# Patient Record
Sex: Female | Born: 1970 | Race: White | Hispanic: No
Health system: Western US, Academic
[De-identification: ages and names within clinical notes are randomized; demographics above are authoritative.]

## PROBLEM LIST (undated history)

## (undated) DIAGNOSIS — T8859XA Other complications of anesthesia, initial encounter: Secondary | ICD-10-CM

## (undated) DIAGNOSIS — Z5189 Encounter for other specified aftercare: Secondary | ICD-10-CM

## (undated) DIAGNOSIS — S88912A Complete traumatic amputation of left lower leg, level unspecified, initial encounter: Secondary | ICD-10-CM

## (undated) DIAGNOSIS — E079 Disorder of thyroid, unspecified: Secondary | ICD-10-CM

## (undated) DIAGNOSIS — G4733 Obstructive sleep apnea (adult) (pediatric): Secondary | ICD-10-CM

## (undated) DIAGNOSIS — I2699 Other pulmonary embolism without acute cor pulmonale: Secondary | ICD-10-CM

## (undated) DIAGNOSIS — F32A Depression, unspecified: Secondary | ICD-10-CM

## (undated) DIAGNOSIS — I10 Essential (primary) hypertension: Secondary | ICD-10-CM

## (undated) DIAGNOSIS — S88911A Complete traumatic amputation of right lower leg, level unspecified, initial encounter: Secondary | ICD-10-CM

## (undated) DIAGNOSIS — T4145XA Adverse effect of unspecified anesthetic, initial encounter: Secondary | ICD-10-CM

## (undated) DIAGNOSIS — C801 Malignant (primary) neoplasm, unspecified: Secondary | ICD-10-CM

## (undated) DIAGNOSIS — G709 Myoneural disorder, unspecified: Secondary | ICD-10-CM

## (undated) DIAGNOSIS — F329 Major depressive disorder, single episode, unspecified: Secondary | ICD-10-CM

## (undated) DIAGNOSIS — Z87898 Personal history of other specified conditions: Secondary | ICD-10-CM

## (undated) HISTORY — DX: Myoneural disorder, unspecified: G70.9

## (undated) HISTORY — DX: Complete traumatic amputation of left lower leg, level unspecified, initial encounter: S88.912A

## (undated) HISTORY — DX: Obstructive sleep apnea (adult) (pediatric): G47.33

## (undated) HISTORY — DX: Essential (primary) hypertension: I10

## (undated) HISTORY — DX: Other pulmonary embolism without acute cor pulmonale: I26.99

## (undated) HISTORY — PX: BRAIN SURGERY: SHX531

## (undated) HISTORY — DX: Encounter for other specified aftercare: Z51.89

## (undated) HISTORY — DX: Complete traumatic amputation of right lower leg, level unspecified, initial encounter: S88.911A

## (undated) HISTORY — DX: Disorder of thyroid, unspecified: E07.9

## (undated) HISTORY — DX: Personal history of other specified conditions: Z87.898

---

## 2000-07-18 ENCOUNTER — Other Ambulatory Visit: Admission: RE | Admit: 2000-07-18 | Discharge: 2000-07-18 | Payer: Self-pay | Admitting: Family Medicine

## 2001-07-22 ENCOUNTER — Other Ambulatory Visit: Admission: RE | Admit: 2001-07-22 | Discharge: 2001-07-22 | Payer: Self-pay | Admitting: Family Medicine

## 2002-08-11 ENCOUNTER — Other Ambulatory Visit: Admission: RE | Admit: 2002-08-11 | Discharge: 2002-08-11 | Payer: Self-pay | Admitting: Family Medicine

## 2004-03-22 ENCOUNTER — Ambulatory Visit: Payer: Self-pay | Admitting: Family Medicine

## 2004-10-24 ENCOUNTER — Ambulatory Visit: Payer: Self-pay | Admitting: Family Medicine

## 2006-04-08 DIAGNOSIS — Z5189 Encounter for other specified aftercare: Secondary | ICD-10-CM

## 2006-04-08 HISTORY — DX: Encounter for other specified aftercare: Z51.89

## 2006-04-08 HISTORY — PX: ABOVE KNEE LEG AMPUTATION: SUR20

## 2007-04-09 DIAGNOSIS — I2699 Other pulmonary embolism without acute cor pulmonale: Secondary | ICD-10-CM

## 2007-04-09 HISTORY — DX: Other pulmonary embolism without acute cor pulmonale: I26.99

## 2009-04-08 DIAGNOSIS — G4733 Obstructive sleep apnea (adult) (pediatric): Secondary | ICD-10-CM

## 2009-04-08 HISTORY — DX: Obstructive sleep apnea (adult) (pediatric): G47.33

## 2013-04-23 ENCOUNTER — Encounter (HOSPITAL_BASED_OUTPATIENT_CLINIC_OR_DEPARTMENT_OTHER): Payer: Self-pay | Admitting: Otolaryngology

## 2013-04-23 ENCOUNTER — Ambulatory Visit (HOSPITAL_BASED_OUTPATIENT_CLINIC_OR_DEPARTMENT_OTHER): Payer: No Typology Code available for payment source | Attending: Otolaryngology | Admitting: Otolaryngology

## 2013-04-23 VITALS — BP 131/90 | HR 91 | Temp 97.9°F | Resp 16 | Ht 63.0 in | Wt 145.0 lb

## 2013-04-23 DIAGNOSIS — J329 Chronic sinusitis, unspecified: Secondary | ICD-10-CM | POA: Insufficient documentation

## 2013-04-23 DIAGNOSIS — R5381 Other malaise: Secondary | ICD-10-CM

## 2013-04-23 DIAGNOSIS — R5383 Other fatigue: Secondary | ICD-10-CM | POA: Insufficient documentation

## 2013-04-23 NOTE — Progress Notes (Addendum)
Consultation requested by: Calvert Cantor, ARNP  Reason for consultation:  C/f thyroglossal duct cyst  Chief Complaint   Patient presents with   . Throat Problem     thryoglossal lesion     I appreciate the opportunity to be involved in this patient's care.    HISTORY OF PRESENT ILLNESS:  This is a 43 yo female with multiple ENT related complaints.  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  Patient endorses occasional sore throats with very mild dysphagia.   States she has sore throat once every 6-8 wks but they are general mild and non-disruptive in her life.  Further, they resolve spontaneously.      Patient also complains of 9 months of symptoms consistent with obstructive sleep apnea.  She states that she has poor sleep including difficulty falling and staying asleep.  Her husband states that she snores and often has witnessed apneic events.  She states that her sleep is non-restorative.      ROS: positive for right ear fullness with vertigo and occasional pain, posterior neck mass not seen on CT imaging, occasional nasal congestion,       Past Medical History:  Past Medical History   Diagnosis Date   . History of headache        Medications:  No outpatient prescriptions prior to visit.     No facility-administered medications prior to visit.       Social History:  History   Substance Use Topics   . Smoking status: Never Smoker    . Smokeless tobacco: Never Used   . Alcohol Use: Not on file       Family History:  No family history on file.   Otherwise negative in relation to the chief complaint.    I have reviewed the patient's recorded medical history including the ROS, past medical history, past surgical history, family history and social history and confirmed these with the patient.    REVIEW OF SYSTEMS:  A complete review of systems was performed.  Pertinent positives are noted in the HPI above and/or on today's patient questionnaire, with all other systems negative in  relation to the chief complaint.    PHYSICAL EXAMINATION:  Vital Signs:  Blood pressure 131/90, pulse 91, temperature 97.9 F (36.6 C), temperature source Temporal, resp. rate 16, height 5\' 3"  (1.6 m), weight 145 lb (65.772 kg), SpO2 100.00%.  General:  Well-looking individual in no apparent distress.    Eyes:  Pupils equal and reactive to light.  Normal extraocular muscle movements.    ENT:  Tympanic membranes and external auditory canals are normal bilaterally.  Oral cavity and oropharynx examination demonstrates no mucosal lesions or masses.  Anterior rhinoscopy demonstrates normal mucosa and no lesions.  Lymphatic:  No neck adenopathy noted.  Respiratory:  No respiratory distress.  No audible stridor nor wheeze.    Musculoskeletal:  Normocephalic.   Neurologic:  Screening cranial nerve examination for nerves II-XII within normal limits.   Psychiatric:  Patient alert and oriented in all 3 domains.  Appropriate mood and affect.    DIAGNOSTIC STUDIES:  PROCEDURE:  Flexible fiberoptic nasal laryngoscopy  Indication: OSA, TGC  Operator:  Dr. Francene Finders, MD, Dr. Donnamae Jude, MD  Findings:   Middle meati and sphenoethmoidal recesses clear of polyps or pus bilaterally.  No nasopharyngeal, oropharyngeal or hypopharyngeal lesions or masses seen.    Vocal folds are mobile with no vocal fold lesions.  Description of Procedure:  The procedure was explained to the patient who verbally agreed to proceed.  Afrin and lidocaine 4% were used topically for anaesthesia.  The endoscope was passed atraumatically through the nose with the findings as described.  The scope was gently withdrawn.  The patient tolerated the procedure well without complications.  The findings were reviewed with the patient and their questions were answered.    ASSESSMENT:  1. 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  Patient was told that lesion seen on CT may also be consistent with necrotic delphine node  2.  Disruptive Sleep - patient reports 9  months of snoring, non-restorative sleep, witnessed apneic events.  This is quite concerning for OSA.   3. Multiple other benign Heck & Neck complains - includes vertigo, otalgia, nasal congestion, posterior neck mass and dysphagia    Plan:   1. Radiology overread to confirm radiographic lesion is TGC and not necrotic node  2. Refer to sleep medicine for work up of OSA  3. RTC for evaluation of other H&N complaints.    Patient seen and discussed with Dr. Debbrah AlarWhipple    -------------------------------------------  Attending: Cornelia CopaMark Eliot Whipple, MD  I saw and evaluated the patient and agree with Dr. Lamonte RicherLaw's note.   Additional diagnoses: I was present for the entire procedure of flexible fiberoptic nasal endoscopy.  ----------------------------------------

## 2013-04-23 NOTE — Patient Instructions (Addendum)
It was a pleasure to see you today.    To recap, we discussed your thyroglossal duct cyst and previous radiology studies.  We planned to refer you to a sleep doctor and review your images with a radiologist.     Please remember to refer to the handouts you were given to clinic today as needed.    Please feel free to contact the office if you have any questions or concerns.    Also, please feel free to contact us and return sooner if any of your symptoms worsen.    I appreciate the opportunity to be involved in your care.     This note was generated with voice recognition software. While efforts were made correct typographical errors, some errors may have been missed.    ___

## 2013-04-27 ENCOUNTER — Telehealth (HOSPITAL_BASED_OUTPATIENT_CLINIC_OR_DEPARTMENT_OTHER): Payer: Self-pay

## 2013-04-27 NOTE — Telephone Encounter (Signed)
CONFIRMED PHONE NUMBER:   Telephone Information:   Home Phone [phone]   Work Phone [phone]   Mobile [phone]       CALLERS FIRST AND LAST NAME: Christin Fudgeina M Mamula    FACILITY NAME: N/A TITLE: N/a  CALLERS RELATIONSHIP:Self  RETURN CALL: Detailed message on voicemail only     SUBJECT: Appointment Request   REASON FOR REQUEST: per patient    REQUEST APPOINTMENT WITH: any  REFERRING PROVIDER: Dr. Conni ElliotLaw  REQUESTED DATE: 1/23 preferred since she doesn't like close or try and match up with other appointments she has  REQUESTED TIME: any  UNABLE TO APPOINT: Other: no scheduling notes in referral

## 2013-04-28 DIAGNOSIS — Q892 Congenital malformations of other endocrine glands: Secondary | ICD-10-CM

## 2013-04-30 ENCOUNTER — Ambulatory Visit (HOSPITAL_BASED_OUTPATIENT_CLINIC_OR_DEPARTMENT_OTHER): Payer: No Typology Code available for payment source | Attending: Audiologist | Admitting: Audiologist

## 2013-04-30 DIAGNOSIS — R42 Dizziness and giddiness: Secondary | ICD-10-CM

## 2013-04-30 DIAGNOSIS — H9319 Tinnitus, unspecified ear: Secondary | ICD-10-CM | POA: Insufficient documentation

## 2013-04-30 NOTE — Progress Notes (Signed)
REASON FOR VISIT   Hearing evaluation.    SUBJECTIVE  This is an abbreviated note. Jacqueline Terrell, 42 yrs, female, was referred by Dr. Francene FindersMark Whipple, MD. for an audiogram. Please see Dr. Erlene QuanWhipple's note for complete case history.     Hearing loss: No    Tinnitus:Yes, non ear specific, not bothersome but noticeable when quiet  Dizziness / Vertigo / Imbalance: Yes; episodic but increasing in frequency; first episode was severe upon waking in bed 3-4 years ago; also veers to the left at times  Aural Fullness: Yes, left more recently  Otogenic Pain: Sometimes, indicated behind left ear  Family History of Hearing Loss: No    OBJECTIVE/ASSESSMENT  Otoscopic Inspection:   Right Ear: Clear EAC, visualized TM  Left Ear: Clear EAC, visualized TM    Tympanometry:  Right Ear: Normal peak pressure and compliance  Left Ear: Normal peak pressure and compliance     Audiometry: See Epic under media tab for details  Right Ear: Hearing is within normal limits 250-8kHz  Left Ear: Hearing is within normal limits 250-8kHz    Speech Discrimination (Word Recognition):  Right Ear:  100% words correct at 75 dBHL, with contralateral masking  Left Ear:  96% words correct at 75 dBHL, with contralateral masking    PLAN  Patient to be seen by  Dr. Francene FindersMark Whipple, MD. May 07, 2013.  Discussed results with patient and spouse.

## 2013-05-03 ENCOUNTER — Ambulatory Visit: Payer: No Typology Code available for payment source

## 2013-05-07 ENCOUNTER — Ambulatory Visit (HOSPITAL_BASED_OUTPATIENT_CLINIC_OR_DEPARTMENT_OTHER): Payer: No Typology Code available for payment source | Attending: Physician Assistant | Admitting: Otolaryngology

## 2013-05-07 ENCOUNTER — Encounter (HOSPITAL_BASED_OUTPATIENT_CLINIC_OR_DEPARTMENT_OTHER): Payer: Self-pay | Admitting: Otolaryngology

## 2013-05-07 VITALS — BP 126/82 | HR 91 | Temp 97.3°F | Resp 20 | Ht 63.0 in | Wt 157.0 lb

## 2013-05-07 DIAGNOSIS — R42 Dizziness and giddiness: Secondary | ICD-10-CM | POA: Insufficient documentation

## 2013-05-07 DIAGNOSIS — R0989 Other specified symptoms and signs involving the circulatory and respiratory systems: Secondary | ICD-10-CM

## 2013-05-07 DIAGNOSIS — Q892 Congenital malformations of other endocrine glands: Secondary | ICD-10-CM | POA: Insufficient documentation

## 2013-05-07 DIAGNOSIS — R198 Other specified symptoms and signs involving the digestive system and abdomen: Secondary | ICD-10-CM

## 2013-05-07 DIAGNOSIS — F458 Other somatoform disorders: Secondary | ICD-10-CM
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
## 2013-05-13 ENCOUNTER — Encounter (HOSPITAL_BASED_OUTPATIENT_CLINIC_OR_DEPARTMENT_OTHER): Payer: Self-pay | Admitting: Pulmonary Disease

## 2013-05-13 ENCOUNTER — Ambulatory Visit (HOSPITAL_BASED_OUTPATIENT_CLINIC_OR_DEPARTMENT_OTHER): Payer: No Typology Code available for payment source | Attending: Pulmonary Disease | Admitting: Pulmonary Disease

## 2013-05-13 VITALS — BP 124/84 | HR 91 | Temp 96.8°F | Resp 16 | Ht 63.0 in | Wt 155.2 lb

## 2013-05-13 DIAGNOSIS — Q892 Congenital malformations of other endocrine glands: Secondary | ICD-10-CM | POA: Insufficient documentation

## 2013-05-13 DIAGNOSIS — J302 Other seasonal allergic rhinitis: Secondary | ICD-10-CM | POA: Insufficient documentation

## 2013-05-13 DIAGNOSIS — R0609 Other forms of dyspnea: Secondary | ICD-10-CM | POA: Insufficient documentation

## 2013-05-13 DIAGNOSIS — R5381 Other malaise: Secondary | ICD-10-CM | POA: Insufficient documentation

## 2013-05-13 DIAGNOSIS — R0989 Other specified symptoms and signs involving the circulatory and respiratory systems: Secondary | ICD-10-CM | POA: Insufficient documentation

## 2013-05-13 DIAGNOSIS — R5383 Other fatigue: Secondary | ICD-10-CM | POA: Insufficient documentation

## 2013-05-13 DIAGNOSIS — R0683 Snoring: Secondary | ICD-10-CM
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
## 2013-05-13 NOTE — Patient Instructions (Signed)
We will make arrangements for a home sleep study. If this is diagnostic for sleep apnea, we will contact you to make arrangements for a CPAP trial. If it does not show sleep apnea, we would repeat the home test and possibly follow-up with an sleep lab study if needed.

## 2013-05-14 ENCOUNTER — Telehealth (HOSPITAL_BASED_OUTPATIENT_CLINIC_OR_DEPARTMENT_OTHER): Payer: Self-pay | Admitting: Pulmonary Disease

## 2013-05-14 NOTE — Telephone Encounter (Signed)
Forwarded to Sleep Tech.

## 2013-05-14 NOTE — Telephone Encounter (Signed)
CONFIRMED PHONE NUMBER: [phone]  CALLERS FIRST AND LAST NAME: Christin Fudgeina M Alipio  FACILITY NAME: na TITLE: na  CALLERS RELATIONSHIP:Self  RETURN CALL: OK to leave detailed message with anyone that answers     SUBJECT: General Message   REASON FOR REQUEST: Insurance Verification and Pre-Authorizations      MESSAGE:  Patient called to try and verify information, CCR re-ran and it is still showing as inactive. Patient also states she spoke with insurance company and the effective date is 04/08/2013 and that her at home and in office sleep studies need pre-authorizations. Please call back, Thank You!

## 2013-05-14 NOTE — Telephone Encounter (Signed)
Patient returning call. Please follow up with patient. Thank you.

## 2013-05-14 NOTE — Addendum Note (Signed)
Addended by: Francene FindersWHIPPLE, MARK ELIOT on: 05/14/2013 08:52 AM     Modules accepted: Orders, Level of Service

## 2013-05-17 NOTE — Telephone Encounter (Signed)
Called Pt and told her the phone number to pre-reg to see if they could help her with her insurance issue, and told her I would follow up as well. I called Pre[employer] and explained the situation to Wynona CanesChristine who is forwarding Ms Goyne's info to EEV team.

## 2013-05-27 ENCOUNTER — Ambulatory Visit (HOSPITAL_BASED_OUTPATIENT_CLINIC_OR_DEPARTMENT_OTHER): Payer: No Typology Code available for payment source | Attending: Pulmonary Disease

## 2013-05-27 DIAGNOSIS — R0609 Other forms of dyspnea: Secondary | ICD-10-CM | POA: Insufficient documentation

## 2013-05-27 DIAGNOSIS — G4733 Obstructive sleep apnea (adult) (pediatric): Secondary | ICD-10-CM

## 2013-05-27 DIAGNOSIS — R0989 Other specified symptoms and signs involving the circulatory and respiratory systems: Secondary | ICD-10-CM | POA: Insufficient documentation

## 2013-06-03 NOTE — Addendum Note (Signed)
Addended by: Judeth CornfieldBILLINGS, MARTHA ELIZABETH on: 06/03/2013 04:53 PM     Modules accepted: Orders

## 2013-06-08 ENCOUNTER — Telehealth (HOSPITAL_BASED_OUTPATIENT_CLINIC_OR_DEPARTMENT_OTHER): Payer: Self-pay

## 2013-06-08 NOTE — Telephone Encounter (Signed)
CONFIRMED PHONE NUMBER: [phone]  CALLERS FIRST AND LAST NAME: Jacqueline Terrell  FACILITY NAME: / TITLE: /  CALLERS RELATIONSHIP:Self  RETURN CALL: Detailed message on voicemail only     SUBJECT: General Message   REASON FOR REQUEST: Patient called to inform your clinic that she is still waiting for a technician from your clinic to discuss the test results and to set up second Sleep Study test. Please contact patient as soon as possible. Thanks!    MESSAGE: Please see above.

## 2013-06-09 NOTE — Telephone Encounter (Signed)
Call to pt and 05/27/13 HSAT results relayed :  INTERPRETATION:    This unattended portable monitor is not diagnostic of obstructive sleep apnea.      RECOMMENDATIONS:    Given our suspicion for mild OSA/upper airway resistance syndrome, I will continue to evaluate her.  Her insurance mandates another HSAT prior to in-lab.  I suspect this will also be non-diagnostic but this is required.     Pt verbalizes understanding

## 2013-06-16 ENCOUNTER — Ambulatory Visit (HOSPITAL_BASED_OUTPATIENT_CLINIC_OR_DEPARTMENT_OTHER): Payer: No Typology Code available for payment source | Attending: Pulmonary Disease

## 2013-06-16 DIAGNOSIS — R0989 Other specified symptoms and signs involving the circulatory and respiratory systems: Secondary | ICD-10-CM | POA: Insufficient documentation

## 2013-06-16 DIAGNOSIS — R0609 Other forms of dyspnea: Secondary | ICD-10-CM | POA: Insufficient documentation

## 2013-06-16 DIAGNOSIS — G4733 Obstructive sleep apnea (adult) (pediatric): Secondary | ICD-10-CM

## 2013-06-17 ENCOUNTER — Other Ambulatory Visit (HOSPITAL_BASED_OUTPATIENT_CLINIC_OR_DEPARTMENT_OTHER): Payer: Self-pay | Admitting: Pulmonary Disease

## 2013-06-17 DIAGNOSIS — G4733 Obstructive sleep apnea (adult) (pediatric): Secondary | ICD-10-CM

## 2013-06-28 ENCOUNTER — Telehealth (HOSPITAL_BASED_OUTPATIENT_CLINIC_OR_DEPARTMENT_OTHER): Payer: Self-pay

## 2013-06-28 NOTE — Telephone Encounter (Signed)
CONFIRMED PHONE NUMBER:   Telephone Information:   Home Phone Not on file.   Work Phone [phone]   Mobile [phone]       CALLERS FIRST AND LAST NAME: Jacqueline Terrell    FACILITY NAME:   TITLE:    CALLERS RELATIONSHIP:Self  RETURN CALL: OK to leave detailed message with anyone that answers     SUBJECT: General Message   REASON FOR REQUEST: Patient had surgery for ablation at [facility] last week and had concerns about low oxygen level in the 50 when a sleep     MESSAGE: Refer to above

## 2013-06-30 NOTE — Telephone Encounter (Signed)
Call from pt stating she had a tubal ligation at [facility] last week and was kept inpt for 2 additional days due to sleep apnea ." They had me wear a CPAP and said I would want to tell my Dr at the Sleep Clinic ."  Pt states her SPO2 was in the 50s while she slept . " I was on narcotics ,so the last night I was there I didn't take any . They said the last night my sleep alarm went off only 1 time and my husband said I snored like crazy on Sunday night."    Pt is scheduled for a sleep study on 07/17/13. [facility] if [facility] is any sooner availability . Pt rescheduled for tomorrow night. Pt verbalizes understanding and appreciation.   Call placed to [facility] record dept ,records requested,received and forwarded to Dr Troy SineBillings for review.

## 2013-07-01 ENCOUNTER — Ambulatory Visit (HOSPITAL_BASED_OUTPATIENT_CLINIC_OR_DEPARTMENT_OTHER): Payer: No Typology Code available for payment source | Attending: Pulmonary Disease

## 2013-07-01 DIAGNOSIS — G4733 Obstructive sleep apnea (adult) (pediatric): Secondary | ICD-10-CM | POA: Insufficient documentation

## 2013-07-08 ENCOUNTER — Encounter (INDEPENDENT_AMBULATORY_CARE_PROVIDER_SITE_OTHER): Payer: Self-pay | Admitting: General Surgery

## 2013-07-08 ENCOUNTER — Other Ambulatory Visit (INDEPENDENT_AMBULATORY_CARE_PROVIDER_SITE_OTHER): Payer: Self-pay | Admitting: General Surgery

## 2013-07-08 ENCOUNTER — Encounter (INDEPENDENT_AMBULATORY_CARE_PROVIDER_SITE_OTHER): Payer: Self-pay

## 2013-07-08 ENCOUNTER — Ambulatory Visit (INDEPENDENT_AMBULATORY_CARE_PROVIDER_SITE_OTHER): Payer: Medicare Other | Admitting: General Surgery
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
## 2013-07-08 NOTE — Patient Instructions (Signed)
Congratulations on starting your journey to a healthier life! Over the next few weeks you will be undergoing tests (x-rays and labs) and seeing specialists to help evaluate you for weight loss surgery.  These tests and consultations with a psychologist and nutritionist are needed to prepare you for the lifestyle changes that lie ahead and are often required by insurance companies to approve you for surgery.   Pathway to Surgery:  Over the next few weeks -->Lab work -->Radiology tests   - Chest x-ray - make sure your lungs are normal before surgery  - Upper GI - you drink barium and pictures are taken as it travels down your  esophagus and into your stomach - looks for reflux and a hiatal hernia which may  need to repaired at the same time as your weight loss surgery  - Abdominal Ultrasound - looks at your gallbladder and liver  - Mammogram - up to date mammogram if you are a female -->EKG  -->Sleep study - if you are felt to be at high risk for obstructive sleep apnea - talk with your PCP about your sleep apnea. CPAP will decrease your risks and make surgery safter -->H. Pylori breath test (BreathTek) - you surgeon may order this test to see if you have  a bacteria (H pylori) in your stomach which makes you at higher risk to develop a  ulcer or inflammation of your stomach -->Nutrition consultation -->Psychologist consultation -->Other specialist consults - your surgeon may determine that you need to see a  specialist like a cardiologist or pulmnologist depending on your health history -->Watch EMMI video about your planned weight loss surgery -->you can look at www.[email or web address] to learn more about weight loss surgery and  compare surgery outcomes -->you can look at our new website - [email or web address] - available mid-April 2015  Two weeks prior to surgery  Go on the extremely low carb liquid diet - this will decrease the size of your liver  which will make surgery safer - the nutritionist  will go over this at a later date  Attend preoperative appointment with your surgeon  Attend preoperative surgery class  One week prior to surgery  No aspirin products.  Tylenol is acceptable   24 hours prior to surgery  No alcoholic beverages  Report fever greater than 100.5 or excessive nasal drainage suggesting infection  Continue bariatric preop diet  Perform bowel prep if ordered  Do not eat or drink anything after midnight the night before surgery  Do not take any medications except those instructed by the anesthesiologist  Morning of surgery  Please arrive at the hospital at least 2 hours before your scheduled surgery time.  No makeup, fingernail polish or jewelry  Bring insurance cards with you  Bring your CPAP mask if you use this

## 2013-07-08 NOTE — Addendum Note (Signed)
Addended by: Redmond Pulling Deyna Carbon M on: 07/08/2013 06:14 PM   Modules accepted: Orders

## 2013-07-12 ENCOUNTER — Telehealth (HOSPITAL_BASED_OUTPATIENT_CLINIC_OR_DEPARTMENT_OTHER): Payer: Self-pay | Admitting: Pulmonary Disease

## 2013-07-12 NOTE — Telephone Encounter (Signed)
CALLED PATIENT BACK AND LETTING HER KNOW THAT IT WOULD BE THE FIFE/[facility] WILL BE CONTACTING HER FOR THE SETUP AND SHE UNDERSTAND    SHE ALSO WISHES TO SPEAK TO OUR NURSE TO DISCUSS SLEEP STUDY RESULTS

## 2013-07-12 NOTE — Telephone Encounter (Signed)
Call to pt who is advised of 07/01/13 PSG results:    INTERPRETATION:    Obstructive sleep apnea which is mild in severity with an apnea hypopnea index of 7 and total RDI of 18.  She had no significant desaturation but mild to moderate sleep disruption.    RECOMMENDATIONS:    I will have the patient start CPAP therapy for her OSA, given her symptoms of nonrestorative sleep and daytime sleepiness.    Pt advised she will be contacted by a DME and an appt will be scheduled with them to get CPAP and instructions.  She will f/u in clinic 6 weeks after she starts using PAP to see provider and bring machine with her . Pt verbalizes understanding

## 2013-07-12 NOTE — Telephone Encounter (Signed)
CONFIRMED PHONE NUMBER: [phone]  CALLERS FIRST AND LAST NAME: Jacqueline Terrell    FACILITY NAME: na TITLE: na  CALLERS RELATIONSHIP:Self  RETURN CALL: Detailed message on voicemail only     SUBJECT: General Message   REASON FOR REQUEST: MEdical Supplies    MESSAGE: Pt got a letter for her CPAP machine for a company in [place], she wants to know if there is a closer company to her.  She Prefers [place].  Pt also has a couple of questions about the CPAP machine

## 2013-07-17 ENCOUNTER — Other Ambulatory Visit (HOSPITAL_BASED_OUTPATIENT_CLINIC_OR_DEPARTMENT_OTHER): Payer: No Typology Code available for payment source

## 2013-07-19 ENCOUNTER — Other Ambulatory Visit (INDEPENDENT_AMBULATORY_CARE_PROVIDER_SITE_OTHER): Payer: Self-pay | Admitting: General Surgery

## 2013-07-22 ENCOUNTER — Telehealth (HOSPITAL_BASED_OUTPATIENT_CLINIC_OR_DEPARTMENT_OTHER): Payer: Self-pay | Admitting: Pulmonary Disease

## 2013-07-22 NOTE — Telephone Encounter (Signed)
Sent Rx to [facility]. LVM to Ms. Wolfley.

## 2013-07-22 NOTE — Telephone Encounter (Signed)
CONFIRMED PHONE NUMBER: [phone]  CALLERS FIRST AND LAST NAME: Louanna Rawina Marie Worster  FACILITY NAME: n/a TITLE: n/a  CALLERS RELATIONSHIP:Self  RETURN CALL: General message OK     SUBJECT: General Message   REASON FOR REQUEST: medical supply follow up- patient was advised that an order for a CPAP machine would be called into [facility] but she had advised she'd rather have a medical supply company close to her home- she hasn't been contacted since, by the clinic or a medical supply company    MESSAGE: please call the patient to discuss as soon as possible. Thank you

## 2013-07-27 ENCOUNTER — Ambulatory Visit (HOSPITAL_COMMUNITY)
Admission: RE | Admit: 2013-07-27 | Discharge: 2013-07-27 | Disposition: A | Discharge status: Home / Self Care | Payer: Medicare Other | Source: Ambulatory Visit | Attending: General Surgery | Admitting: General Surgery

## 2013-07-27 ENCOUNTER — Other Ambulatory Visit: Payer: Self-pay

## 2013-07-27 DIAGNOSIS — F329 Major depressive disorder, single episode, unspecified: Secondary | ICD-10-CM | POA: Insufficient documentation

## 2013-07-27 DIAGNOSIS — Z86711 Personal history of pulmonary embolism: Secondary | ICD-10-CM | POA: Insufficient documentation

## 2013-07-27 DIAGNOSIS — I10 Essential (primary) hypertension: Secondary | ICD-10-CM | POA: Insufficient documentation

## 2013-07-27 DIAGNOSIS — Z6841 Body Mass Index (BMI) 40.0 and over, adult: Secondary | ICD-10-CM | POA: Insufficient documentation

## 2013-07-27 DIAGNOSIS — F3289 Other specified depressive episodes: Secondary | ICD-10-CM | POA: Insufficient documentation

## 2013-07-27 DIAGNOSIS — E039 Hypothyroidism, unspecified: Secondary | ICD-10-CM | POA: Insufficient documentation

## 2013-07-27 DIAGNOSIS — K449 Diaphragmatic hernia without obstruction or gangrene: Secondary | ICD-10-CM | POA: Insufficient documentation

## 2013-07-27 DIAGNOSIS — K7689 Other specified diseases of liver: Secondary | ICD-10-CM | POA: Insufficient documentation

## 2013-07-27 DIAGNOSIS — G4730 Sleep apnea, unspecified: Secondary | ICD-10-CM | POA: Insufficient documentation

## 2013-08-17 ENCOUNTER — Encounter: Payer: Medicare Other | Attending: General Surgery | Admitting: Dietician

## 2013-08-17 ENCOUNTER — Encounter: Payer: Self-pay | Admitting: Dietician

## 2013-08-17 DIAGNOSIS — Z01812 Encounter for preprocedural laboratory examination: Secondary | ICD-10-CM | POA: Insufficient documentation

## 2013-08-17 DIAGNOSIS — Z713 Dietary counseling and surveillance: Secondary | ICD-10-CM | POA: Insufficient documentation

## 2013-08-17 NOTE — Progress Notes (Signed)
  Pre-Op Assessment Visit:  Pre-Operative Gastric sleeve Surgery  Medical Nutrition Therapy:  Appt start time: 3009   End time:  1200.  Patient was seen on 08/17/2013 for Pre-Operative Gastric sleeve Nutrition Assessment. Assessment and letter of approval faxed to [facility] Bariatric Surgery Program coordinator on 08/17/2013.   Preferred Learning Style:   No preference indicated   Learning Readiness:  Ready  Handouts given during visit include:  Pre-Op Goals Bariatric Surgery Protein Shakes  Teaching Method Utilized:  Visual Auditory Hands on  Barriers to learning/adherence to lifestyle change: physical limitations  Demonstrated degree of understanding via:  Teach Back   Patient to call the [facility] to enroll in Pre-Op and Post-Op Nutrition Education when surgery date is scheduled.

## 2013-09-02 ENCOUNTER — Ambulatory Visit: Payer: Medicare Other | Admitting: Dietician

## 2013-09-10 ENCOUNTER — Telehealth (HOSPITAL_BASED_OUTPATIENT_CLINIC_OR_DEPARTMENT_OTHER): Payer: Self-pay

## 2013-09-10 NOTE — Telephone Encounter (Signed)
Call from pt to advise " I am having a total hysterectomy on June 18th and the [facility] person said I should ask if she wants to change my settings. I had a tubal ligation on 07/26/13 and my oxygen was going below 60 so I dont know if she wants to change it. I am also supposed to have my 6 week f/u now but forgot to schedule it a while ago so I need to do that."    Pt advised I will forward her question to Dr Troy Sine and she is forwarded to [facility] for scheduling .

## 2013-09-14 NOTE — Telephone Encounter (Signed)
Her PSGs (home and in-lab studies) have all shown very minimal desaturation (93% nadir, AHI 7), so I am surprised by the report of 60% desaturation.  Perhaps she is very sensitive to narcotics.  I do not think she needs her APAP changed empirically, but should use her CPAP post-operatively and alert the nursing staff and anesthesia that she seems to be sensitive to narcotics/anesthesia medications with her breathing.  She may benefit from CPAP and oxygen.

## 2013-09-15 NOTE — Telephone Encounter (Signed)
Call to pt and vm left advising her of providers statements below . Pt invited to call clinic with questions or concerns. Pts answering machine identifies this as her phone by name .

## 2013-10-14 ENCOUNTER — Encounter (HOSPITAL_BASED_OUTPATIENT_CLINIC_OR_DEPARTMENT_OTHER): Payer: Self-pay | Admitting: Internal Medicine

## 2013-10-14 ENCOUNTER — Ambulatory Visit (HOSPITAL_BASED_OUTPATIENT_CLINIC_OR_DEPARTMENT_OTHER): Payer: No Typology Code available for payment source | Attending: Pulmonary Disease | Admitting: Internal Medicine

## 2013-10-14 VITALS — BP 128/86 | HR 81 | Wt 162.0 lb

## 2013-10-14 DIAGNOSIS — J302 Other seasonal allergic rhinitis: Secondary | ICD-10-CM

## 2013-10-14 DIAGNOSIS — J342 Deviated nasal septum: Secondary | ICD-10-CM | POA: Insufficient documentation

## 2013-10-14 DIAGNOSIS — J309 Allergic rhinitis, unspecified: Secondary | ICD-10-CM | POA: Insufficient documentation

## 2013-10-14 DIAGNOSIS — Z9989 Dependence on other enabling machines and devices: Secondary | ICD-10-CM

## 2013-10-14 DIAGNOSIS — G4733 Obstructive sleep apnea (adult) (pediatric): Secondary | ICD-10-CM | POA: Insufficient documentation

## 2013-10-14 NOTE — Patient Instructions (Signed)
You have sleep apnea and CPAP is the most effective therapy.  We will refer you to see ENT for possible therapy for nasal congestion.  Today we made changes to your CPAP: none    Until your next visit:  Please continue to use your CPAP as directed, wearing it every time you sleep for 4 hours per night at the minimum, ideally 6-8 hours per night.    Follow-up:  With Dr. Brayton CavesBillings/He in 6 months, sooner if needed.    Consider signing up for [email or web address] ([email or web address]://[email or web address]).   It is an Arts development officeron-line tool and community for patients, researchers and sleep providers to help advance research on sleep apnea.    Please bring your CPAP machine with you to your next appointment. If you are unable to do this, please bring in the smart card.    If you have any questions or concerns, please contact the Call Center at [phone].    Thank you for coming in today. During today's visit, we reviewed only your sleep related medications. Please follow up with your Primary Care Provider for any questions related to your other medications.

## 2013-10-14 NOTE — Progress Notes (Signed)
SLEEP CENTER FOLLOW-UP VISIT    REFERRING PROVIDER: Dr. Loraine Leriche Terrell  PCP: Dr. Ellis Terrell ([facility], [facility])    CC  Jacqueline Terrell is a 43 year old female is here for follow-up for obstructive sleep apnea and PAP adherence.    INTERVAL HISTORY  The patient's last clinic visit was 05/13/13 with Dr. Quintella Terrell [facility] Fellow) and Dr. Troy Terrell.  She had HSAT that was nondiagnostic with AHI of 2.1.  Due to insurance requirement, another HSAT was repeated which was also nondiagnostic with AHI of 1.6.  In lab study shows 90 percent sleep efficiency, latency 12 minutes, REM latency 129 minutes, relatively normal sleep architecture, arousal index 18 due to respiratory events.  AHI of 7 with RERA index of 11.  Fully supine sleep.  More events during REM.  Mean sat 96% with nadir of 93%.  PLMI of 4.  She was prescribed CPAP due to nonrestorative sleep and daytime sleepiness.     She had a hysterectomy due to chronic pelvic pain.  Ovaries were left intact.  This was done on 09/23/13.  She used her CPAP while in the hospital.  This time the oxygen levels did not drop.  She is very sensitive with hypoxemia from narcotics and anesthesia.      - Overall health without change, otherwise doing well.  - Since the last visit patient, has been having issues with PAP therapy.  She tries her best to keep it on.  Issues with sinus headache and stuffiness with pressure awareness going into the nose (perhaps due to history of deviated septum to the left).  Feels her sleep quality isn't any better.  She frequent awakenings.  She tries to ignore the mask on her face.  Thinks she wakes up more with the mask.  Not bothered by machine noise.  She recalls waking up with gasping where the mask did not record it as an event and wonders if this is normal.  - Patient denies snoring, gasping or choking with use. No noted air leak. Pressure feels OK. Mask is uncomfortable.  - Sleepiness is unchanged.  - Epworth Sleepiness Scale:6 out of  24 [Baseline: 7]  - Patient denies drowsy driving  - Sleep habits have not changed since initial visit.      - Estimated average nightly sleep duration: 7 hours  - Week/Work day bedtime: 12 - 1 am  - Week/Work day sleep onset latency: 30 - 60 min  - Nighttime awakenings: frequently per night, due to snoring, choking / gasping, restlessness  - Week/Work day wake time: 7:30 - 8 am, with no alarm clock, and is not refreshed  - Differences in sleep schedule on weekends/vacation: Bed time 1 - 2 am, sleep onset latency 30 - 60 min, wake time 8-9 am with no alarm clock, is not refreshed.  - Naps: no    PAP Statistics:  Company: [employer]gallery cur[employer]:  5 - 15 cmH2O with NASAL mask  Usage:  Use on 24 out of last 30 days, mean use of 4 hr 49 min of days used. Utilized the device for >=4hr on  63% of nights.    Pressures/Leak:  Median pressure is 5.9 cmH2O, pressure is <= 7.3 cmH2O 95% of the time.  There is no excessive mask leak.  Residual AHI: 0.1    REVIEW OF SYSTEMS  Sleep:  Unchanged daytime fatigue / sleepiness  Upper airway:  nasal congestion and sinus headache w/o post-nasal drip  Lower airway:  no dyspnea or wheezing  Constitutional:  no pain, fevers, or sweats  Health Maintenance: no weight change  Neurologic: no weakness    PROBLEM LIST  Patient Active Problem List   Diagnosis   . Thyroglossal duct cyst   . Seasonal allergies   . Snoring     ALLERGIES    Sulfa antibiotics and Codeine    MEDICATIONS    Cetirizine 10 mg daily in the morning.  Neomed nasal wash prn  No medicated nasal sprays, doesn't tolerate well    PHYSICAL EXAM  Filed Vitals:    10/14/13 1028   BP: 128/86   Pulse: 81   Weight: 162 lb (73.483 kg)   SpO2: 98%       BP Readings from Last 5 Encounters:   05/13/13 124/84   05/07/13 126/82   04/23/13 131/90      Wt Readings from Last 5 Encounters:   05/13/13 155 lb 3.2 oz (70.398 kg)   05/07/13 157 lb (71.215 kg)   04/23/13 145 lb (65.772 kg)     GEN: Pleasant, appropriate affect, NAD.  HEENT:  Nasal turbinates 3+ bilateral with some secretions and hypervascularity, leftward deviation of nasal septum, modified Mallampati score of 3, tonsils 2+.  CV: RRR, no m/r/g.  No peripheral edema.  RESP: Normal WOB, lungs CTAB  ABD: SNTND  EXT: No c/c  NEURO: Alert, appropriate, speech fluent. MAEW, normal rapid alternating movements, normal gait.    LABS/IMAGING:  None reviewed    ASSESSMENT  [name] is a 43 year old woman with seasonal allergies, chronic snoring, and mild sleep apnea (AHI 7) with primarily upper airway resistance syndrome who presents after trial of CPAP and feels no different in terms of sleepiness symptoms despite normalization of AHI.  It is entirely possible that she is having frequent awakenings due to the mask / sinus congestion / headaches leading to non-restorative sleep.  She reports oral therapies can worsen TMJ symptoms and lead to headaches.  Additionally, her sleep arousal threshold is low, and can benefit from a sedative hypnotic i.e. Zolpidem, which she can use with or without CPAP.  However, she is strongly opposed to use any medications that could lead to dependence.  Lastly, given nasal turbinate hypertrophy and deviated nasal septum, she may benefit from evaluation by otolaryngology which hopefully with suitable intervention can lead to decrease in symptoms and better CPAP tolerance.  She declines medicated nasal sprays due to prior intolerance.     PLAN  1. [facility]  2. Continue PAP at 5-15 cm H2O, with a nasal mask as tolerated.  We reviewed insurance coverage criteria.   2. Return to clinic in 6 months, or sooner if needed.    The patient was seen, examined and the assessment and plan was formulated with Dr. Olene FlossMolly Terrell.    Thank you for the opportunity to participate in this patient's care.

## 2013-10-14 NOTE — Progress Notes (Signed)
------------------------------------------    Attending: Judeth CornfieldMartha Elizabeth Terrell  I saw and evaluated the patient with Dr. He.  I repeated the essential components of the history and exam.  I discussed the case with the fellow, and have reviewed the fellow's note.  I agree with the findings and plan as documented in the Dr. Allen KellHe's note.  Key findings based upon my evaluation and discussion:  Mild OSA/UARS without subjective benefit to a trial of CPAP (outside of post-op recovery).  She has a low arousal threshold OSA phenotype and we discussed use of sedating medications to reduce her arousals during sleep due to mild RERAs.  She declined.  She is not a candidate for MAD.  We then discussed her nasal congestion/obstruction and mouth breathing.  She may benefit from nasal therapy to reduce her oral breathing and perhaps resolve her mild UARS/OSA.  We will refer to ENT sleep surgery.  ----------------------------------------

## 2013-10-18 ENCOUNTER — Telehealth (HOSPITAL_BASED_OUTPATIENT_CLINIC_OR_DEPARTMENT_OTHER): Payer: Self-pay | Admitting: Otolaryngology

## 2013-10-18 NOTE — Telephone Encounter (Signed)
CONFIRMED PHONE NUMBER: [phone]   CALLERS FIRST AND LAST NAME:Malli  Colette RibasByrd     FACILITY NAME: n/a TITLE: n/a  CALLERS RELATIONSHIP:Self  RETURN CALL: Detailed message on voicemail only     SUBJECT: Appointment Request   REASON FOR REQUEST: sooner appointment    REQUEST APPOINTMENT WITH: Blenda NicelyEd Weaver or Denyse DagoMaya Sardesai  REFERRING PROVIDER: Judeth CornfieldMARTHA ELIZABETH BILLINGS    REQUESTED DATE: as soon as possible  REQUESTED TIME: morning appointment time. Patient has a 1-2 hours drive depending on traffic. [facility] offered [place] and [facility] unable to schedule.  UNABLE TO APPOINT: Schedule appears full

## 2013-10-25 ENCOUNTER — Encounter (HOSPITAL_BASED_OUTPATIENT_CLINIC_OR_DEPARTMENT_OTHER): Payer: No Typology Code available for payment source | Admitting: Pulmonary Disease

## 2014-01-25 ENCOUNTER — Ambulatory Visit (HOSPITAL_BASED_OUTPATIENT_CLINIC_OR_DEPARTMENT_OTHER): Payer: No Typology Code available for payment source | Attending: Otolaryngology | Admitting: Otolaryngology

## 2014-01-25 ENCOUNTER — Encounter (HOSPITAL_BASED_OUTPATIENT_CLINIC_OR_DEPARTMENT_OTHER): Payer: No Typology Code available for payment source | Admitting: Otolaryngology

## 2014-01-25 ENCOUNTER — Encounter (HOSPITAL_BASED_OUTPATIENT_CLINIC_OR_DEPARTMENT_OTHER): Payer: Self-pay | Admitting: Otolaryngology

## 2014-01-25 VITALS — BP 122/99 | HR 79 | Temp 98.1°F | Resp 16 | Ht 63.0 in | Wt 166.6 lb

## 2014-01-25 DIAGNOSIS — J342 Deviated nasal septum: Secondary | ICD-10-CM | POA: Insufficient documentation

## 2014-01-25 DIAGNOSIS — J392 Other diseases of pharynx: Secondary | ICD-10-CM

## 2014-01-25 DIAGNOSIS — J343 Hypertrophy of nasal turbinates: Secondary | ICD-10-CM

## 2014-01-25 DIAGNOSIS — J351 Hypertrophy of tonsils: Secondary | ICD-10-CM

## 2014-01-25 DIAGNOSIS — M950 Acquired deformity of nose: Secondary | ICD-10-CM | POA: Insufficient documentation

## 2014-01-25 DIAGNOSIS — M2660 Temporomandibular joint disorder, unspecified: Secondary | ICD-10-CM

## 2014-01-25 DIAGNOSIS — G4733 Obstructive sleep apnea (adult) (pediatric): Secondary | ICD-10-CM | POA: Insufficient documentation

## 2014-01-25 DIAGNOSIS — M26609 Unspecified temporomandibular joint disorder, unspecified side: Secondary | ICD-10-CM | POA: Insufficient documentation

## 2014-01-25 NOTE — Patient Instructions (Addendum)
1.  I recommend nose surgery:  Sleep endoscopy, septoplasty, inferior turbinate submucous reduction, possible adenoidectomy.  2.  Further surgery may be necessary to treat sleep apnea further (eg, uvulopalatopharyngoplasty, tonsillectomy, possibly others).  3.  If you decide to have surgery or have questions about scheduling, please call Carlos AmericanForrest Allen (Sleep Surgery Patient Care Coordinator) at [phone].   4.  For more information on sleep apnea, see the article "Obstructive Sleep Apnea Syndrome:  When to Suspect, How to Help".  5.  For more information on surgery for sleep apnea, see the article "Upper airway surgery for obstructive sleep apnea."  6.  Do not drive if sleepy.

## 2014-01-25 NOTE — Progress Notes (Addendum)
SLEEP SURGERY CONSULTATION NOTE    Visit Date:  01/25/2014    ID:  Dr. Lenice Pressman (Sleep Medicine) consulted me for surgical evaluation and possible treatment of obstructive sleep apnea (OSA) syndrome for Jacqueline Terrell.    Documentation Statement:  Highlights of my consultation are provided here.  I documented and reviewed the comprehensive history, including OSA and related history, past medical history, review of all systems, social history, and family history on the Sleep Surgery Questionnaire today.  I reviewed multiple records in the electronic record (Dr. Berneda Rose, Dr. Carlena Sax).  I obtained additional history from the patient's spouse Amarya Kuehl), who accompanied the patient to the consultation visit.  I documented my comprehensive multi-system examination and complementary diagnostic procedures on the Sleep Surgery Physical Examination form today.  Details of the nasal function studies are documented on the Acoustic Rhinometry Report form.  Details of the procedure techniques are documented in the EPIC procedure order comments.  I reviewed the sleep test data and hypnograms and summarized on the Sleep Test Summary form today.  All forms are to be scanned into the electronic medical record.    History:  Jacqueline Terrell is a 43yo WF with the chief concern of frequent awakenings.    Symptoms started 1.5 years ago, after losing 12 lbs during a stressful time when husband was preparing for spine surgery and lost job.  During evaluation of a posterior neck mass with CT scan (negative) and incidental thyroglossal duct cyst was identified, and she was referred to Dr. Carlena Sax [facility]) but no treatment needed for cyst.  Dr. Carlena Sax noted OSA symptoms and consulted Dr. Berneda Rose, who diagnosed mild OSA (see SLEEP TESTS below) and prescribed APAP.  Elani did not tolerate APAP, so Dr. Berneda Rose consults me for surgical treatment evaluation.    CURRENT OSA SYMPTOMS - Unrefreshing sleep, bothersome snoring (wakes herself and husband),  gasping/snorting in sleep w/ recurrent awakenings, observed apneas, insomnia, morning dry throat, and morning headache, all possibly exacerbated by OSA.    NASAL SYMPTOMS - L>R nasal obstruction, day and night, worse posteriorly.  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  Denies allergy symptoms (sneezing, itching, rhinorrhea).  CPAP worsens nasal breathing.  Nasal treatments used:  nasal saline and nasal steroid do not help, and antihistamine help partly.    SLEEP TESTS -   05/27/2013  OCST Dx Berneda Rose):  Normal:  AI 1, AHI 2, ODI4 1, LSAT 90%.  06/16/2013  OCST Dx Berneda Rose):  Normal:  AI 1, AHI 2, ODI4 1, LSAT 90%.  07/01/2013 PSG Dx Berneda Rose):  Mild OSA:  AI 0, AHI 7, ODI3 3, LSAT 93%.    OSA TREATMENT -   . CPAP:  2015, APAP 5 - 10cm H2O pressure trialed for 6 months, worsened sleep quality, claustrophobic, worsened nasal breathing, and skin rash from the mask.  Still trying to use it.  . Mandibular advancement device (MAD) considered, but declined because of h/o TMJ disorder and did not tolerate night guard for sleep-related grinding in the past.    . No prior airway surgery.    OSA COMORBIDITIES -   . Sleep-related grinding  . 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    Exam:  GENERAL - Healthy appearance, alert, cooperative, friendly.  VITAL SIGNS - Ht 5'3", Wt 167 lbs, BMI 29.6 kg/m2, BP 122/99 mmHg, SpO2 94% on RA.  PSYCH - Calm.  FACE -  No dysmorphic features.  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  Mild inf turbinate hypertrophy.  B nasal valve collapse on deep nasal inhalation, +Cottle test B.  Alar base unven, L higher than R.  Mole at L alar crease.  Nares open.  Dorsum straight.  No polyps.  ORAL CAVITY - MM3, webbed palate, normal uvula, 2+ tonsils.  Tongue large, rests above the occlusal plane.  Class I occlusion, interdental opening >51m.  TMJ clicking but not tender.  Health  dentition.  NECK - Hyoid rests 172mposterior and 3m51muperior to thyroid eminence while sitting upright.  Posterior neck mass corresponds to occipital prominence or muscular attachment.  No palpable anterior neck mass.    Procedures:  NASAL FUNCTION STUDIES - Results under OSA & Nasal Measures below.    OSA & Nasal Measures:  Visit Consult  Date 01/25/2014  Snoring Loudness (0-10) 10  Snoring Bother (0-10) 10  Daytime Fatigue (0-10) 6  Daytime Fatigue Impact (0-10) 5  Epworth Sleepiness Scale (0-24) 6  Symptoms of Nocturnal Events & Related Events (0-5)   Total Scale 2.5   Importance Subscale (items: 1-4,9,14,16,19) 3.8  Nasal Obstruction Symptom Evaluation Scale (0-100) 60  Nasal Obstruction Visual Analog Scale (0-100) 50  Nasal Peak Inspiratory Flow (L/min) 200  Nasal Mean Minimum Cross Sectional Area (cm2) R=0.55   L=0.62    ASSESSMENT  1. Mild OSA on PSG but more severe symptoms, incompletely treated with difficulty using APAP.  Incompletely treated OSA elevates risk of death, cardiovascular disease, motor vehicle crashes, and perioperative complications, and it creates deficits in daytime function and quality of life.  Surgical treatment can improve these clinically important outcomes; therefore surgical treatment is medically necessary.    2. Anatomic abnormalities related to OSA:  a. Nasoseptal deformity, severe  b. Inferior turbinate hypertrophy, bilateral  c. External nasal deformity: uneven ala  d. Nasal valve collapse  e. Webbed soft palate  f. Palatine tonsillar hypertrophy  g. Macroglossia of the oral tongue     3. Comorbid conditions related to or complicating OSA and its management:  a. 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 S/p hysterectomy  c. Hypertension, Stage 1  (140-159/90-99) on exam today, but unusual per her report    PLAN  1. We discussed OSA, including pathophysiology and consequences.  I provided the patient with written information about OSA and its  management.    2. Staged surgical treatment.  We discussed the goal of improving OSA while recognizing the limitation of likely not eliminating OSA.  We discussed the goals of improving the nasal airway, possibly alleviating OSA symptoms, and facilitating CPAP.  The patient met with [facility] Patient Care Coordinator.  She will consider these treatment options and contact us [place] she wishes to proceed.  We plan 6-week or longer intervals between stages.     a. Stage 1:  Sleep endoscopy, septoplasty, bilateral inferior turbinate partial submucous resection, and possible adenoidectomy (if obstructing).  Plan post-operative overnight hospital stay on the ward for careful airway and pain management.  We discussed the expected post-operative course including overnight stay in the hospital, bleeding for 2 days, pain for 4 days, and intranasal splints for 1 week.  We discussed some of the risks including, but not limited to, persistent nasal obstruction, worse snoring, worse OSA, septal perforation, need for further surgery, cosmetic deformity, and others.    b. Stage 2:  Sleep endoscopy, UPPP (ModESP, LatPP, LatPhP), PT, and  possible other procedures depending on findings on Stage 1 sleep endoscopy.    3. I plan to request a follow up diagnostic polysomnography from Dr. Berneda Rose 4 months following Stage 1 or 2 surgery to assess the impact of surgery on polysomnography outcomes.  If there is significant residual OSA then I may recommend possible future procedures, CPAP retrial, or a mandibular advancement device.

## 2014-02-04 NOTE — Progress Notes (Signed)
I, Mark Whipple, examined Jacqueline Terrell, elicited the history and reviewed the diagnostic studies with the patient and Jacqueline Terrell. I personally spent 20 minutes of a 25 minute visit in counseling with [name] and agree with the edited note above.

## 2014-02-18 ENCOUNTER — Telehealth: Payer: Self-pay | Admitting: Dietician

## 2014-02-18 NOTE — Telephone Encounter (Signed)
Patient was emailed a copy of the pre op diet with instructions to begin diet 2 weeks prior to surgery date.

## 2014-02-21 ENCOUNTER — Ambulatory Visit: Payer: Medicare Other

## 2014-02-25 NOTE — Progress Notes (Signed)
Please put orders in Epic surgery 03-15-14 pre op 03-08-14 Thanks

## 2014-03-07 ENCOUNTER — Ambulatory Visit: Payer: Medicare Other

## 2014-03-08 ENCOUNTER — Other Ambulatory Visit (HOSPITAL_COMMUNITY): Payer: Medicare Other

## 2014-03-10 ENCOUNTER — Ambulatory Visit (INDEPENDENT_AMBULATORY_CARE_PROVIDER_SITE_OTHER): Payer: Self-pay | Admitting: General Surgery

## 2014-03-10 ENCOUNTER — Ambulatory Visit (HOSPITAL_BASED_OUTPATIENT_CLINIC_OR_DEPARTMENT_OTHER): Payer: No Typology Code available for payment source | Admitting: Family

## 2014-03-10 ENCOUNTER — Encounter (HOSPITAL_BASED_OUTPATIENT_CLINIC_OR_DEPARTMENT_OTHER): Payer: Self-pay | Admitting: Internal Medicine

## 2014-03-10 ENCOUNTER — Encounter (HOSPITAL_BASED_OUTPATIENT_CLINIC_OR_DEPARTMENT_OTHER): Payer: Self-pay | Admitting: Family

## 2014-03-10 ENCOUNTER — Ambulatory Visit (HOSPITAL_BASED_OUTPATIENT_CLINIC_OR_DEPARTMENT_OTHER): Payer: No Typology Code available for payment source | Attending: Family | Admitting: Internal Medicine

## 2014-03-10 ENCOUNTER — Ambulatory Visit (HOSPITAL_BASED_OUTPATIENT_CLINIC_OR_DEPARTMENT_OTHER): Payer: No Typology Code available for payment source

## 2014-03-10 VITALS — BP 132/73 | HR 74 | Temp 98.6°F | Ht 63.0 in | Wt 158.0 lb

## 2014-03-10 VITALS — BP 132/73 | HR 74 | Wt 158.0 lb

## 2014-03-10 DIAGNOSIS — J343 Hypertrophy of nasal turbinates: Secondary | ICD-10-CM

## 2014-03-10 DIAGNOSIS — M950 Acquired deformity of nose: Secondary | ICD-10-CM

## 2014-03-10 DIAGNOSIS — Z01818 Encounter for other preprocedural examination: Secondary | ICD-10-CM

## 2014-03-10 DIAGNOSIS — R0981 Nasal congestion: Secondary | ICD-10-CM | POA: Insufficient documentation

## 2014-03-10 DIAGNOSIS — J342 Deviated nasal septum: Secondary | ICD-10-CM | POA: Insufficient documentation

## 2014-03-10 DIAGNOSIS — G4733 Obstructive sleep apnea (adult) (pediatric): Secondary | ICD-10-CM | POA: Insufficient documentation

## 2014-03-10 MED ORDER — OXYCODONE HCL 5 MG OR TABS
ORAL_TABLET | ORAL | Status: DC
Start: 2014-03-10 — End: 2014-05-24

## 2014-03-10 MED ORDER — CEPHALEXIN 500 MG OR CAPS
500.0000 mg | ORAL_CAPSULE | Freq: Two times a day (BID) | ORAL | Status: DC
Start: 2014-03-10 — End: 2014-05-24

## 2014-03-10 MED ORDER — SALINE NASAL SPRAY 0.65 % NA SOLN
NASAL | Status: DC
Start: 2014-03-10 — End: 2014-05-24

## 2014-03-10 NOTE — Patient Instructions (Signed)
We look forward to seeing you for your upcoming surgery.    Please call [phone] with any questions.    If you have urgent questions after hours, on weekends, or on holidays, please call [phone] ([place] operator) and ask for the Otolaryngology (ENT) resident on call.

## 2014-03-10 NOTE — Progress Notes (Signed)
PRE OPERATIVE CLINIC NOTE    VISIT DATE  03/10/2014    IDENTIFICATION  Jacqueline Terrell is here for a pre-operative evaluation.  She is scheduled for the following surgical procedures in the near future:    . Possible adenoidectomy  . Septoplasty  . Sleep endoscopy  . Turbinate SMR    INTERVAL HISTORY  No significant change to medical history.    She had a cold about a month ago that has resolved.  She notes that she will bring her CPAP with different nasal masks to use if needed while she is inpatient.  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  She is here with her husband, Kathlene NovemberMike.      ALLERGIES  Review of patient's allergies indicates:  Allergies   Allergen Reactions   . Sulfa Antibiotics    . Codeine        EXAMINATION  VITAL SIGNS:  BP 132/73 mmHg  Pulse 74  Temp(Src) 98.6 F (37 C)  Ht 5\' 3"  (1.6 m)  Wt 158 lb (71.668 kg)  BMI 28.00 kg/m2  SpO2 98%  GENERAL:  Well-developed, well-nourished female, sitting comfortably in exam chair, in no acute distress.  EYES:  PERRL, EOMI.  NEUROLOGIC:  Cranial nerves II through XII are grossly intact.  Sensation and coordination within normal limits.  PSYCHIATRIC:  A and O x3, appropriate mood and affect.  RESPIRATORY: CTAB. Normal respiratory effort.  No increased work of breathing, stridor or wheeze.  CARDIOVASCULAR: RRR. Carotid pulses intact.  SKIN:  No rashes, bruising, or ulceration of the head or neck.  LYMPHATIC:  No lymphadenopathy of the neck.  MSK:  Normocephalic, atraumatic.  Left TMJ clicking.    EARS: Bilateral EACs and TMs within normal limits.  NOSE: Severe left NSD with buckled right septum. Mild inferior turbinate hypertrophy. Alar base unven, L higher than R. Mole at L alar crease. No lesions, masses, polyps, or mucopurulent discharge.   ORAL CAVITY: Moist mucosa.  Modified Mallampati III, webbed palate, normal uvula. Tonsils 2+. Tongue large, mobile, midline, rests above the occlusal plane. Class I occlusion.  No lesions, masses, or  posterior pharyngeal discharge.  NECK:  Laryngotracheal structures are midline.    ASSESSMENT  Mild OSA but severe symptoms    PLAN    1. Surgical consent went over in detail. This was signed by the patient.  All questions were answered.  The details of the procedures listed above were explained to the patient using anatomical drawings.    2. We went over the plan, both pre-operatively and postoperatively.      3. Prescriptions for cephalexin,nasal saline spray, and oxycodone were provided to the patient to fill ahead of time.  As she is allergic to sulfa antibiotics (itching) that can have a cross sensitivity with Celebrex, Celebrex was not provided today.  She notes that she has taken Keflex in the past but this caused some GI upset; she understands to eat yogurt with live cultures or take probiotics and watch for signs of C. dif diarrhea.     4. Pre-operative electronic orders (acetaminophen, afrin, peridex, antibiotics, PPI, dexamethasone, SCD's) in [facility] and written H+P completed.     5. The patient also has an appointment with Anesthesia.

## 2014-03-10 NOTE — Patient Instructions (Addendum)
1.  Bring CPAP with you for use after the surgery.    2.  Follow-up with us after the surgery for repeat sleep study per Dr. Alben SpittleWeaver.    3.  Return to sleep clinic in 6 months.

## 2014-03-10 NOTE — Progress Notes (Signed)
SLEEP CENTER FOLLOW-UP VISIT    REFERRING PROVIDER: No ref. provider found    ID/CC  Jacqueline Terrell is a 43 year old female is here for follow-up for sleep apnea.    Information is obtained from the patient and husband Jacqueline Needle(Michael), who is an excellent historian.      INTERVAL HISTORY  Last seen by myself and Dr. Troy SineBillings on 10/14/13.  She was referred to sleep surgery due to CPAP intolerance from frequent awakenings and increased nasal congestion in setting of deviated septum and turbinate enlargement.  She did not respond in the past from nasal pharmacotherapy.  She declined sedative to use to help adjust to CPAP.  Her TMJ precludes MAD use.      She continued to try and use CPAP for weeks after the last appointment to no success.  She has tried nasal mask and pillows, and they both increase congestion symptoms.  Her husband reports she continued to snore even while using PAP.      Sleep History and Workup:   05/27/13 - [facility] -  AHI 2.1, mean 93%, ODI 1, nadir 90%, supine sleep recorded.    06/16/13 - [facility] - AHI 1.6, mean 93%, ODI 1.4, nadir 90%, supine sleep recorded.  07/01/13 - [facility] - REM noted, supine noted.  AHI 7, RDI 18, REM AHI 12, ODI 2.5, mean 96%, nadir 93%,     Typical Sleep Habits:    Bedtime 1 am, sleep latency 15-20 min, wake time 7 am, no alarm clock, not refreshed.   Weekends same schedule.    Overall unchanged compared to prior visit.    Frequent awakenings for snoring / choking / gasping sometimes with difficulty getting back to sleep.    No naps.    PAP Statistics:  N/a  She still has her autoset device, set to 5-15 cm.  PHM has allowed her to keep it due to upcoming surgery.      REVIEW OF SYSTEMS  No weight change, dyspnea, chest pain, edema  All other systems reviewed and are negative unless noted above.    PROBLEM LIST  Patient Active Problem List   Diagnosis   . Thyroglossal duct cyst   . Seasonal allergies   . Snoring   . Nasal septal deviation   . OSA on CPAP   . Obstructive  sleep apnea, adult   . Nasal turbinate hypertrophy   . Deviated nasal septum   . Disorder of pharynx   . Tonsillar hypertrophy   . Nose deformity   . TMJ dysfunction     ALLERGIES    Sulfa antibiotics and Codeine  MEDICATIONS    Outpatient Prescriptions Prior to Visit   Medication Sig Dispense Refill   . Ascorbic Acid (VITAMIN C OR)      . Cephalexin 500 MG Oral Cap Take 1 capsule (500 mg) by mouth every 12 hours. For 7 days. Take entire course as prescribed. 14 capsule 0   . Cetirizine HCl (ZYRTEC ALLERGY OR)      . Ibuprofen 800 MG Oral Tab      . Multiple Vitamins-Minerals (MULTIVITAMIN OR)      . OxyCODONE HCl 5 MG Oral Tab 1-2 tabs po Q4-6 hours PRN pain. Use minimum necessary, hold for sedation. 50 tablet 0   . Saline Nasal Spray 0.65 % Nasal Solution 3 sprays in each nostril 5 times a day PRN dry nasal passages 2 Inhaler 12     No facility-administered medications prior to visit.  PHYSICAL EXAM  Filed Vitals:    03/10/14 1200   BP: 132/73   Pulse: 74   Weight: 158 lb (71.668 kg)   SpO2: 98%     GEN: Well developed well nourished, pleasant, NAD  HEENT:  Sclerae anicteric.  No ptosis.     Nasal turbinate hypertrophy: 2+.    Nasal septal deviation: Significantly to the left.   Nasal airflow patency: bilateral.   Modified Mallampati: 3.   Tonsils: 2.     Tongue scalloping: yes.   Retrognathia / micrognathia / prognathia: no.   Palate: not high arched.     Dentition: good, class 1 bite.     TMJ subluxation bilateral with discomfort.  LYMPH: No cervical or clavicular adenopathy.   EXT: No clubbing/cyanosis.   NEURO: alert, appropriately conversant, speech fluent, PERRL, EOMI, face symmetric, uvula/tongue midline, MAEW, normal gait.  PSYCH: Appropriate affect.    LABS/IMAGING:  None reviewed.    ASSESSMENT  43 year old woman with seasonal allergies, mild OSA, chronic snoring, nasal septum deviation and turbinate hypertrophy presents for routine follow-up.     She remains intolerant to CPAP due to increase in  sinus congestion.  Oddly, she reports snoring with PAP.  She had trial of antihistamines and nasal steroids / saline without significant response in the past.  She has not had allergy testing, but this probably would not drastically change treatment.  She continues to have non-restorative sleep in setting of mild OSA.  One could probably get around nasal congestion issue with oronasal interface, but she declines upgrading mask size.  Again, MAD not indicated due to TMJ symptoms.  She already limits supine sleep.      She saw Dr. Alben SpittleWeaver on 01/25/14 and plans for sleep endoscopy, septoplasty, bilateral inferior turbinate SMR, and possible adenoidectomy for stage 1 surgery.  Hopefully this will improve nasal congestion symptoms and allow better PAP tolerance, or potentially improve mild OSA altogether.  She understands a repeat sleep study will be needed before proceeding to stage 2 surgery.    She is exquisitely sensitive to effects of anesthesia and opioids from prior experience after hysterectomy.      PLAN  1. Reminded her to bring CPAP for use after surgery.  2. Follow-up when repeat sleep study is due post-op.  3. Return to clinic in 6 months, or sooner if needed.    The patient was seen, examined and the assessment and plan was formulated with Dr. Olene FlossMolly Billings.    Thank you for the opportunity to participate in this patient's care.    CC:  Outside, Pcp (General)  No ref. provider found

## 2014-03-11 LAB — R/O MRSA

## 2014-03-13 NOTE — Progress Notes (Signed)
------------------------------------------    Attending: Judeth CornfieldMartha Elizabeth Terrell  I saw and evaluated the patient with Jacqueline Terrell.  I repeated the essential components of the history and exam.  I discussed the case with the fellow, and have reviewed the fellow's Terrell.  I agree with the findings and plan as documented in the Jacqueline Terrell.  Key findings based upon my evaluation and discussion:  43 yo woman with mild OSA, nasal obstruction who has been intolerant of CPAP but symptomatic with severe sleep disruption. She is scheduled for septoplasty and inf turb reduction Jacqueline Terrell.  We will do a repeat PSG post-op in hopes that much of her OSA resolves.  ----------------------------------------

## 2014-03-14 ENCOUNTER — Encounter: Payer: Medicare Other | Attending: General Surgery

## 2014-03-14 DIAGNOSIS — Z713 Dietary counseling and surveillance: Secondary | ICD-10-CM | POA: Diagnosis not present

## 2014-03-14 DIAGNOSIS — Z6841 Body Mass Index (BMI) 40.0 and over, adult: Secondary | ICD-10-CM | POA: Insufficient documentation

## 2014-03-14 NOTE — Progress Notes (Signed)
  Pre-Operative Nutrition Class:  Appt start time: 4166   End time:  1830.  Patient was seen on 03/14/14 for Pre-Operative Bariatric Surgery Education at [facility].   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 Patient educated on appropriate usage: Chocolate PB2 Lot #none Exp:02/18/2015  Celebrate Vitamins Multivitamin Grape Lot #[id number] Exp: 04/2014  Bariactiv Calcium Citrate Lot #[id number] Exp:6/16  Rebecka Apley Protein Powder Lot #[id number] Exp: 01/2015  Strawberry Premier Protein Lot #[id number] Exp: 11/27/2014  The following the learning objectives were met by the patient during this course:  Identify Pre-Op Dietary Goals and will begin 2 weeks pre-operatively  Identify appropriate sources of fluids and proteins   State protein recommendations and appropriate sources pre and post-operatively  Identify Post-Operative Dietary Goals and will follow for 2 weeks post-operatively  Identify appropriate multivitamin and calcium sources  Describe the need for physical activity post-operatively and will follow MD recommendations  State when to call healthcare provider regarding medication questions or post-operative complications  Handouts given during class include:  Pre-Op Bariatric Surgery Diet Handout  Protein Shake Handout  Post-Op Bariatric Surgery Nutrition Handout  BELT Program Information Flyer  Support Group Information Flyer  [facility] Bariatric Supplements Price List  Follow-Up Plan: Patient will follow-up at [facility] 2 weeks post operatively for diet advancement per MD.

## 2014-03-14 NOTE — Patient Instructions (Signed)
Follow:   Pre-Op Diet per MD 2 weeks prior to surgery  Phase 2- Liquids (clear/full) 2 weeks after surgery  Vitamin/Mineral/Calcium guidelines for purchasing bariatric supplements  Exercise guidelines pre and post-op per MD  Follow-up at [facility] in 2 weeks post-op for diet advancement. Contact Roxan Hockey or Lajean Saver as needed with questions/concerns.

## 2014-03-15 ENCOUNTER — Observation Stay (HOSPITAL_BASED_OUTPATIENT_CLINIC_OR_DEPARTMENT_OTHER)
Admission: RE | Admit: 2014-03-15 | Discharge: 2014-03-16 | Disposition: A | Discharge status: Home / Self Care | Payer: No Typology Code available for payment source | Attending: Otolaryngology | Admitting: Otolaryngology

## 2014-03-15 ENCOUNTER — Observation Stay (HOSPITAL_COMMUNITY): Payer: No Typology Code available for payment source | Admitting: Otolaryngology

## 2014-03-15 DIAGNOSIS — J343 Hypertrophy of nasal turbinates: Secondary | ICD-10-CM

## 2014-03-15 DIAGNOSIS — M950 Acquired deformity of nose: Secondary | ICD-10-CM

## 2014-03-15 DIAGNOSIS — J387 Other diseases of larynx: Secondary | ICD-10-CM

## 2014-03-15 DIAGNOSIS — M2660 Temporomandibular joint disorder, unspecified: Secondary | ICD-10-CM | POA: Insufficient documentation

## 2014-03-15 DIAGNOSIS — G4733 Obstructive sleep apnea (adult) (pediatric): Secondary | ICD-10-CM | POA: Insufficient documentation

## 2014-03-15 DIAGNOSIS — I10 Essential (primary) hypertension: Secondary | ICD-10-CM | POA: Insufficient documentation

## 2014-03-15 DIAGNOSIS — J342 Deviated nasal septum: Principal | ICD-10-CM | POA: Insufficient documentation

## 2014-03-16 NOTE — Patient Instructions (Addendum)
Mallory Ayers  03/16/2014   Your procedure is scheduled on: 121/21/15  Report to [facility] and follow signs to               [place] at 9:30 AM.  Call this number if you have problems the morning of surgery [phone]   Remember:  Do not eat food or drink liquids :After Midnight.              STOP ASPIRIN / HERBAL MEDS / NSAIDS 7 DAYS PREOP     Take these medicines the morning of surgery with A SIP OF WATER: LEVOTHYROXINE / LORATADINE / LYRICA                               You may not have any metal on your body including hair pins and              piercings  Do not wear jewelry, make-up, lotions, powders or perfumes.             Do not wear nail polish.  Do not shave  48 hours prior to surgery.              Men may shave face and neck.   Do not bring valuables to the hospital. [place].  Contacts, dentures or bridgework may not be worn into surgery.  Leave suitcase in the car. After surgery it may be brought to your room.     Patients discharged the day of surgery will not be allowed to drive home.  Name and phone number of your driver:  Special Instructions: N/A              Please read over the following fact sheets you were given: _____________________________________________________________________                                         [place]  Before surgery, you can play an important role.  Because skin is not sterile, your skin needs to be as free of germs as possible.  You can reduce the number of germs on your skin by washing with CHG (chlorahexidine gluconate) soap before surgery.  CHG is an antiseptic cleaner which kills germs and bonds with the skin to continue killing germs even after washing. Please DO NOT use if you have an allergy to CHG or antibacterial soaps.  If your skin becomes reddened/irritated stop using the CHG and  inform your nurse when you arrive at Short Stay. Do not shave (including legs and underarms) for at least 48 hours prior to the first CHG shower.  You may shave your face. Please follow these instructions carefully:   1.  Shower with CHG Soap the night before surgery and the  morning of Surgery.   2.  If you choose to wash your hair, wash your hair first as usual with your  normal  Shampoo.   3.  After you shampoo, rinse your hair and body thoroughly to remove the  shampoo.  4.  Use CHG as you would any other liquid soap.  You can apply chg directly  to the skin and wash . Gently wash with scrungie or clean wascloth    5.  Apply the CHG Soap to your body ONLY FROM THE NECK DOWN.   Do not use on open                           Wound or open sores. Avoid contact with eyes, ears mouth and genitals (private parts).                        Genitals (private parts) with your normal soap.              6.  Wash thoroughly, paying special attention to the area where your surgery  will be performed.   7.  Thoroughly rinse your body with warm water from the neck down.   8.  DO NOT shower/wash with your normal soap after using and rinsing off  the CHG Soap .                9.  Pat yourself dry with a clean towel.             10.  Wear clean pajamas.             11.  Place clean sheets on your bed the night of your first shower and do not  sleep with pets.  Day of Surgery : Do not apply any lotions/deodorants the morning of surgery.  Please wear clean clothes to the hospital/surgery center.  FAILURE TO FOLLOW THESE INSTRUCTIONS MAY RESULT IN THE CANCELLATION OF YOUR SURGERY    PATIENT SIGNATURE_________________________________  ______________________________________________________________________

## 2014-03-17 ENCOUNTER — Encounter (HOSPITAL_COMMUNITY)
Admission: RE | Admit: 2014-03-17 | Discharge: 2014-03-17 | Disposition: A | Discharge status: Home / Self Care | Payer: Medicare Other | Source: Ambulatory Visit | Attending: General Surgery | Admitting: General Surgery

## 2014-03-17 ENCOUNTER — Inpatient Hospital Stay (HOSPITAL_COMMUNITY): Admission: RE | Admit: 2014-03-17 | Payer: Medicare Other | Source: Ambulatory Visit

## 2014-03-17 ENCOUNTER — Encounter (HOSPITAL_COMMUNITY): Payer: Self-pay

## 2014-03-17 ENCOUNTER — Ambulatory Visit (INDEPENDENT_AMBULATORY_CARE_PROVIDER_SITE_OTHER): Payer: Self-pay | Admitting: General Surgery

## 2014-03-17 DIAGNOSIS — Z01812 Encounter for preprocedural laboratory examination: Secondary | ICD-10-CM | POA: Insufficient documentation

## 2014-03-17 DIAGNOSIS — Z793 Long term (current) use of hormonal contraceptives: Secondary | ICD-10-CM | POA: Insufficient documentation

## 2014-03-17 HISTORY — DX: Other complications of anesthesia, initial encounter: T88.59XA

## 2014-03-17 HISTORY — DX: Major depressive disorder, single episode, unspecified: F32.9

## 2014-03-17 HISTORY — DX: Depression, unspecified: F32.A

## 2014-03-17 HISTORY — DX: Adverse effect of unspecified anesthetic, initial encounter: T41.45XA

## 2014-03-17 LAB — CBC WITH DIFFERENTIAL/PLATELET
BASOS ABS: 0 10*3/uL (ref 0.0–0.1)
Basophils Relative: 0 % (ref 0–1)
Eosinophils Absolute: 0.2 10*3/uL (ref 0.0–0.7)
Eosinophils Relative: 2 % (ref 0–5)
HEMATOCRIT: 40.3 % (ref 36.0–46.0)
HEMOGLOBIN: 14.2 g/dL (ref 12.0–15.0)
Lymphocytes Relative: 28 % (ref 12–46)
Lymphs Abs: 2.5 10*3/uL (ref 0.7–4.0)
MCH: 30.1 pg (ref 26.0–34.0)
MCHC: 35.2 g/dL (ref 30.0–36.0)
MCV: 85.6 fL (ref 78.0–100.0)
MONO ABS: 0.9 10*3/uL (ref 0.1–1.0)
Monocytes Relative: 10 % (ref 3–12)
NEUTROS ABS: 5.2 10*3/uL (ref 1.7–7.7)
Neutrophils Relative %: 60 % (ref 43–77)
Platelets: 292 10*3/uL (ref 150–400)
RBC: 4.71 MIL/uL (ref 3.87–5.11)
RDW: 13.6 % (ref 11.5–15.5)
WBC: 8.8 10*3/uL (ref 4.0–10.5)

## 2014-03-17 LAB — COMPREHENSIVE METABOLIC PANEL
ALK PHOS: 78 U/L (ref 39–117)
ALT: 76 U/L — AB (ref 0–35)
AST: 72 U/L — AB (ref 0–37)
Albumin: 3.5 g/dL (ref 3.5–5.2)
Anion gap: 12 (ref 5–15)
BUN: 10 mg/dL (ref 6–23)
CHLORIDE: 104 meq/L (ref 96–112)
CO2: 25 meq/L (ref 19–32)
CREATININE: 0.69 mg/dL (ref 0.50–1.10)
Calcium: 9.6 mg/dL (ref 8.4–10.5)
GFR calc Af Amer: >90 mL/min (ref >90)
GFR calc non Af Amer: >90 mL/min (ref >90)
Glucose, Bld: 88 mg/dL (ref 70–99)
POTASSIUM: 4.3 meq/L (ref 3.7–5.3)
Sodium: 141 mEq/L (ref 137–147)
Total Protein: 7.1 g/dL (ref 6.0–8.3)

## 2014-03-17 NOTE — H&P (Signed)
Josalyn D. Garay 03/17/2014 11:01 AM Location: [facility] Patient #: [id number] DOB: 07/20/1970 Divorced / Language: English / Race: White Female History of Present Illness (Celisse Ciulla M. Paytience Bures MD; 03/17/2014 1:52 PM) Patient words: Pre Op Gastric Sleeve 03/28/2014.  The patient is a 43 year old female who presents for a pre-op visit. She is currently scheduled for laparoscopic sleeve gastrectomy with possible hiatal hernia repair later on in the month. Her surgery is to sever 21st. She comes in today for preoperative visit. She denies any changes to her medical history since I last saw her. She states she has been doing well. She denies any fever, chills, nausea, vomiting, diarrhea or constipation. She denies any chest pain, chest pressure, shortness of breath, orthopnea. She reports a good appetite. Her preoperative upper GI showed evidence of a small hiatal hernia. Her preoperative chest x-ray was within normal limits. Her abdominal ultrasound showed no evidence of gallstones but findings consistent with hepatic steatosis. This may explain her mild chronically elevated transaminases. Her TSH level was very low when it was initially checked 0.009. As a result her primary care physician has lowered her Synthroid dosage. Her triglyceride level was slightly elevated at 156. And her antibody titer for H. pylori was positive. She did not get an H. pylori breath test. 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) FATTY LIVER (571.8  K76.0) HIATAL HERNIA (553.3  K44.9) ELEVATED TRANSAMINASE LEVEL (790.4  R74.0) HIGH TRIGLYCERIDES (272.1   E78.1) HISTORY OF PULMONARY EMBOLISM (V12.55  Z86.711)  Past Surgical History (Jason McDowell, LPN; 03/17/2014 11:13 AM) Cesarean Section - 1  Diagnostic Studies History (Jason McDowell, LPN; 03/17/2014 11:13 AM) Colonoscopy never Mammogram within last year Pap Smear 1-5 years ago  Allergies (Jason McDowell, LPN; 03/17/2014 11:14 AM) No Known Drug Allergies 03/17/2014  Medication History (Frady Taddeo M Tomas Schamp, MD; 03/17/2014 1:56 PM) Xanax (1MG Tablet, Oral prn) Active. Ziac (5-6.25MG Tablet, Oral) Active. PROzac (40MG Capsule, Oral) Active. Synthroid (150MCG Tablet, Oral) Active. Claritin Reditabs (10MG Tablet Disperse, Oral) Active. Lyrica (225MG Capsule, Oral bid) Active. Multi Vitamin Daily (Oral) Active. OxyCODONE HCl ER (10MG Tablet ER 12HR, Oral prn) Active. OxyCODONE HCl (20MG Tablet, Oral prn) Active. OxyCODONE HCl (5MG/5ML Solution, 5-10 Milliliter Oral every four hours, as needed, Taken starting 03/17/2014) Active.  Social History (Jason McDowell, LPN; 03/17/2014 11:13 AM) Alcohol use Occasional alcohol use. Caffeine use Tea. No drug use Tobacco use Former smoker.  Family History (Jason McDowell, LPN; 03/17/2014 11:13 AM) Depression Daughter, Mother. Heart Disease Father, Mother. Heart disease in female family member before age 55  Pregnancy / Birth History (Jason McDowell, LPN; 03/17/2014 11:13 AM) Age at menarche 13 years. Contraceptive History Oral contraceptives. Gravida 3 Maternal age 15-20 Para 2 Regular periods     Review of Systems (Jason McDowell LPN; 03/17/2014 11:13 AM) General Not Present- Appetite Loss, Chills, Fatigue, Fever, Night Sweats, Weight Gain and Weight Loss. Skin Not Present- Change in Wart/Mole, Dryness, Hives, Jaundice, New Lesions, Non-Healing Wounds, Rash and Ulcer. HEENT Present- Seasonal Allergies. Not Present- Earache, Hearing Loss, Hoarseness, Nose Bleed, Oral Ulcers, Ringing in the Ears, Sinus Pain,  Sore Throat, Visual Disturbances, Wears glasses/contact lenses and Yellow Eyes. Respiratory Present- Snoring. Not Present- Bloody sputum, Chronic Cough, Difficulty   Breathing and Wheezing. Breast Not Present- Breast Mass, Breast Pain, Nipple Discharge and Skin Changes. Cardiovascular Not Present- Chest Pain, Difficulty Breathing Lying Down, Leg Cramps, Palpitations, Rapid Heart Rate, Shortness of Breath and Swelling of Extremities. Gastrointestinal Not Present- Abdominal Pain, Bloating, Bloody Stool, Change in Bowel Habits, Chronic diarrhea, Constipation, Difficulty Swallowing, Excessive gas, Gets full quickly at meals, Hemorrhoids, Indigestion, Nausea, Rectal Pain and Vomiting. Female Genitourinary Not Present- Frequency, Nocturia, Painful Urination, Pelvic Pain and Urgency. Musculoskeletal Not Present- Back Pain, Joint Pain, Joint Stiffness, Muscle Pain, Muscle Weakness and Swelling of Extremities. Neurological Not Present- Decreased Memory, Fainting, Headaches, Numbness, Seizures, Tingling, Tremor, Trouble walking and Weakness. Psychiatric Not Present- Anxiety, Bipolar, Change in Sleep Pattern, Depression, Fearful and Frequent crying. Endocrine Not Present- Cold Intolerance, Excessive Hunger, Hair Changes, Heat Intolerance, Hot flashes and New Diabetes. Hematology Not Present- Easy Bruising, Excessive bleeding, Gland problems, HIV and Persistent Infections.  Vitals (Jason McDowell LPN; 03/17/2014 11:19 AM) 03/17/2014 11:18 AM Weight: 241 lb Height: 46in Body Surface Area: 1.88 m Body Mass Index: 80.08 kg/m Temp.: 98.3F(Temporal)  Pulse: 68 (Regular)  Resp.: 16 (Unlabored)  BP: 122/80 (Sitting, Left Arm, Standard)     Physical Exam (Tami Blass M. Maritta Kief MD; 03/17/2014 11:54 AM)  General Mental Status-Alert. General Appearance-Consistent with stated age. Hydration-Well hydrated. Voice-Normal. Note: MORBIDLY OBESE   Head and Neck Head-normocephalic, atraumatic  with no lesions or palpable masses. Trachea-midline. Thyroid Gland Characteristics - normal size and consistency.  Eye Eyeball - Bilateral-Extraocular movements intact. Sclera/Conjunctiva - Bilateral-No scleral icterus.  Chest and Lung Exam Chest and lung exam reveals -quiet, even and easy respiratory effort with no use of accessory muscles and on auscultation, normal breath sounds, no adventitious sounds and normal vocal resonance. Inspection Chest Wall - Normal. Back - normal.  Breast - Did not examine.  Cardiovascular Cardiovascular examination reveals -normal heart sounds, regular rate and rhythm with no murmurs and normal pedal pulses bilaterally.  Abdomen Inspection Inspection of the abdomen reveals - No Hernias. Skin - Scar - Note: OLD C/S SCAR. Palpation/Percussion Palpation and Percussion of the abdomen reveal - Soft, Non Tender, No Rebound tenderness, No Rigidity (guarding) and No hepatosplenomegaly. Auscultation Auscultation of the abdomen reveals - Bowel sounds normal.  Peripheral Vascular Upper Extremity Palpation - Pulses bilaterally normal.  Neurologic Neurologic evaluation reveals -alert and oriented x 3 with no impairment of recent or remote memory. Mental Status-Normal.  Neuropsychiatric The patient's mood and affect are described as -normal. Judgment and Insight-insight is appropriate concerning matters relevant to self.  Musculoskeletal Normal Exam - Left-Upper Extremity Strength Normal. Normal Exam - Right-Upper Extremity Strength Normal. Note: BILATERAL ABOVE KNEE AMPUTATION - NO OPEN WOUNDS   Lymphatic Head & Neck  General Head & Neck Lymphatics: Bilateral - Description - Normal. Axillary - Did not examine. Femoral & Inguinal - Did not examine.    Assessment & Plan (Taralynn Quiett M. Yonael Tulloch MD; 03/17/2014 1:55 PM)  OBESITY, MORBID, BMI 40.0-49.9 (278.01  E66.01) Impression: Her weight appears stable. We discussed her  preoperative workup. I believe her mild transaminase elevation is due to her fatty liver. Hopefully 3 weight loss or weight loss surgery distal normal last time. We discussed the typical hospitalization and postoperative course. All of her questions were asked and answered. This point we will not perform a breath test. We will have the surgical stomach resection specimen tested for H. pylori. If it is positive we will treat her postoperatively. She was given a prescription for liquid oxycodone for her postoperative pain medicine   prescription today. We discussed the importance of the preoperative diet.  Current Plans Instructions: Congratulations on starting your journey to a healthier life! Over the next few weeks you will be undergoing tests (x-rays and labs) and seeing specialists to help evaluate you for weight loss surgery. These tests and consultations with a psychologist and nutritionist are needed to prepare you for the lifestyle changes that lie ahead and are often required by insurance companies to approve you for surgery. Please call me if you have any questions during the evaluation.  Pathway to Surgery:  Two weeks prior to surgery Go on the extremely low carb liquid diet - this will decrease the size of your liver which will make surgery safer - the nutritionist will go over this at a later date Attend preoperative appointment with your surgeon Attend preoperative surgery class  One week prior to surgery No aspirin products. Tylenol is acceptable  24 hours prior to surgery No alcoholic beverages Report fever greater than 100.5 or excessive nasal drainage suggesting infection Continue bariatric preop diet Perform bowel prep if ordered Do not eat or drink anything after midnight the night before surgery Do not take any medications except those instructed by the anesthesiologist  Morning of surgery Please arrive at the hospital at least 2 hours before your scheduled surgery  time. No makeup, fingernail polish or jewelry Bring insurance cards with you Bring your CPAP mask if you use this Started OxyCODONE HCl 5MG/5ML, 5-10 Milliliter every four hours, as needed, 100 Milliliter, 03/17/2014, No Refill.  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 Kelise Kuch, MD, FACS General, Bariatric, & Minimally Invasive Surgery [facility]  

## 2014-03-17 NOTE — Progress Notes (Signed)
Message left with Sharion at Dr. Dois Davenport office concerning pt not stopping hormones until 2 weeks before surgery instead of 4 wks as ordered . She said she was not aware she was suppose to stop her BCP until this week

## 2014-03-17 NOTE — Progress Notes (Signed)
Portable equip notified of order for Beri Bed with trapeze.

## 2014-03-22 ENCOUNTER — Ambulatory Visit (HOSPITAL_BASED_OUTPATIENT_CLINIC_OR_DEPARTMENT_OTHER): Payer: No Typology Code available for payment source | Attending: Otolaryngology | Admitting: Otolaryngology

## 2014-03-22 ENCOUNTER — Encounter (HOSPITAL_BASED_OUTPATIENT_CLINIC_OR_DEPARTMENT_OTHER): Payer: Self-pay | Admitting: Otolaryngology

## 2014-03-22 VITALS — BP 130/80 | HR 85 | Temp 98.1°F | Ht 63.0 in | Wt 158.0 lb

## 2014-03-22 DIAGNOSIS — J342 Deviated nasal septum: Secondary | ICD-10-CM | POA: Insufficient documentation

## 2014-03-22 DIAGNOSIS — G4733 Obstructive sleep apnea (adult) (pediatric): Secondary | ICD-10-CM | POA: Insufficient documentation

## 2014-03-22 DIAGNOSIS — J343 Hypertrophy of nasal turbinates: Secondary | ICD-10-CM | POA: Insufficient documentation

## 2014-03-22 NOTE — Patient Instructions (Signed)
1.  Apply Vaseline ointment to the nostrils twice per day for dryness or crusting, for at least 1 week.  2.  Nasal saline as needed for irritation.  3.  Only gentle nose-blowing for 1 week, best after nasal saline spray.  4.  No nasal irrigation (NeilMed) for 1 week.  5.  No vigorous activity where blood pressure elevates for 1 week, but walking encouraged.

## 2014-03-22 NOTE — Progress Notes (Signed)
SLEEP SURGERY CLINIC NOTE    Visit Date:  03/22/2014    ID:  Jacqueline Terrell is a 44yo WF who follows up for obstructive sleep apnea (OSA), 1 wk s/p stage 1 surgery including Septoplasty, B inf turb SMR/fx, and SE on 03/15/2014.    Prior OSA Surgery:  12/14/2013 Septoplasty, B inf turb SMR/fx, SE.  SE showed VP lat collapse (75%), PT lat collapse (75%), mild LT hypertrophy in vallecula, and epiglottis AP collapse (100%).    Interval History:  Uneventful recovery other than tender nose.  After splint removal, improved nasal breathing and tenderness.    Exam:  GENERAL - Healthy appearance, alert, cooperative, friendly.  VITAL SIGNS - Ht 5'3", Wt 158 lbs, BMI 28 kg/m2, BP 130/80 mmHg, SpO2 95% on RA.  NOSE - Splints removed, nose suctioned.  Septum straight, midline, and intact.  Inf turbs reduced.    ASSESSMENT  1. Nasal airway improved.  2. Mild OSA due to VP, PT, and epiglottis collapse.    PLAN  1. Nasal Vaseline BID x1 wk then PRN.  2. Nasal saline PRN.  3. F/u 2 mos for nasal check and consider stage 2 surgery:  SE, UPPP (LatPP, LatPhP), PT, and epiglottoplasty (-ectomy or -pexy).

## 2014-03-24 ENCOUNTER — Telehealth (HOSPITAL_BASED_OUTPATIENT_CLINIC_OR_DEPARTMENT_OTHER): Payer: Self-pay | Admitting: Otolaryngology

## 2014-03-24 NOTE — Telephone Encounter (Signed)
CONFIRMED PHONE NUMBER:[phone]   CALLERS FIRST AND LAST NAME: Louanna Rawina Marie Dettmann   FACILITY NAME: n/a TITLE: n/a   CALLERS RELATIONSHIP:Self   RETURN CALL: Detailed message on voicemail only   SUBJECT: General Message   REASON FOR REQUEST: Patient requesting a call back from Dr. Alben SpittleWeaver and/or her care team has some post op questions.   MESSAGE: Per patient incision site has some redness, puffiness, and discharge. Patient seeking medical advise. Please call back to advise. Thanks.          CC:  Patient report nasal splints removed two days ago.  Today, she reports there's a little pus vs snot and a pus bubble inside the right bottom nostril the size of a pencil lead about 1/4 inch long.  It feels firm, red and irritated.  She states the inside of her nostril looks inflamed.  She has a slight sore throat and more congestion.  Patient denies fevers, chills, bleeding, breathing difficulty or swallowing difficulty.  She is taking tylenol 2 tablets twice daily with adequate pain relief.  She continues nasal Vaseline and nasal saline as instructed by Dr. Alben SpittleWeaver.    PLAN:  Called to Dr. Wende MottWeaver's office.  No answer.  Dr. Alben SpittleWeaver notified via email.  Will call back with Dr. Wende MottWeaver's response.    Patient states understanding and agrees to the above instructions.

## 2014-03-24 NOTE — Telephone Encounter (Signed)
CONFIRMED PHONE [phone]  CALLERS FIRST AND LAST NAME: Jacqueline Terrell  FACILITY NAME: n/a TITLE: n/a  CALLERS RELATIONSHIP:Self  RETURN CALL: Detailed message on voicemail only     SUBJECT: General Message   REASON FOR REQUEST: Patient requesting a call back from Dr. Alben SpittleWeaver and/or her care team has some post op questions.    MESSAGE: Per patient incision site has some redness, puffiness, and discharge.  Patient seeking medical advise.  Please call back to advise.  Thanks.

## 2014-03-25 NOTE — Telephone Encounter (Signed)
Call to Dr. Wende MottWeaver's office.  No answer.  Phone keeps ringing.  Resend email.

## 2014-03-25 NOTE — Telephone Encounter (Addendum)
CONFIRMED PHONE NUMBER:[phone]  CALLERS FIRST AND LAST NAME: Jacqueline Terrell  FACILITY NAME: n/a TITLE: n/a  CALLERS RELATIONSHIP:Self  RETURN CALL: Detailed message on voicemail only  SUBJECT: General Message  REASON FOR REQUEST: Patient requesting a call back from Dr. Alben SpittleWeaver and/or her care team has some post op questions.  MESSAGE: Per patient incision site has some redness, puffiness, and discharge. Patient seeking medical advise. Please call back to advise. Thanks.    CC:  Patient report nasal splints removed two days ago.  Today, she reports there's a little pus vs snot and a pus bubble inside the right bottom nostril the size of a pencil lead about 1/4 inch long.  It feels firm, red and irritated.  She states the inside of her nostril looks inflamed.  She has a slight sore throat and more congestion.  Patient denies fevers, chills, bleeding, breathing difficulty or swallowing difficulty.  She is taking tylenol 2 tablets twice daily with adequate pain relief.  She continues nasal Vaseline and nasal saline as instructed by Dr. Alben SpittleWeaver.    PLAN:  Per Dr. Wende MottWeaver's email on 03/25/14 at 1615:  "It is probably a stitch abscess.  She should clean it with alcohol on a cotton ball twice per day.  If it drains, that is fine, still keep it clean.  If it is not improved by Monday, please have her come in and see Shanda BumpsJessica or a nurse to check it.  If necessary, it can be lanced with a #11 scalpel."    Call to patient.  No answer.  Left message regarding the above instructions by Dr. Alben SpittleWeaver.  Please call the clinic [phone] if questions or concerns.

## 2014-03-28 ENCOUNTER — Inpatient Hospital Stay (HOSPITAL_COMMUNITY): Payer: Medicare Other | Admitting: Certified Registered"

## 2014-03-28 ENCOUNTER — Encounter (HOSPITAL_COMMUNITY): Payer: Self-pay | Admitting: *Deleted

## 2014-03-28 ENCOUNTER — Inpatient Hospital Stay (HOSPITAL_COMMUNITY)
Admission: RE | Admit: 2014-03-28 | Discharge: 2014-03-30 | DRG: 621 | Disposition: A | Discharge status: Home / Self Care | Payer: Medicare Other | Source: Ambulatory Visit | Attending: General Surgery | Admitting: General Surgery

## 2014-03-28 ENCOUNTER — Encounter (HOSPITAL_COMMUNITY)
Admission: RE | Disposition: A | Discharge status: Home / Self Care | Payer: Self-pay | Source: Ambulatory Visit | Attending: General Surgery

## 2014-03-28 ENCOUNTER — Telehealth (HOSPITAL_BASED_OUTPATIENT_CLINIC_OR_DEPARTMENT_OTHER): Payer: Self-pay | Admitting: Otolaryngology

## 2014-03-28 DIAGNOSIS — K760 Fatty (change of) liver, not elsewhere classified: Secondary | ICD-10-CM | POA: Diagnosis present

## 2014-03-28 DIAGNOSIS — R740 Nonspecific elevation of levels of transaminase and lactic acid dehydrogenase [LDH]: Secondary | ICD-10-CM | POA: Diagnosis present

## 2014-03-28 DIAGNOSIS — E781 Pure hyperglyceridemia: Secondary | ICD-10-CM | POA: Diagnosis present

## 2014-03-28 DIAGNOSIS — R7309 Other abnormal glucose: Secondary | ICD-10-CM | POA: Diagnosis present

## 2014-03-28 DIAGNOSIS — Z89619 Acquired absence of unspecified leg above knee: Secondary | ICD-10-CM

## 2014-03-28 DIAGNOSIS — G4733 Obstructive sleep apnea (adult) (pediatric): Secondary | ICD-10-CM | POA: Diagnosis present

## 2014-03-28 DIAGNOSIS — Z79899 Other long term (current) drug therapy: Secondary | ICD-10-CM | POA: Diagnosis not present

## 2014-03-28 DIAGNOSIS — G8929 Other chronic pain: Secondary | ICD-10-CM | POA: Diagnosis present

## 2014-03-28 DIAGNOSIS — Z89611 Acquired absence of right leg above knee: Secondary | ICD-10-CM | POA: Diagnosis not present

## 2014-03-28 DIAGNOSIS — F32A Depression, unspecified: Secondary | ICD-10-CM | POA: Diagnosis present

## 2014-03-28 DIAGNOSIS — I10 Essential (primary) hypertension: Secondary | ICD-10-CM | POA: Diagnosis present

## 2014-03-28 DIAGNOSIS — K449 Diaphragmatic hernia without obstruction or gangrene: Secondary | ICD-10-CM

## 2014-03-28 DIAGNOSIS — R7401 Elevation of levels of liver transaminase levels: Secondary | ICD-10-CM | POA: Diagnosis present

## 2014-03-28 DIAGNOSIS — Z86711 Personal history of pulmonary embolism: Secondary | ICD-10-CM | POA: Diagnosis not present

## 2014-03-28 DIAGNOSIS — F329 Major depressive disorder, single episode, unspecified: Secondary | ICD-10-CM | POA: Diagnosis present

## 2014-03-28 DIAGNOSIS — E039 Hypothyroidism, unspecified: Secondary | ICD-10-CM | POA: Diagnosis present

## 2014-03-28 DIAGNOSIS — E782 Mixed hyperlipidemia: Secondary | ICD-10-CM | POA: Diagnosis present

## 2014-03-28 DIAGNOSIS — Z6841 Body Mass Index (BMI) 40.0 and over, adult: Secondary | ICD-10-CM | POA: Diagnosis not present

## 2014-03-28 DIAGNOSIS — Z89612 Acquired absence of left leg above knee: Secondary | ICD-10-CM | POA: Diagnosis not present

## 2014-03-28 DIAGNOSIS — G894 Chronic pain syndrome: Secondary | ICD-10-CM | POA: Diagnosis present

## 2014-03-28 DIAGNOSIS — Z9884 Bariatric surgery status: Secondary | ICD-10-CM

## 2014-03-28 DIAGNOSIS — F419 Anxiety disorder, unspecified: Secondary | ICD-10-CM | POA: Diagnosis present

## 2014-03-28 HISTORY — PX: LAPAROSCOPIC GASTRIC SLEEVE RESECTION: SHX5895

## 2014-03-28 LAB — PREGNANCY, URINE: PREG TEST UR: NEGATIVE

## 2014-03-28 LAB — HEMOGLOBIN AND HEMATOCRIT, BLOOD
HCT: 40.1 % (ref 36.0–46.0)
HEMOGLOBIN: 14 g/dL (ref 12.0–15.0)

## 2014-03-28 SURGERY — GASTRECTOMY, SLEEVE, LAPAROSCOPIC
Anesthesia: General

## 2014-03-28 MED ORDER — PROPOFOL 10 MG/ML IV BOLUS
INTRAVENOUS | Status: AC
  Filled 2014-03-28: qty 20

## 2014-03-28 MED ORDER — UNJURY CHICKEN SOUP POWDER: 2.0000 [oz_av] | Freq: Four times a day (QID) | ORAL | Status: DC

## 2014-03-28 MED ORDER — ONDANSETRON HCL 4 MG/2ML IJ SOLN: 4.0000 mg | INTRAMUSCULAR | Status: DC | PRN

## 2014-03-28 MED ORDER — PROPOFOL 10 MG/ML IV BOLUS
INTRAVENOUS | Status: DC | PRN
  Administered 2014-03-28: 150 mg via INTRAVENOUS

## 2014-03-28 MED ORDER — ONDANSETRON HCL 4 MG/2ML IJ SOLN
INTRAMUSCULAR | Status: AC
  Filled 2014-03-28: qty 2

## 2014-03-28 MED ORDER — CHLORHEXIDINE GLUCONATE 4 % EX LIQD: 60.0000 mL | Freq: Once | CUTANEOUS | Status: DC

## 2014-03-28 MED ORDER — ACETAMINOPHEN 10 MG/ML IV SOLN
1000.0000 mg | Freq: Four times a day (QID) | INTRAVENOUS | Status: AC
  Administered 2014-03-28 – 2014-03-29 (×4): 1000 mg via INTRAVENOUS
  Filled 2014-03-28 (×7): qty 100

## 2014-03-28 MED ORDER — PROMETHAZINE HCL 25 MG/ML IJ SOLN
INTRAMUSCULAR | Status: AC
  Filled 2014-03-28: qty 1

## 2014-03-28 MED ORDER — EPHEDRINE SULFATE 50 MG/ML IJ SOLN
INTRAMUSCULAR | Status: AC
Start: 2014-03-28 — End: 2014-03-28
  Filled 2014-03-28: qty 1

## 2014-03-28 MED ORDER — ACETAMINOPHEN 160 MG/5ML PO SOLN: 650.0000 mg | ORAL | Status: DC | PRN

## 2014-03-28 MED ORDER — METOPROLOL TARTRATE 1 MG/ML IV SOLN
5.0000 mg | Freq: Four times a day (QID) | INTRAVENOUS | Status: DC
  Administered 2014-03-29 – 2014-03-30 (×2): 5 mg via INTRAVENOUS
  Filled 2014-03-28 (×11): qty 5

## 2014-03-28 MED ORDER — LACTATED RINGERS IR SOLN
Status: DC | PRN
  Administered 2014-03-28: 1

## 2014-03-28 MED ORDER — UNJURY VANILLA POWDER
2.0000 [oz_av] | Freq: Four times a day (QID) | ORAL | Status: DC
  Administered 2014-03-30: 2 [oz_av] via ORAL

## 2014-03-28 MED ORDER — ROCURONIUM BROMIDE 100 MG/10ML IV SOLN
INTRAVENOUS | Status: DC | PRN
  Administered 2014-03-28 (×2): 10 mg via INTRAVENOUS
  Administered 2014-03-28: 30 mg via INTRAVENOUS

## 2014-03-28 MED ORDER — GLYCOPYRROLATE 0.2 MG/ML IJ SOLN
INTRAMUSCULAR | Status: AC
  Filled 2014-03-28: qty 1

## 2014-03-28 MED ORDER — DEXTROSE 5 % IV SOLN
2.0000 g | INTRAVENOUS | Status: AC
  Administered 2014-03-28: 2 g via INTRAVENOUS

## 2014-03-28 MED ORDER — ONDANSETRON HCL 4 MG/2ML IJ SOLN
INTRAMUSCULAR | Status: DC | PRN
  Administered 2014-03-28: 4 mg via INTRAVENOUS

## 2014-03-28 MED ORDER — MIDAZOLAM HCL 2 MG/2ML IJ SOLN
INTRAMUSCULAR | Status: AC
  Filled 2014-03-28: qty 2

## 2014-03-28 MED ORDER — HEPARIN SODIUM (PORCINE) 5000 UNIT/ML IJ SOLN
5000.0000 [IU] | INTRAMUSCULAR | Status: AC
Start: 2014-03-28 — End: 2014-03-28
  Administered 2014-03-28: 5000 [IU] via SUBCUTANEOUS
  Filled 2014-03-28: qty 1

## 2014-03-28 MED ORDER — SUCCINYLCHOLINE CHLORIDE 20 MG/ML IJ SOLN
INTRAMUSCULAR | Status: DC | PRN
  Administered 2014-03-28: 100 mg via INTRAVENOUS

## 2014-03-28 MED ORDER — KCL IN DEXTROSE-NACL 20-5-0.45 MEQ/L-%-% IV SOLN
INTRAVENOUS | Status: DC
  Administered 2014-03-28 – 2014-03-29 (×2): via INTRAVENOUS
  Administered 2014-03-29: 125 mL via INTRAVENOUS
  Filled 2014-03-28 (×7): qty 1000

## 2014-03-28 MED ORDER — SODIUM CHLORIDE 0.9 % IJ SOLN
INTRAMUSCULAR | Status: AC
  Filled 2014-03-28: qty 50

## 2014-03-28 MED ORDER — SODIUM CHLORIDE 0.9 % IJ SOLN
INTRAMUSCULAR | Status: AC
  Filled 2014-03-28: qty 10

## 2014-03-28 MED ORDER — LACTATED RINGERS IV SOLN
INTRAVENOUS | Status: DC
  Administered 2014-03-28: 1000 mL via INTRAVENOUS
  Administered 2014-03-28: 13:00:00 via INTRAVENOUS

## 2014-03-28 MED ORDER — GLYCOPYRROLATE 0.2 MG/ML IJ SOLN
INTRAMUSCULAR | Status: AC
  Filled 2014-03-28: qty 3

## 2014-03-28 MED ORDER — EPHEDRINE SULFATE 50 MG/ML IJ SOLN
INTRAMUSCULAR | Status: DC | PRN
  Administered 2014-03-28: 10 mg via INTRAVENOUS

## 2014-03-28 MED ORDER — UNJURY CHOCOLATE CLASSIC POWDER: 2.0000 [oz_av] | Freq: Four times a day (QID) | ORAL | Status: DC

## 2014-03-28 MED ORDER — ACETAMINOPHEN 160 MG/5ML PO SOLN: 325.0000 mg | ORAL | Status: DC | PRN

## 2014-03-28 MED ORDER — FENTANYL CITRATE 0.05 MG/ML IJ SOLN
INTRAMUSCULAR | Status: DC | PRN
  Administered 2014-03-28: 50 ug via INTRAVENOUS
  Administered 2014-03-28: 100 ug via INTRAVENOUS
  Administered 2014-03-28 (×2): 50 ug via INTRAVENOUS

## 2014-03-28 MED ORDER — HYDROMORPHONE HCL 1 MG/ML IJ SOLN: 0.2500 mg | INTRAMUSCULAR | Status: DC | PRN

## 2014-03-28 MED ORDER — DEXAMETHASONE SODIUM PHOSPHATE 10 MG/ML IJ SOLN
INTRAMUSCULAR | Status: DC | PRN
  Administered 2014-03-28: 10 mg via INTRAVENOUS

## 2014-03-28 MED ORDER — DEXAMETHASONE SODIUM PHOSPHATE 10 MG/ML IJ SOLN
INTRAMUSCULAR | Status: AC
  Filled 2014-03-28: qty 1

## 2014-03-28 MED ORDER — FENTANYL CITRATE 0.05 MG/ML IJ SOLN
INTRAMUSCULAR | Status: AC
Start: 2014-03-28 — End: 2014-03-28
  Filled 2014-03-28: qty 5

## 2014-03-28 MED ORDER — MORPHINE SULFATE 2 MG/ML IJ SOLN
2.0000 mg | INTRAMUSCULAR | Status: DC | PRN
  Administered 2014-03-28 – 2014-03-29 (×5): 6 mg via INTRAVENOUS
  Filled 2014-03-28 (×5): qty 3

## 2014-03-28 MED ORDER — ENOXAPARIN SODIUM 30 MG/0.3ML ~~LOC~~ SOLN
30.0000 mg | Freq: Two times a day (BID) | SUBCUTANEOUS | Status: DC
  Administered 2014-03-29 – 2014-03-30 (×3): 30 mg via SUBCUTANEOUS
  Filled 2014-03-28 (×5): qty 0.3

## 2014-03-28 MED ORDER — OXYCODONE HCL 5 MG/5ML PO SOLN
5.0000 mg | ORAL | Status: DC | PRN
  Administered 2014-03-29 (×2): 10 mg via ORAL
  Filled 2014-03-28: qty 10
  Filled 2014-03-28 (×2): qty 5

## 2014-03-28 MED ORDER — ROCURONIUM BROMIDE 100 MG/10ML IV SOLN
INTRAVENOUS | Status: AC
  Filled 2014-03-28: qty 1

## 2014-03-28 MED ORDER — PANTOPRAZOLE SODIUM 40 MG IV SOLR
40.0000 mg | Freq: Every day | INTRAVENOUS | Status: DC
  Administered 2014-03-28 – 2014-03-29 (×2): 40 mg via INTRAVENOUS
  Filled 2014-03-28 (×3): qty 40

## 2014-03-28 MED ORDER — SODIUM CHLORIDE 0.9 % IJ SOLN
INTRAMUSCULAR | Status: DC | PRN
  Administered 2014-03-28: 50 mL via INTRAVENOUS

## 2014-03-28 MED ORDER — STERILE WATER FOR IRRIGATION IR SOLN
Status: DC | PRN
  Administered 2014-03-28: 1500 mL

## 2014-03-28 MED ORDER — DEXTROSE 5 % IV SOLN
INTRAVENOUS | Status: AC
  Filled 2014-03-28: qty 2

## 2014-03-28 MED ORDER — 0.9 % SODIUM CHLORIDE (POUR BTL) OPTIME
TOPICAL | Status: DC | PRN
  Administered 2014-03-28: 1000 mL

## 2014-03-28 MED ORDER — BUPIVACAINE LIPOSOME 1.3 % IJ SUSP
20.0000 mL | Freq: Once | INTRAMUSCULAR | Status: AC
Start: 2014-03-28 — End: 2014-03-28
  Administered 2014-03-28: 20 mL
  Filled 2014-03-28: qty 20

## 2014-03-28 MED ORDER — NEOSTIGMINE METHYLSULFATE 10 MG/10ML IV SOLN
INTRAVENOUS | Status: DC | PRN
  Administered 2014-03-28: 4 mg via INTRAVENOUS

## 2014-03-28 MED ORDER — PROMETHAZINE HCL 25 MG/ML IJ SOLN
6.2500 mg | INTRAMUSCULAR | Status: DC | PRN
  Administered 2014-03-28: 6.25 mg via INTRAVENOUS

## 2014-03-28 MED ORDER — GLYCOPYRROLATE 0.2 MG/ML IJ SOLN
INTRAMUSCULAR | Status: DC | PRN
  Administered 2014-03-28: 0.2 mg via INTRAVENOUS
  Administered 2014-03-28: 0.6 mg via INTRAVENOUS

## 2014-03-28 MED ORDER — MIDAZOLAM HCL 5 MG/5ML IJ SOLN
INTRAMUSCULAR | Status: DC | PRN
  Administered 2014-03-28: 2 mg via INTRAVENOUS

## 2014-03-28 SURGICAL SUPPLY — 65 items
APL SRG 32X5 SNPLK LF DISP (MISCELLANEOUS)
APPLICATOR COTTON TIP 6IN STRL (MISCELLANEOUS) IMPLANT
APPLIER CLIP ROT 10 11.4 M/L (STAPLE)
APR CLP MED LRG 11.4X10 (STAPLE)
BLADE SURG SZ11 CARB STEEL (BLADE) ×3 IMPLANT
CABLE HIGH FREQUENCY MONO STRZ (ELECTRODE) IMPLANT
CHLORAPREP W/TINT 26ML (MISCELLANEOUS) ×4 IMPLANT
CLIP APPLIE ROT 10 11.4 M/L (STAPLE) IMPLANT
DEVICE SUT QUICK LOAD TK 5 (STAPLE) ×1 IMPLANT
DEVICE SUT TI-KNOT TK 5X26 (MISCELLANEOUS) ×1 IMPLANT
DEVICE SUTURE ENDOST 10MM (ENDOMECHANICALS) ×2 IMPLANT
DEVICE TI KNOT TK5 (MISCELLANEOUS) ×1
DEVICE TROCAR PUNCTURE CLOSURE (ENDOMECHANICALS) ×3 IMPLANT
DRAPE CAMERA CLOSED 9X96 (DRAPES) ×3 IMPLANT
DRAPE UTILITY XL STRL (DRAPES) ×6 IMPLANT
ELECT REM PT RETURN 9FT ADLT (ELECTROSURGICAL) ×3
ELECTRODE REM PT RTRN 9FT ADLT (ELECTROSURGICAL) ×1 IMPLANT
GAUZE SPONGE 4X4 12PLY STRL (GAUZE/BANDAGES/DRESSINGS) IMPLANT
GLOVE BIOGEL M STRL SZ7.5 (GLOVE) ×3 IMPLANT
GOWN STRL REUS W/TWL XL LVL3 (GOWN DISPOSABLE) ×11 IMPLANT
HOVERMATT SINGLE USE (MISCELLANEOUS) ×3 IMPLANT
KIT BASIN OR (CUSTOM PROCEDURE TRAY) ×3 IMPLANT
LIQUID BAND (GAUZE/BANDAGES/DRESSINGS) ×3 IMPLANT
NDL SPNL 22GX3.5 QUINCKE BK (NEEDLE) ×1 IMPLANT
NEEDLE SPNL 22GX3.5 QUINCKE BK (NEEDLE) ×3 IMPLANT
PACK UNIVERSAL I (CUSTOM PROCEDURE TRAY) ×3 IMPLANT
PEN SKIN MARKING BROAD (MISCELLANEOUS) ×3 IMPLANT
QUICK LOAD TK 5 (STAPLE) ×1
RELOAD STAPLE 60 3.6 BLU REG (STAPLE) IMPLANT
RELOAD STAPLE 60 3.8 GOLD REG (STAPLE) IMPLANT
RELOAD STAPLE 60 4.1 GRN THCK (STAPLE) IMPLANT
RELOAD STAPLER BLUE 60MM (STAPLE) IMPLANT
RELOAD STAPLER GOLD 60MM (STAPLE) ×1 IMPLANT
RELOAD STAPLER GREEN 60MM (STAPLE) ×2 IMPLANT
SCISSORS LAP 5X35 DISP (ENDOMECHANICALS) ×2 IMPLANT
SCISSORS LAP 5X45 EPIX DISP (ENDOMECHANICALS) ×3 IMPLANT
SEALANT SURGICAL APPL DUAL CAN (MISCELLANEOUS) ×1 IMPLANT
SET IRRIG TUBING LAPAROSCOPIC (IRRIGATION / IRRIGATOR) ×3 IMPLANT
SHEARS CURVED HARMONIC AC 45CM (MISCELLANEOUS) ×3 IMPLANT
SLEEVE ADV FIXATION 5X100MM (TROCAR) ×6 IMPLANT
SLEEVE GASTRECTOMY 36FR VISIGI (MISCELLANEOUS) ×3 IMPLANT
SLEEVE XCEL OPT CAN 5 100 (ENDOMECHANICALS) ×2 IMPLANT
SOLUTION ANTI FOG 6CC (MISCELLANEOUS) ×3 IMPLANT
SPONGE LAP 18X18 X RAY DECT (DISPOSABLE) ×3 IMPLANT
STAPLE ECHEON FLEX 60 POW ENDO (STAPLE) ×2 IMPLANT
STAPLER ECHELON BIOABSB 60 FLE (MISCELLANEOUS) ×12 IMPLANT
STAPLER ECHELON LONG 60 440 (INSTRUMENTS) IMPLANT
STAPLER RELOAD BLUE 60MM (STAPLE)
STAPLER RELOAD GOLD 60MM (STAPLE) ×3
STAPLER RELOAD GREEN 60MM (STAPLE) ×6
SUT MNCRL AB 4-0 PS2 18 (SUTURE) ×3 IMPLANT
SUT SURGIDAC NAB ES-9 0 48 120 (SUTURE) IMPLANT
SUT VICRYL 0 TIES 12 18 (SUTURE) ×3 IMPLANT
SYR 20CC LL (SYRINGE) ×3 IMPLANT
SYR 50ML LL SCALE MARK (SYRINGE) ×3 IMPLANT
TOWEL OR 17X26 10 PK STRL BLUE (TOWEL DISPOSABLE) ×3 IMPLANT
TOWEL OR NON WOVEN STRL DISP B (DISPOSABLE) ×3 IMPLANT
TRAY FOLEY CATH 14FRSI W/METER (CATHETERS) ×2 IMPLANT
TROCAR ADV FIXATION 5X100MM (TROCAR) ×3 IMPLANT
TROCAR BLADELESS 15MM (ENDOMECHANICALS) ×2 IMPLANT
TROCAR BLADELESS OPT 5 100 (ENDOMECHANICALS) ×3 IMPLANT
TUBING CONNECTING 10 (TUBING) ×2 IMPLANT
TUBING CONNECTING 10' (TUBING) ×1
TUBING ENDO SMARTCAP (MISCELLANEOUS) ×3 IMPLANT
TUBING FILTER THERMOFLATOR (ELECTROSURGICAL) ×3 IMPLANT

## 2014-03-28 NOTE — Telephone Encounter (Signed)
CONFIRMED PHONE NUMBER: [phone]   CALLERS FIRST AND LAST NAME: Jacqueline Terrell   FACILITY NAME: n/a TITLE: n/a   CALLERS RELATIONSHIP:Self   RETURN CALL: Detailed message on voicemail only   SUBJECT: General Message   REASON FOR REQUEST: Rash, 2 weeks post-op   MESSAGE: Patient states that she had surgery about 2 weeks ago and is now experiencing a light rash, pink to red, across her cheeks which is slightly blotchy on one side. CCR attempted to transfer to clinic triage line; no answer. Please call patient to discuss.      CC:  Patient reports red rash to both sides of her nose.  Feels warmer where the rash is.  Cheeks and eyes are still puffy.  Patient complains of sore throat, neck slightly swollen, and generalized malaise.  Patient states, "I don't feel very good today."  Patient denies fevers, chills, trouble breathing, trouble swallowing or chest pain.  [place] in [place] and does not want to drive to [place].    PLAN:  Instructed to follow up with her PCP.  Please call the clinic for condition updates, questions or concerns.  Patient states understanding and agrees to follow the above instructions.

## 2014-03-28 NOTE — Telephone Encounter (Signed)
CONFIRMED PHONE NUMBER: [phone]  CALLERS FIRST AND LAST NAME: Jacqueline Terrell  FACILITY NAME: n/a TITLE: n/a  CALLERS RELATIONSHIP:Self  RETURN CALL: Detailed message on voicemail only     SUBJECT: General Message   REASON FOR REQUEST: Rash, 2 weeks post-op    MESSAGE: Patient states that she had surgery about 2 weeks ago and is now experiencing a light rash, pink to red, across her cheeks which is slightly blotchy on one side. CCR attempted to transfer to clinic triage line; no answer. Please call patient to discuss.

## 2014-03-28 NOTE — Telephone Encounter (Addendum)
CONFIRMED PHONE NUMBER: [phone]   CALLERS FIRST AND LAST NAME: Jacqueline Terrell   FACILITY NAME: n/a TITLE: n/a   CALLERS RELATIONSHIP:Self   RETURN CALL: Detailed message on voicemail only   SUBJECT: General Message   REASON FOR REQUEST: Rash, 2 weeks post-op   MESSAGE: Patient states that she had surgery about 2 weeks ago and is now experiencing a light rash, pink to red, across her cheeks which is slightly blotchy on one side. CCR attempted to transfer to clinic triage line; no answer. Please call patient to discuss.      CC: Patient reports red rash to both sides of her nose. Feels warmer where the rash is. Cheeks and eyes are still puffy. Patient complains of sore throat, neck slightly swollen, and generalized malaise. Patient states, "I don't feel very good today." Patient denies fevers, chills, trouble breathing, trouble swallowing or chest pain. She lives in [place] and does not want to drive to [place].    PLAN: Instructed to follow up with her PCP. Please call the clinic [phone] for condition updates, questions or concerns. Patient states understanding and agrees to follow the above instructions.

## 2014-03-28 NOTE — Anesthesia Postprocedure Evaluation (Signed)
  Anesthesia Post-op Note  Patient: Mallory Ayers  Procedure(s) Performed: Procedure(s) (LRB): LAPAROSCOPIC GASTRIC SLEEVE RESECTION,hiatal hernia repair, upper endoscopy (N/A)  Patient Location: PACU  Anesthesia Type: General  Level of Consciousness: awake and alert   Airway and Oxygen Therapy: Patient Spontanous Breathing  Post-op Pain: mild  Post-op Assessment: Post-op Vital signs reviewed, Patient's Cardiovascular Status Stable, Respiratory Function Stable, Patent Airway and No signs of Nausea or vomiting  Last Vitals:  Filed Vitals:   03/28/14 1445  BP: 119/63  Pulse:   Temp:   Resp:     Post-op Vital Signs: stable   Complications: No apparent anesthesia complications

## 2014-03-28 NOTE — Anesthesia Preprocedure Evaluation (Addendum)
Anesthesia Evaluation  Patient identified by MRN, date of birth, ID band Patient awake    Reviewed: Allergy & Precautions, H&P , NPO status , Patient's Chart, lab work & pertinent test results  History of Anesthesia Complications (+) history of anesthetic complications  Airway Mallampati: II  TM Distance: >3 FB Neck ROM: Full    Dental no notable dental hx.    Pulmonary sleep apnea , former smoker,  breath sounds clear to auscultation  Pulmonary exam normal       Cardiovascular hypertension, Pt. on medications and Pt. on home beta blockers Rhythm:Regular Rate:Normal     Neuro/Psych PSYCHIATRIC DISORDERS Depression  Neuromuscular disease    GI/Hepatic negative GI ROS, Neg liver ROS,   Endo/Other  Morbid obesity  Renal/GU negative Renal ROS  negative genitourinary   Musculoskeletal negative musculoskeletal ROS (+)   Abdominal (+) + obese,   Peds negative pediatric ROS (+)  Hematology negative hematology ROS (+)   Anesthesia Other Findings   Reproductive/Obstetrics negative OB ROS                            Anesthesia Physical Anesthesia Plan  ASA: III  Anesthesia Plan: General   Post-op Pain Management:    Induction: Intravenous  Airway Management Planned: Oral ETT  Additional Equipment:   Intra-op Plan:   Post-operative Plan: Extubation in OR  Informed Consent: I have reviewed the patients History and Physical, chart, labs and discussed the procedure including the risks, benefits and alternatives for the proposed anesthesia with the patient or authorized representative who has indicated his/her understanding and acceptance.   Dental advisory given  Plan Discussed with: CRNA  Anesthesia Plan Comments:        Anesthesia Quick Evaluation

## 2014-03-28 NOTE — H&P (View-Only) (Signed)
Nellie Pester 03/17/2014 11:01 AM Location: [facility] Patient #: [id number] DOB: 1970-05-16 Divorced / Language: Cleophus Molt / Race: White Female History of Present Illness Randall Hiss M. Hadden Steig MD; 03/17/2014 1:52 PM) Patient words: Pre Op Gastric Sleeve 03/28/2014.  The patient is a 43 year old female who presents for a pre-op visit. She is currently scheduled for laparoscopic sleeve gastrectomy with possible hiatal hernia repair later on in the month. Her surgery is to sever 21st. She comes in today for preoperative visit. She denies any changes to her medical history since I last saw her. She states she has been doing well. She denies any fever, chills, nausea, vomiting, diarrhea or constipation. She denies any chest pain, chest pressure, shortness of breath, orthopnea. She reports a good appetite. Her preoperative upper GI showed evidence of a small hiatal hernia. Her preoperative chest x-ray was within normal limits. Her abdominal ultrasound showed no evidence of gallstones but findings consistent with hepatic steatosis. This may explain her mild chronically elevated transaminases. Her TSH level was very low when it was initially checked 0.009. As a result her primary care physician has lowered her Synthroid dosage. Her triglyceride level was slightly elevated at 156. And her antibody titer for H. pylori was positive. She did not get an H. pylori breath test. 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 Ziac (5-6.25MG  Tablet, Oral) Active. PROzac (40MG  Capsule, Oral) Active. Synthroid (150MCG Tablet, Oral) Active. Claritin Reditabs (10MG  Tablet Disperse, Oral) Active. Lyrica (225MG  Capsule, Oral bid) Active. Multi Vitamin Daily (Oral) Active. OxyCODONE HCl ER (10MG  Tablet ER 12HR, Oral prn) Active. OxyCODONE HCl (20MG  Tablet, Oral prn) Active. OxyCODONE HCl (5MG /5ML Solution, 5-10 Milliliter Oral every four hours, as needed, Taken starting 03/17/2014) Active.  Social History Festus Holts, LPN; 44/96/7591 63:84 AM) Alcohol use Occasional alcohol use. Caffeine use Tea. No drug use Tobacco use Former smoker.  Family History Festus Holts, LPN; 66/59/9357 01:77 AM) Depression Daughter, Mother. Heart Disease Father, Mother. Heart disease in female family member before age 61  Pregnancy / Birth History Festus Holts, LPN; 93/90/3009 23:30 AM) Age at menarche 30 years. Contraceptive History Oral contraceptives. Gravida 3 Maternal age 40-20 Para 2 Regular periods     Review of Systems Festus Holts LPN; 07/62/2633 35:45 AM) General Not Present- Appetite Loss, Chills, Fatigue, Fever, Night Sweats, Weight Gain and Weight Loss. Skin Not Present- Change in Wart/Mole, Dryness, Hives, Jaundice, New Lesions, Non-Healing Wounds, Rash and Ulcer. HEENT Present- Seasonal Allergies. Not Present- Earache, Hearing Loss, Hoarseness, Nose Bleed, Oral Ulcers, Ringing in the Ears, Sinus Pain,  Sore Throat, Visual Disturbances, Wears glasses/contact lenses and Yellow Eyes. Respiratory Present- Snoring. Not Present- Bloody sputum, Chronic Cough, Difficulty  Breathing and Wheezing. Breast Not Present- Breast Mass, Breast Pain, Nipple Discharge and Skin Changes. Cardiovascular Not Present- Chest Pain, Difficulty Breathing Lying Down, Leg Cramps, Palpitations, Rapid Heart Rate, Shortness of Breath and Swelling of Extremities. Gastrointestinal Not Present- Abdominal Pain, Bloating, Bloody Stool, Change in Bowel Habits, Chronic diarrhea, Constipation, Difficulty Swallowing, Excessive gas, Gets full quickly at meals, Hemorrhoids, Indigestion, Nausea, Rectal Pain and Vomiting. Female Genitourinary Not Present- Frequency, Nocturia, Painful Urination, Pelvic Pain and Urgency. Musculoskeletal Not Present- Back Pain, Joint Pain, Joint Stiffness, Muscle Pain, Muscle Weakness and Swelling of Extremities. Neurological Not Present- Decreased Memory, Fainting, Headaches, Numbness, Seizures, Tingling, Tremor, Trouble walking and Weakness. Psychiatric Not Present- Anxiety, Bipolar, Change in Sleep Pattern, Depression, Fearful and Frequent crying. Endocrine Not Present- Cold Intolerance, Excessive Hunger, Hair Changes, Heat Intolerance, Hot flashes and New Diabetes. Hematology Not Present- Easy Bruising, Excessive bleeding, Gland problems, HIV and Persistent Infections.  Vitals Festus Holts LPN; 40/98/1191 47:82 AM) 03/17/2014 11:18 AM Weight: 241 lb Height: 46in Body Surface Area: 1.88 m Body Mass Index: 80.08 kg/m Temp.: 98.33F(Temporal)  Pulse: 68 (Regular)  Resp.: 16 (Unlabored)  BP: 122/80 (Sitting, Left Arm, Standard)     Physical Exam Randall Hiss M. Aletha Allebach MD; 03/17/2014 11:54 AM)  General Mental Status-Alert. General Appearance-Consistent with stated age. Hydration-Well hydrated. Voice-Normal. Note: MORBIDLY OBESE   Head and Neck Head-normocephalic, atraumatic  with no lesions or palpable masses. Trachea-midline. Thyroid Gland Characteristics - normal size and consistency.  Eye Eyeball - Bilateral-Extraocular movements intact. Sclera/Conjunctiva - Bilateral-No scleral icterus.  Chest and Lung Exam Chest and lung exam reveals -quiet, even and easy respiratory effort with no use of accessory muscles and on auscultation, normal breath sounds, no adventitious sounds and normal vocal resonance. Inspection Chest Wall - Normal. Back - normal.  Breast - Did not examine.  Cardiovascular Cardiovascular examination reveals -normal heart sounds, regular rate and rhythm with no murmurs and normal pedal pulses bilaterally.  Abdomen Inspection Inspection of the abdomen reveals - No Hernias. Skin - Scar - Note: OLD C/S SCAR. Palpation/Percussion Palpation and Percussion of the abdomen reveal - Soft, Non Tender, No Rebound tenderness, No Rigidity (guarding) and No hepatosplenomegaly. Auscultation Auscultation of the abdomen reveals - Bowel sounds normal.  Peripheral Vascular Upper Extremity Palpation - Pulses bilaterally normal.  Neurologic Neurologic evaluation reveals -alert and oriented x 3 with no impairment of recent or remote memory. Mental Status-Normal.  Neuropsychiatric The patient's mood and affect are described as -normal. Judgment and Insight-insight is appropriate concerning matters relevant to self.  Musculoskeletal Normal Exam - Left-Upper Extremity Strength Normal. Normal Exam - Right-Upper Extremity Strength Normal. Note: BILATERAL ABOVE KNEE AMPUTATION - NO OPEN WOUNDS   Lymphatic Head & Neck  General Head & Neck Lymphatics: Bilateral - Description - Normal. Axillary - Did not examine. Femoral & Inguinal - Did not examine.    Assessment & Plan Randall Hiss M. Meridian Scherger MD; 03/17/2014 1:55 PM)  OBESITY, MORBID, BMI 40.0-49.9 (278.01  E66.01) Impression: Her weight appears stable. We discussed her  preoperative workup. I believe her mild transaminase elevation is due to her fatty liver. Hopefully 3 weight loss or weight loss surgery distal normal last time. We discussed the typical hospitalization and postoperative course. All of her questions were asked and answered. This point we will not perform a breath test. We will have the surgical stomach resection specimen tested for H. pylori. If it is positive we will treat her postoperatively. She was given a prescription for liquid oxycodone for her postoperative pain medicine  prescription today. We discussed the importance of the preoperative diet.  Current Plans Instructions: Congratulations on starting your journey to a healthier life! Over the next few weeks you will be undergoing tests (x-rays and labs) and seeing specialists to help evaluate you for weight loss surgery. These tests and consultations with a psychologist and nutritionist are needed to prepare you for the lifestyle changes that lie ahead and are often required by insurance companies to approve you for surgery. Please call me if you have any questions during the evaluation.  Pathway to Surgery:  Two weeks prior to surgery Go on the extremely low carb liquid diet - this will decrease the size of your liver which will make surgery safer - the nutritionist will go over this at a later date Attend preoperative appointment with your surgeon Attend preoperative surgery class  One week prior to surgery No aspirin products. Tylenol is acceptable  24 hours prior to surgery No alcoholic beverages Report fever greater than 100.5 or excessive nasal drainage suggesting infection Continue bariatric preop diet Perform bowel prep if ordered Do not eat or drink anything after midnight the night before surgery Do not take any medications except those instructed by the anesthesiologist  Morning of surgery Please arrive at the hospital at least 2 hours before your scheduled surgery  time. No makeup, fingernail polish or jewelry Bring insurance cards with you Bring your CPAP mask if you use this Started OxyCODONE HCl 5MG /5ML, 5-10 Milliliter every four hours, as needed, 100 Milliliter, 03/17/2014, No Refill.  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 Redmond Pulling, MD, FACS General, Bariatric, & Minimally Invasive Surgery [facility], [place]

## 2014-03-28 NOTE — Op Note (Signed)
Mallory Ayers [id number] 1970-04-16 03/28/2014  Preoperative diagnosis: sleeve gastrectomy  Postoperative diagnosis: Same   Procedure: Upper endoscopy   Surgeon: Catalina Antigua B. Hassell Done  M.D., FACS   Anesthesia: Gen.   Indications for procedure: This patient was undergoing a sleeve gastrectomy.    Description of procedure: The endoscopy was placed in the mouth and into the oropharynx and under endoscopic vision it was advanced to the esophagogastric junction.  The pouch was insufflated and the sleeve was examined from the EG junction to the pylorous.   No bleeding or leaks were detected.  The scope was withdrawn without difficulty.     Matt B. Hassell Done, MD, FACS General, Bariatric, & Minimally Invasive Surgery [facility], [place]

## 2014-03-28 NOTE — Transfer of Care (Signed)
Immediate Anesthesia Transfer of Care Note  Patient: Mallory Ayers  Procedure(s) Performed: Procedure(s) (LRB): LAPAROSCOPIC GASTRIC SLEEVE RESECTION,hiatal hernia repair, upper endoscopy (N/A)  Patient Location: PACU  Anesthesia Type: General  Level of Consciousness: sedated, patient cooperative and responds to stimulation  Airway & Oxygen Therapy: Patient Spontanous Breathing and Patient connected to face mask oxgen  Post-op Assessment: Report given to PACU RN and Post -op Vital signs reviewed and stable  Post vital signs: Reviewed and stable  Complications: No apparent anesthesia complications

## 2014-03-28 NOTE — Op Note (Signed)
03/28/2014 Mallory Ayers 1970-07-17 [id number]   PRE-OPERATIVE DIAGNOSIS:   Morbid obesity Hiatal hernia Hypertension OSA Depression Elevated transaminases H/o bilateral above knee amputations.  Elevated triglycerides   POST-OPERATIVE DIAGNOSIS:  same  PROCEDURE:  Procedure(s): LAPAROSCOPIC SLEEVE GASTRECTOMY WITH HIATAL HERNIA REPAIR UPPER GI ENDOSCOPY  SURGEON:  Surgeon(s): Gayland Curry, MD FACS FASMBS  ASSISTANTS: Johnathan Hausen, MD FACS  ANESTHESIA:   general  DRAINS: none   BOUGIE: 62 fr ViSiGi  LOCAL MEDICATIONS USED:  MARCAINE + Exparel  SPECIMEN:  Source of Specimen:  Greater curvature of stomach  DISPOSITION OF SPECIMEN:  PATHOLOGY  COUNTS:  YES  INDICATION FOR PROCEDURE: This is a very pleasant 43 year old morbidly obese WF who has had unsuccessful attempts for sustained weight loss. She presents today for a planned laparoscopic sleeve gastrectomy with upper endoscopy. We have discussed the risk and benefits of the procedure extensively preoperatively. Please see my separate notes.  PROCEDURE: After obtaining informed consent and receiving 5000 units of subcutaneous heparin, the patient was brought to the operating room at [facility] and placed supine on the operating room table. General endotracheal anesthesia was established. Sequential compression devices were placed. A Foley catheter was placed. A orogastric tube was placed. The patient's abdomen was prepped and draped in the usual standard surgical fashion. She received preoperative IV antibiotics. A surgical timeout was performed.  Access to the abdomen was achieved using a 5 mm 0 laparoscope thru a 5 mm trocar In the left upper Quadrant 2 fingerbreadths below the left subcostal margin using the Optiview technique. Pneumoperitoneum was smoothly established up to 15 mm of mercury. The laparoscope was advanced and the abdominal cavity was surveilled. There was evidence of a hiatal hernia on  laparoscopy - gap in the left and right crus anteriorly.  A 5 mm trocar was placed slightly above and to the left of the umbilicus under direct visualization. The patient was then placed in reverse Trendelenburg. The [facility] liver retractor was placed under the left lobe of the liver through a 5 mm trocar incision site in the subxiphoid position. A 5 mm trocar was placed in the lateral right upper quadrant along with a 15 mm trocar in the mid right abdomen  All under direct visualization after local had been infiltrated.  The stomach was inspected. It was completely decompressed and the orogastric tube was removed.  There is a small anterior dimple that was obviously visible. The calibration tube was placed in the oropharynx and guided down into the stomach by the CRNA. 10 mL of air was insufflated into the calibration balloon. The calibration tubing was then gently pulled back by the CRNA and it slid past the GE junction. At this point the calibration tubing was desufflated and pulled back into the esophagus. This confirmed my suspicion of a clinically significant hiatal hernia. The gastrohepatic ligament was incised with harmonic scalpel. The right crus was identified. We identified the crossing fat along the right crus. The adipose tissue just above this area was incised with harmonic scalpel. I then bluntly dissected out this area and identified the left crus. There was evidence of a hiatal hernia. I then mobilized the esophagus. The left and right crus were further mobilized with blunt dissection. I was then able to reapproximate the left and right crus with 0 Ethibond using an Endostitch suture device and securing it with a titanium tyknot. We then had the CRNA readvanced the calibration tubing back into the stomach. 10 mL of air was  insufflated into the calibration tube balloon. The calibration tube was then gently pulled back and there was resistance at the GE junction. The tube did not slide back up  into the esophagus. At this point the calibration tubing was deflated and removed from the patient's body.   We identified the pylorus and measured 5- 6 cm proximal to the pylorus and identified an area of where we would start taking down the short gastric vessels. Harmonic scalpel was used to take down the short gastric vessels along the greater curvature of the stomach. We were able to enter the lesser sac. We continued to march along the greater curvature of the stomach taking down the short gastrics. As we approached the gastrosplenic ligament we took care in this area not to injure the spleen. We were able to take down the entire gastrosplenic ligament. We then mobilized the fundus away from the left crus of diaphragm. There were some posterior gastric avascular attachments which were taken down. This left the stomach completely mobilized. No vessels had been taken down along the lesser curvature of the stomach.  We then reidentified the pylorus. A 36Fr ViSiGi was then placed in the oropharynx and advanced down into the stomach and placed in the distal antrum and positioned along the lesser curvature. It was placed under suction which secured the 36Fr ViSiGi in place along the lesser curve. Then using the Ethicon echelon 60 mm stapler with a green load with Seamguard, I placed a stapler along the antrum approximately 5 cm from the pylorus. The stapler was angled so that there is ample room at the angularis incisura. I then fired the first staple load after inspecting it posteriorly to ensure adequate space both anteriorly and posteriorly. At this point I still was not completely past the angularis so with another green load with Seamguard, I placed the stapler in position just inside the prior stapleline. We then rotated the stomach to insure that there was adequate anteriorly as well as posteriorly. The stapler was then fired. I used  61mm gold cartridge with seamguard. At this point I started using 60 mm  gold load staple cartridges with Seamguard. The echelon stapler was then repositioned with a 60 mm gold load with Seamguard and we continued to march up along the [place]. My assistant was holding traction along the greater curvature stomach along the cauterized short gastric vessels ensuring that the stomach was symmetrically retracted. Prior to each firing of the staple, we rotated the stomach to ensure that there is adequate stomach left.  As we approached the fundus, I used 60 mm blue cartridge with Seamguard aiming slightly lateral to the esophageal fat pad. Although the staples on this fire had completely gone thru the last part of the stomach it had not completely cut it. Therefore 1 additional 60 blue load was used to free the remaining stomach. The sleeve was inspected. There is no evidence of cork screw. The staple line appeared hemostatic. The CRNA inflated the ViSiGi to the green zone and the upper abdomen was flooded with saline. There were no bubbles. The sleeve was decompressed and the ViSiGi removed. My assistant scrubbed out and performed an upper endoscopy. The sleeve easily distended with air and the scope was easily advanced to the pylorus. There is no evidence of internal bleeding or cork screwing. There was no narrowing at the angularis. There is no evidence of bubbles. Please see his operative note for further details. The gastric sleeve was decompressed and the endoscope  was removed.  The greater curvature the stomach was grasped with a laparoscopic grasper and removed from the 15 mm trocar site.  The liver retractor was removed. I then closed the 15 mm trocar site with 2 interrupted 0 Vicryl sutures through the fascia using the endoclose. The closure was viewed laparoscopically and it was airtight. 70 cc of Exparel was then infiltrated in the preperitoneal spaces around the trocar sites. Pneumoperitoneum was released. All trocar sites were closed with a 4-0 Monocryl in a subcuticular fashion  followed by the application of Dermabond. The patient was extubated and taken to the recovery room in stable condition. All needle, instrument, and sponge counts were correct x2. There are no immediate complications  (2) 60 mm green with Seamguard (2) 60 mm gold with seamguard (3) 60 mm blue with 1 seamguard  PLAN OF CARE: Admit to inpatient   PATIENT DISPOSITION:  PACU - hemodynamically stable.   Delay start of Pharmacological VTE agent (>24hrs) due to surgical blood loss or risk of bleeding:  no  [name]. Redmond Pulling, MD, FACS General, Bariatric, & Minimally Invasive Surgery [facility] Surgery, [place]

## 2014-03-28 NOTE — Interval H&P Note (Signed)
History and Physical Interval Note:  03/28/2014 11:07 AM  Mallory Ayers  has presented today for surgery, with the diagnosis of Morbid Obesity  The various methods of treatment have been discussed with the patient and family. After consideration of risks, benefits and other options for treatment, the patient has consented to  Procedure(s): LAPAROSCOPIC GASTRIC SLEEVE RESECTION (N/A) as a surgical intervention .  The patient's history has been reviewed, patient examined, no change in status, stable for surgery.  I have reviewed the patient's chart and labs.  Questions were answered to the patient's satisfaction.    [name]. Redmond Pulling, MD, [place], Bariatric, & Minimally Invasive Surgery [facility], [place]    [facility]

## 2014-03-29 ENCOUNTER — Inpatient Hospital Stay (HOSPITAL_COMMUNITY): Payer: Medicare Other

## 2014-03-29 ENCOUNTER — Ambulatory Visit: Payer: Medicare Other

## 2014-03-29 ENCOUNTER — Encounter (HOSPITAL_COMMUNITY): Payer: Self-pay | Admitting: General Surgery

## 2014-03-29 LAB — COMPREHENSIVE METABOLIC PANEL
ALBUMIN: 3.5 g/dL (ref 3.5–5.2)
ALT: 83 U/L — ABNORMAL HIGH (ref 0–35)
AST: 81 U/L — ABNORMAL HIGH (ref 0–37)
Alkaline Phosphatase: 61 U/L (ref 39–117)
Anion gap: 7 (ref 5–15)
BUN: <5 mg/dL — ABNORMAL LOW (ref 6–23)
CALCIUM: 8.4 mg/dL (ref 8.4–10.5)
CO2: 27 mmol/L (ref 19–32)
CREATININE: 0.55 mg/dL (ref 0.50–1.10)
Chloride: 105 mEq/L (ref 96–112)
GFR calc Af Amer: >90 mL/min (ref >90)
Glucose, Bld: 151 mg/dL — ABNORMAL HIGH (ref 70–99)
Potassium: 4.3 mmol/L (ref 3.5–5.1)
Sodium: 139 mmol/L (ref 135–145)
Total Bilirubin: 0.4 mg/dL (ref 0.3–1.2)
Total Protein: 6.2 g/dL (ref 6.0–8.3)

## 2014-03-29 LAB — CBC WITH DIFFERENTIAL/PLATELET
Basophils Absolute: 0 10*3/uL (ref 0.0–0.1)
Basophils Relative: 0 % (ref 0–1)
EOS PCT: 0 % (ref 0–5)
Eosinophils Absolute: 0 10*3/uL (ref 0.0–0.7)
HEMATOCRIT: 38.1 % (ref 36.0–46.0)
Hemoglobin: 12.8 g/dL (ref 12.0–15.0)
Lymphocytes Relative: 12 % (ref 12–46)
Lymphs Abs: 1.3 10*3/uL (ref 0.7–4.0)
MCH: 29.5 pg (ref 26.0–34.0)
MCHC: 33.6 g/dL (ref 30.0–36.0)
MCV: 87.8 fL (ref 78.0–100.0)
MONO ABS: 0.8 10*3/uL (ref 0.1–1.0)
Monocytes Relative: 8 % (ref 3–12)
Neutro Abs: 8.4 10*3/uL — ABNORMAL HIGH (ref 1.7–7.7)
Neutrophils Relative %: 80 % — ABNORMAL HIGH (ref 43–77)
Platelets: 242 10*3/uL (ref 150–400)
RBC: 4.34 MIL/uL (ref 3.87–5.11)
RDW: 13.9 % (ref 11.5–15.5)
WBC: 10.5 10*3/uL (ref 4.0–10.5)

## 2014-03-29 LAB — HEMOGLOBIN AND HEMATOCRIT, BLOOD
HEMATOCRIT: 39.9 % (ref 36.0–46.0)
HEMOGLOBIN: 13.5 g/dL (ref 12.0–15.0)

## 2014-03-29 MED ORDER — IOHEXOL 300 MG/ML  SOLN
50.0000 mL | Freq: Once | INTRAMUSCULAR | Status: AC | PRN
  Administered 2014-03-29: 50 mL via ORAL

## 2014-03-29 MED ORDER — DIPHENHYDRAMINE HCL 50 MG/ML IJ SOLN
12.5000 mg | INTRAMUSCULAR | Status: DC | PRN
  Administered 2014-03-29: 12.5 mg via INTRAVENOUS
  Filled 2014-03-29: qty 1

## 2014-03-29 MED ORDER — HYDROMORPHONE HCL 1 MG/ML IJ SOLN
1.0000 mg | INTRAMUSCULAR | Status: DC | PRN
  Administered 2014-03-29 (×3): 1 mg via INTRAVENOUS
  Filled 2014-03-29 (×3): qty 1

## 2014-03-29 MED ORDER — ALUM & MAG HYDROXIDE-SIMETH 200-200-20 MG/5ML PO SUSP: 15.0000 mL | Freq: Four times a day (QID) | ORAL | Status: DC | PRN

## 2014-03-29 NOTE — Progress Notes (Signed)
Patient alert and oriented, pain is controlled. Patient is tolerating fluids, plan to advance to protein shake tomorrow.  Reviewed Gastric sleeve discharge instructions with patient and patient is able to articulate understanding.  Provided information on BELT program, Support Group and [facility] outpatient pharmacy. All questions answered, will continue to monitor.

## 2014-03-29 NOTE — Discharge Instructions (Signed)

## 2014-03-29 NOTE — Progress Notes (Signed)
1 Day Post-Op  Subjective: Sore and some abd wall pain not relieved with morphine. Some belching. Not much nausea  Objective: Vital signs in last 24 hours: Temp:  [97.2 F (36.2 C)-98.7 F (37.1 C)] 98 F (36.7 C) (12/22 0600) Pulse Rate:  [63-86] 63 (12/22 0600) Resp:  [18-19] 18 (12/22 0600) BP: (98-130)/(43-68) 127/67 mmHg (12/22 0600) SpO2:  [90 %-99 %] 95 % (12/22 0600) Last BM Date: 03/27/14  Intake/Output from previous day: 12/21 0701 - 12/22 0700 In: 2981.3 [I.V.:2981.3] Out: 640 [Urine:640] Intake/Output this shift:    Alert, nontoxic, nad cta b/l Reg Soft, expected mild TTP, incisions c/d/i; nd B/l AKA  Lab Results:   Recent Labs  03/28/14 1427 03/29/14 0535  WBC  --  10.5  HGB 14.0 12.8  HCT 40.1 38.1  PLT  --  242   BMET  Recent Labs  03/29/14 0535  NA 139  K 4.3  CL 105  CO2 27  GLUCOSE 151*  BUN <5*  CREATININE 0.55  CALCIUM 8.4   PT/INR No results for input(s): LABPROT, INR in the last 72 hours. ABG No results for input(s): PHART, HCO3 in the last 72 hours.  Invalid input(s): PCO2, PO2  Studies/Results: No results found.  Anti-infectives: Anti-infectives    Start     Dose/Rate Route Frequency Ordered Stop   03/28/14 0937  cefOXitin (MEFOXIN) 2 g in dextrose 5 % 50 mL IVPB     2 g100 mL/hr over 30 Minutes Intravenous On call to O.R. 03/28/14 0937 03/28/14 1150      Assessment/Plan: s/p Procedure(s): LAPAROSCOPIC GASTRIC SLEEVE RESECTION,hiatal hernia repair, upper endoscopy (N/A) Looks good. Awaiting UGI.  Add dilaudid pulm toilet Cont VTE prophylaxis with lovenox hgb ok  [name]. Redmond Pulling, MD, FACS General, Bariatric, & Minimally Invasive Surgery [facility], [place]   LOS: 1 day    Gayland Curry 03/29/2014

## 2014-03-29 NOTE — Care Management Note (Signed)
    Page 1 of 1   03/29/2014     10:50:39 AM CARE MANAGEMENT NOTE 03/29/2014  Patient:  Mallory Ayers, Mallory Ayers   Account Number:  [id number]  Date Initiated:  03/29/2014  Documentation initiated by:  Sunday Spillers  Subjective/Objective Assessment:   43 yo female admitted s/p gastric sleeve. PTA lived at home with family.     Action/Plan:   Home when stable   Anticipated DC Date:  03/31/2014   Anticipated DC Plan:  [place]  CM consult      Choice offered to / List presented to:             Status of service:  Completed, signed off Medicare Important Message given?   (If response is "NO", the following Medicare IM given date fields will be blank) Date Medicare IM given:   Medicare IM given by:   Date Additional Medicare IM given:   Additional Medicare IM given by:    Discharge Disposition:  HOME/SELF CARE  Per UR Regulation:  Reviewed for med. necessity/level of care/duration of stay  If discussed at [place] of Stay Meetings, dates discussed:    Comments:

## 2014-03-30 ENCOUNTER — Other Ambulatory Visit (INDEPENDENT_AMBULATORY_CARE_PROVIDER_SITE_OTHER): Payer: Self-pay

## 2014-03-30 DIAGNOSIS — R7401 Elevation of levels of liver transaminase levels: Secondary | ICD-10-CM | POA: Diagnosis present

## 2014-03-30 DIAGNOSIS — I10 Essential (primary) hypertension: Secondary | ICD-10-CM | POA: Diagnosis present

## 2014-03-30 DIAGNOSIS — G8929 Other chronic pain: Secondary | ICD-10-CM | POA: Diagnosis present

## 2014-03-30 DIAGNOSIS — F419 Anxiety disorder, unspecified: Secondary | ICD-10-CM | POA: Diagnosis present

## 2014-03-30 DIAGNOSIS — K760 Fatty (change of) liver, not elsewhere classified: Secondary | ICD-10-CM | POA: Diagnosis present

## 2014-03-30 DIAGNOSIS — F329 Major depressive disorder, single episode, unspecified: Secondary | ICD-10-CM | POA: Diagnosis present

## 2014-03-30 DIAGNOSIS — Z9884 Bariatric surgery status: Secondary | ICD-10-CM

## 2014-03-30 DIAGNOSIS — G894 Chronic pain syndrome: Secondary | ICD-10-CM | POA: Diagnosis present

## 2014-03-30 DIAGNOSIS — E781 Pure hyperglyceridemia: Secondary | ICD-10-CM | POA: Diagnosis present

## 2014-03-30 DIAGNOSIS — E039 Hypothyroidism, unspecified: Secondary | ICD-10-CM | POA: Diagnosis present

## 2014-03-30 DIAGNOSIS — R740 Nonspecific elevation of levels of transaminase and lactic acid dehydrogenase [LDH]: Secondary | ICD-10-CM

## 2014-03-30 DIAGNOSIS — F32A Depression, unspecified: Secondary | ICD-10-CM | POA: Diagnosis present

## 2014-03-30 DIAGNOSIS — E782 Mixed hyperlipidemia: Secondary | ICD-10-CM | POA: Diagnosis present

## 2014-03-30 DIAGNOSIS — Z86711 Personal history of pulmonary embolism: Secondary | ICD-10-CM | POA: Diagnosis present

## 2014-03-30 DIAGNOSIS — Z89619 Acquired absence of unspecified leg above knee: Secondary | ICD-10-CM

## 2014-03-30 DIAGNOSIS — K449 Diaphragmatic hernia without obstruction or gangrene: Secondary | ICD-10-CM

## 2014-03-30 LAB — CBC WITH DIFFERENTIAL/PLATELET
BASOS PCT: 0 % (ref 0–1)
Basophils Absolute: 0 10*3/uL (ref 0.0–0.1)
Eosinophils Absolute: 0.1 10*3/uL (ref 0.0–0.7)
Eosinophils Relative: 1 % (ref 0–5)
HCT: 38.1 % (ref 36.0–46.0)
HEMOGLOBIN: 12.8 g/dL (ref 12.0–15.0)
Lymphocytes Relative: 37 % (ref 12–46)
Lymphs Abs: 3.8 10*3/uL (ref 0.7–4.0)
MCH: 29.8 pg (ref 26.0–34.0)
MCHC: 33.6 g/dL (ref 30.0–36.0)
MCV: 88.8 fL (ref 78.0–100.0)
MONOS PCT: 10 % (ref 3–12)
Monocytes Absolute: 1 10*3/uL (ref 0.1–1.0)
NEUTROS ABS: 5.3 10*3/uL (ref 1.7–7.7)
NEUTROS PCT: 52 % (ref 43–77)
Platelets: 259 10*3/uL (ref 150–400)
RBC: 4.29 MIL/uL (ref 3.87–5.11)
RDW: 14.1 % (ref 11.5–15.5)
WBC: 10.1 10*3/uL (ref 4.0–10.5)

## 2014-03-30 MED ORDER — ONDANSETRON HCL 4 MG PO TABS: 4.0000 mg | ORAL_TABLET | Freq: Three times a day (TID) | ORAL | Status: DC | PRN

## 2014-03-30 MED ORDER — ENOXAPARIN SODIUM 300 MG/3ML IJ SOLN: 30.0000 mg | Freq: Two times a day (BID) | INTRAMUSCULAR | Status: DC

## 2014-03-30 MED ORDER — BISOPROLOL FUMARATE 5 MG PO TABS
5.0000 mg | ORAL_TABLET | Freq: Every day | ORAL | Status: DC
Start: 2014-03-30 — End: 2021-06-19

## 2014-03-30 NOTE — Progress Notes (Signed)
Patient has been out of bed to wheelchair and to bathroom.  Patient is a Bil AKA and has not ambulated.  No prosthesis to ambulate.

## 2014-03-30 NOTE — Progress Notes (Signed)
Patient alert and oriented, Post op day 2.  Provided support and encouragement.  Encouraged pulmonary toilet, ambulation and small sips of liquids.  Discussed lovenox at home as well as need to discontinue combination blood pressure pill (use only beta blocker and not diuretic)  All questions answered.  Will continue to monitor.

## 2014-03-30 NOTE — Progress Notes (Signed)
OT Cancellation Note  Patient Details Name: Mallory Ayers MRN: [id number] DOB: 14-Jan-1971   Cancelled Treatment:    Reason Eval/Treat Not Completed: OT screened, no needs identified, will sign off  Spoke with pt regarding role of OT.  . No needs at this time.  Noted pts WC seat is very worn. Pt states she has tried to get a new seat but not been successful  Jeddito, Thereasa Parkin 03/30/2014, 11:10 AM

## 2014-03-30 NOTE — Plan of Care (Signed)
Problem: Food- and Nutrition-Related Knowledge Deficit (NB-1.1) Goal: Nutrition education Formal process to instruct or train a patient/client in a skill or to impart knowledge to help patients/clients voluntarily manage or modify food choices and eating behavior to maintain or improve health. Outcome: Completed/Met Date Met:  03/30/14 Nutrition Education Note  Received consult for diet education per DROP protocol.   12/21 s/p Procedure(s): LAPAROSCOPIC GASTRIC SLEEVE RESECTION,hiatal hernia repair, upper endoscopy   Discussed 2 week post op diet with pt. Emphasized that liquids must be non carbonated, non caffeinated, and sugar free. Fluid goals discussed. Pt to follow up with outpatient bariatric RD for further diet progression after 2 weeks. Multivitamins and minerals also reviewed. Teach back method used, pt expressed understanding, expect good compliance.   Diet: First 2 Weeks  You will see the nutritionist about two (2) weeks after your surgery. The nutritionist will increase the types of foods you can eat if you are handling liquids well:  If you have severe vomiting or nausea and cannot handle clear liquids lasting longer than 1 day, call your surgeon  Protein Shake  Drink at least 2 ounces of shake 5-6 times per day  Each serving of protein shakes (usually 8 - 12 ounces) should have a minimum of:  15 grams of protein  And no more than 5 grams of carbohydrate  Goal for protein each day:  Men = 80 grams per day  Women = 60 grams per day  Protein powder may be added to fluids such as non-fat milk or Lactaid milk or Soy milk (limit to 35 grams added protein powder per serving)   Hydration  Slowly increase the amount of water and other clear liquids as tolerated (See Acceptable Fluids)  Slowly increase the amount of protein shake as tolerated  Sip fluids slowly and throughout the day  May use sugar substitutes in small amounts (no more than 6 - 8 packets per day; i.e. Splenda)    Fluid Goal  The first goal is to drink at least 8 ounces of protein shake/drink per day (or as directed by the nutritionist); some examples of protein shakes are [employer], [employer], EAS Edge HP, and Unjury. See handout from pre-op Bariatric Education Class:  Slowly increase the amount of protein shake you drink as tolerated  You may find it easier to slowly sip shakes throughout the day  It is important to get your proteins in first  Your fluid goal is to drink 64 - 100 ounces of fluid daily  It may take a few weeks to build up to this  32 oz (or more) should be clear liquids  And  32 oz (or more) should be full liquids (see below for examples)  Liquids should not contain sugar, caffeine, or carbonation   Clear Liquids:  Water or Sugar-free flavored water (i.e. Fruit H2O, Propel)  Decaffeinated coffee or tea (sugar-free)  Crystal Lite, Wyler's Lite, Minute Maid Lite  Sugar-free Jell-O  Bouillon or broth  Sugar-free Popsicle: *Less than 20 calories each; Limit 1 per day   Full Liquids:  Protein Shakes/Drinks + 2 choices per day of other full liquids  Full liquids must be:  No More Than 12 grams of Carbs per serving  No More Than 3 grams of Fat per serving  Strained low-fat cream soup  Non-Fat milk  Fat-free Lactaid Milk  Sugar-free yogurt (Dannon Lite & Fit, Greek yogurt)     Clayton Bibles, MS, RD, LDN Pager: [phone] After Hours Pager: [phone]

## 2014-03-30 NOTE — Discharge Summary (Signed)
Physician Discharge Summary  Mallory Ayers VVO:[id number] DOB: Jan 20, 1971 DOA: 03/28/2014  PCP: Mallory Nasuti, MD  Admit date: 03/28/2014 Discharge date: 03/30/2014  Recommendations for Outpatient Follow-up:   Follow-up Information    Follow up with Mallory Curry, MD On 04/14/2014.   Specialty:  General Surgery   Why:  2pm (arrive 1:45pm) , For wound re-check   Contact information:   Mallory Ayers [id number] [phone]      Discharge Diagnoses:  Active Problems:   Morbid obesity   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   Chronic pain   History of pulmonary embolism   Sliding hiatal hernia   Hypothyroidism   High triglycerides   Elevated transaminase level   Fatty liver   Hypertension   Amputee, above knee - bilateral   Depression   Surgical Procedure: Laparoscopic Sleeve Gastrectomy, upper endoscopy  Discharge Condition: Good Disposition: Home  Diet recommendation: Postoperative sleeve gastrectomy diet (liquids only)  Filed Weights   03/28/14 0938 03/29/14 1100  Weight: 242 lb (109.77 kg) 242 lb (109.77 kg)     Hospital Course:  The patient was admitted for a planned laparoscopic sleeve gastrectomy. Please see operative note. Preoperatively the patient was given 5000 units of subcutaneous heparin for DVT prophylaxis. Postoperative prophylactic Lovenox dosing was started on the morning of postoperative day 1. The patient underwent an upper GI on postoperative day 1 which demonstrated no extravasation of contrast and emptying of the contrast into the small bowel. The patient was started on ice chips and water which they tolerated. On postoperative day 2 The patient's diet was advanced to protein shakes which they also tolerated. The patient was ambulating without difficulty. Their vital signs are stable without fever or tachycardia. Their hemoglobin had remained stable. . The patient had received discharge  instructions and counseling. They were deemed stable for discharge.  She will go home on lovenox injections for DVT prophylaxis given her history of pulmonary emboli and her amputee status.  She had previously undergone Lovenox injections in the past.  We will also hold her combination blood pressure pill which has the diuretic.  She will resume just a single agent beta blocker  Discharge Instructions  Discharge Instructions    Call MD for:  difficulty breathing, headache or visual disturbances    Complete by:  As directed      Call MD for:  persistant dizziness or light-headedness    Complete by:  As directed      Call MD for:  persistant nausea and vomiting    Complete by:  As directed      Call MD for:  redness, tenderness, or signs of infection (pain, swelling, redness, odor or green/yellow discharge around incision site)    Complete by:  As directed      Call MD for:  severe uncontrolled pain    Complete by:  As directed      Call MD for:    Complete by:  As directed   Temp>101     Discharge instructions    Complete by:  As directed   Follow bariatric surgery discharge instructions Get your blood work drawn early next week Mon or Tuesday     Increase activity slowly    Complete by:  As directed             Medication List    STOP taking these medications        bisoprolol-hydrochlorothiazide 5-6.25 MG  per tablet  Commonly known as:  ZIAC      TAKE these medications        ALPRAZolam 1 MG tablet  Commonly known as:  XANAX  Take 1 mg by mouth 3 (three) times daily as needed for anxiety.     bisoprolol 5 MG tablet  Commonly known as:  ZEBETA  Take 1 tablet (5 mg total) by mouth daily.     enoxaparin 300 MG/3ML Soln injection  Commonly known as:  LOVENOX  Inject 0.3 mLs (30 mg total) into the skin every 12 (twelve) hours.     FLUoxetine 40 MG capsule  Commonly known as:  PROZAC  Take 40 mg by mouth at bedtime.     hydrOXYzine 25 MG tablet  Commonly known as:   ATARAX/VISTARIL  Take 25 mg by mouth at bedtime.     levothyroxine 150 MCG tablet  Commonly known as:  SYNTHROID, LEVOTHROID  Take 150 mcg by mouth daily before breakfast.     loratadine 10 MG tablet  Commonly known as:  CLARITIN  Take 10 mg by mouth every morning.     LYRICA 225 MG capsule  Generic drug:  pregabalin  Take 225 mg by mouth 2 (two) times daily.     multivitamins with iron Tabs tablet  Take 1 tablet by mouth every morning.     norethindrone 0.35 MG tablet  Commonly known as:  MICRONOR,CAMILA,ERRIN  Take 1 tablet by mouth at bedtime.     ondansetron 4 MG tablet  Commonly known as:  ZOFRAN  Take 1 tablet (4 mg total) by mouth every 8 (eight) hours as needed for nausea or vomiting.     Oxycodone HCl 20 MG Tabs  Take 1 tablet by mouth 4 (four) times daily as needed (pain).     OxyCODONE 10 mg T12a 12 hr tablet  Commonly known as:  OXYCONTIN  Take 10 mg by mouth every 12 (twelve) hours.     PA BIOTIN 5 MG Caps  Generic drug:  Biotin  Take 1 capsule by mouth at bedtime.           Follow-up Information    Follow up with Mallory Curry, MD On 04/14/2014.   Specialty:  General Surgery   Why:  2pm (arrive 1:45pm) , For wound re-check   Contact information:   Mallory Ayers [place] [address] [phone]        The results of significant diagnostics from this hospitalization (including imaging, microbiology, ancillary and laboratory) are listed below for reference.    Significant Diagnostic Studies: Dg Ugi W/water Sol Cm  03/29/2014   CLINICAL DATA:  Postop day 1 from gastric sleeve resection.  EXAM: WATER SOLUBLE UPPER GI SERIES  TECHNIQUE: Single-column upper GI series was performed using water soluble contrast.  CONTRAST:  34mL OMNIPAQUE IOHEXOL 300 MG/ML  SOLN  COMPARISON:  Preoperative upper GI series done 07/27/2013.  FLUOROSCOPY TIME:  23 seconds  FINDINGS: The scout abdominal radiograph demonstrates postsurgical changes in the left  upper quadrant of the abdomen. The bowel gas pattern is normal. There is no evidence of free intraperitoneal air.  The patient swallowed the contrast without difficulty. The esophageal motility is normal. There is rapid filling of the partitioned stomach with drainage into the small bowel. There is no evidence of gastric outlet obstruction or extravasation.  IMPRESSION: No demonstrated complication following gastric sleeve resection.   Electronically Signed   By: Camie Patience M.D.   On:  03/29/2014 15:25    Labs: Basic Metabolic Panel:  Recent Labs Lab 03/29/14 0535  NA 139  K 4.3  CL 105  CO2 27  GLUCOSE 151*  BUN <5*  CREATININE 0.55  CALCIUM 8.4   Liver Function Tests:  Recent Labs Lab 03/29/14 0535  AST 81*  ALT 83*  ALKPHOS 61  BILITOT 0.4  PROT 6.2  ALBUMIN 3.5    CBC:  Recent Labs Lab 03/28/14 1427 03/29/14 0535 03/29/14 1709 03/30/14 0444  WBC  --  10.5  --  10.1  NEUTROABS  --  8.4*  --  5.3  HGB 14.0 12.8 13.5 12.8  HCT 40.1 38.1 39.9 38.1  MCV  --  87.8  --  88.8  PLT  --  242  --  259    CBG: No results for input(s): GLUCAP in the last 168 hours.  Active Problems:   Morbid obesity   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   Chronic pain   History of pulmonary embolism   Sliding hiatal hernia   Hypothyroidism   High triglycerides   Elevated transaminase level   Fatty liver   Hypertension   Amputee, above knee - bilateral   Depression   Time coordinating discharge: 15 min  Signed:  Gayland Curry, MD [facility], [place] [phone] 03/30/2014, 12:52 PM

## 2014-03-30 NOTE — Progress Notes (Signed)
2 Days Post-Op  Subjective: Didn't sleep well. Had some epigastric cramps with PO. Not much nausea.   Objective: Vital signs in last 24 hours: Temp:  [98.1 F (36.7 C)-99.2 F (37.3 C)] 98.4 F (36.9 C) (12/23 0605) Pulse Rate:  [46-61] 61 (12/23 0605) Resp:  [18-20] 18 (12/23 0605) BP: (105-136)/(46-72) 136/72 mmHg (12/23 0605) SpO2:  [92 %-94 %] 94 % (12/23 0605) Weight:  [242 lb (109.77 kg)] 242 lb (109.77 kg) (12/22 1100) Last BM Date: 03/27/14  Intake/Output from previous day: 12/22 0701 - 12/23 0700 In: 3640 [P.O.:240; I.V.:3000; IV Piggyback:400] Out: 1275 [Urine:1275] Intake/Output this shift:    Alert, nontoxic, not ill appearing cta b/l Reg Obese, soft, some bruising at extraction site. Ow c/d/i B/l AKA  Lab Results:   Recent Labs  03/29/14 0535 03/29/14 1709 03/30/14 0444  WBC 10.5  --  10.1  HGB 12.8 13.5 12.8  HCT 38.1 39.9 38.1  PLT 242  --  259   BMET  Recent Labs  03/29/14 0535  NA 139  K 4.3  CL 105  CO2 27  GLUCOSE 151*  BUN <5*  CREATININE 0.55  CALCIUM 8.4   PT/INR No results for input(s): LABPROT, INR in the last 72 hours. ABG No results for input(s): PHART, HCO3 in the last 72 hours.  Invalid input(s): PCO2, PO2  Studies/Results: Dg Ugi W/water Sol Cm  03/29/2014   CLINICAL DATA:  Postop day 1 from gastric sleeve resection.  EXAM: WATER SOLUBLE UPPER GI SERIES  TECHNIQUE: Single-column upper GI series was performed using water soluble contrast.  CONTRAST:  62mL OMNIPAQUE IOHEXOL 300 MG/ML  SOLN  COMPARISON:  Preoperative upper GI series done 07/27/2013.  FLUOROSCOPY TIME:  23 seconds  FINDINGS: The scout abdominal radiograph demonstrates postsurgical changes in the left upper quadrant of the abdomen. The bowel gas pattern is normal. There is no evidence of free intraperitoneal air.  The patient swallowed the contrast without difficulty. The esophageal motility is normal. There is rapid filling of the partitioned stomach with  drainage into the small bowel. There is no evidence of gastric outlet obstruction or extravasation.  IMPRESSION: No demonstrated complication following gastric sleeve resection.   Electronically Signed   By: Camie Patience M.D.   On: 03/29/2014 15:25    Anti-infectives: Anti-infectives    Start     Dose/Rate Route Frequency Ordered Stop   03/28/14 0937  cefOXitin (MEFOXIN) 2 g in dextrose 5 % 50 mL IVPB     2 g100 mL/hr over 30 Minutes Intravenous On call to O.R. 03/28/14 0937 03/28/14 1150      Assessment/Plan: s/p Procedure(s): LAPAROSCOPIC GASTRIC SLEEVE RESECTION,hiatal hernia repair, upper endoscopy (N/A) No fever. Wbc ok. Looks good. Will adv to pod 2 diet. Has expected epigastric cramps. Will re-evaluate later today and check on oral intake status  Will not send home on HCTZ.  Will send home on lovenox due to b/l AKA Discussed d/c instructions.   [name]. Redmond Pulling, MD, FACS General, Bariatric, & Minimally Invasive Surgery [facility], [place]   LOS: 2 days    Gayland Curry 03/30/2014

## 2014-03-30 NOTE — Evaluation (Signed)
Physical Therapy Evaluation Patient Details Name: Mallory Ayers MRN: [id number] DOB: 11/15/1970 Today's Date: 03/30/2014   History of Present Illness  Pt is a 43 year old female s/p lap gastric sleeve resection ,hiatal hernia repair, upper endoscopy on 03/28/14 and hx of bil AKAs.  Clinical Impression  Patient evaluated by Physical Therapy with no further acute PT needs identified. All education has been completed and the patient has no further questions. Pt educated to perform w/c mobility using manual w/c at home approx 400 feet 3 times a day to increase activity level.  Pt somewhat remembers exercises from bil AKAs so reviewed verbally and demonstrated a few UE strengthening exercises to perform starting with lower weights and increasing as tolerated (has weights at home) as well as reviewed some of her LE bed exercises for hip strengthening against gravity in bed.  Pt has no further needs at this time.  PT is signing off. Thank you for this referral.     Follow Up Recommendations No PT follow up    Equipment Recommendations  None recommended by PT    Recommendations for Other Services       Precautions / Restrictions Precautions Precautions: None      Mobility  Bed Mobility Overal bed mobility: Needs Assistance Bed Mobility: Supine to Sit     Supine to sit: Modified independent (Device/Increase time)     General bed mobility comments: pt up to power chair from bed while PT was gathering w/c, spouse in room  Transfers Overall transfer level: Needs assistance   Transfers: Lateral/Scoot Transfers          Lateral/Scoot Transfers: Supervision General transfer comment: pt pulled power chair in front of w/c and able to transfer over using upper body strength and weightbearing through residual limbs and then again from w/c to recliner  Ambulation/Gait                Hotel manager mobility:  Yes Wheelchair propulsion: Both upper extremities Wheelchair parts: Independent Distance: 400 Wheelchair Assistance Details (indicate cue type and reason): pt able to independently use RW  Modified Rankin (Stroke Patients Only)       Balance                                             Pertinent Vitals/Pain Pain Assessment: No/denies pain    Home Living Family/patient expects to be discharged to:: Private residence Living Arrangements: Spouse/significant other   Type of Home: House         Home Equipment: Wheelchair - Education officer, community - power      Prior Function Level of Independence: Independent with assistive device(s)         Comments: mostly uses power w/c     Hand Dominance        Extremity/Trunk Assessment   Upper Extremity Assessment: Overall WFL for tasks assessed           Lower Extremity Assessment: Overall WFL for tasks assessed (hx of bil AKAs)         Communication   Communication: No difficulties  Cognition Arousal/Alertness: Awake/alert Behavior During Therapy: WFL for tasks assessed/performed Overall Cognitive Status: Within Functional Limits for tasks assessed  General Comments      Exercises        Assessment/Plan    PT Assessment Patent does not need any further PT services  PT Diagnosis     PT Problem List    PT Treatment Interventions     PT Goals (Current goals can be found in the Care Plan section) Acute Rehab PT Goals PT Goal Formulation: All assessment and education complete, DC therapy    Frequency     Barriers to discharge        Co-evaluation               End of Session   Activity Tolerance: Patient tolerated treatment well Patient left: in chair;with call bell/phone within reach Nurse Communication: Mobility status         Time: 4707-6151 PT Time Calculation (min) (ACUTE ONLY): 14 min   Charges:   PT Evaluation $Initial PT  Evaluation Tier I: 1 Procedure PT Treatments $Therapeutic Exercise: 8-22 mins   PT G Codes:          Lurlean Kernen,KATHrine E 03/30/2014, 1:36 PM Carmelia Bake, PT, DPT 03/30/2014 Pager: [phone]

## 2014-04-04 ENCOUNTER — Telehealth (HOSPITAL_COMMUNITY): Payer: Self-pay

## 2014-04-04 NOTE — Telephone Encounter (Signed)
Made discharge phone call to patient per DROP protocol. Asking the following questions.    1. Do you have someone to care for you now that you are home?  yes 2. Are you having pain now that is not relieved by your pain medication?  no 3. Are you able to drink the recommended daily amount of fluids (48 ounces minimum/day) and protein (60-80 grams/day) as prescribed by the dietitian or nutritional counselor?  yes 4. Are you taking the vitamins and minerals as prescribed?  yes 5. Do you have the "on call" number to contact your surgeon if you have a problem or question?  Yes 6. Are your incisions free of redness, swelling or drainage? (If steri strips, address that these can fall off, shower as tolerated) yes 7. Have your bowels moved since your surgery?  If not, are you passing gas?  yes 8. Are you up and walking 3-4 times per day?  N/a patient is a bilateral amputee    1. Do you have an appointment made to see your surgeon in the next month?  yes 2. Were you provided your discharge medications before your surgery or before you were discharged from the hospital and are you taking them without problem?  yes 3. Were you provided phone numbers to the clinic/surgeon's office?  yes 4. Did you watch the patient education video module in the (clinic, surgeon's office, etc.) before your surgery? yes 5. Do you have a discharge checklist that was provided to you in the hospital to reference with instructions on how to take care of yourself after surgery?  yes 6. Did you see a dietitian or nutritional counselor while you were in the hospital?  yes 7. Do you have an appointment to see a dietitian or nutritional counselor in the next month?  yes

## 2014-04-11 ENCOUNTER — Telehealth (HOSPITAL_BASED_OUTPATIENT_CLINIC_OR_DEPARTMENT_OTHER): Payer: Self-pay | Admitting: Otolaryngology

## 2014-04-11 NOTE — Telephone Encounter (Signed)
CONFIRMED PHONE NUMBER: [phone]  CALLERS FIRST AND LAST NAME: Frederik Pear  FACILITY NAME: na TITLE: na  CALLERS RELATIONSHIP:Self  RETURN CALL: Detailed message on voicemail only     SUBJECT: Appointment Request   REASON FOR REQUEST: Sooner Appointment    REQUEST APPOINTMENT WITH: Dr. Alben Spittle  SYMPTOMS: F/U  REFERRING PROVIDER: Self  REQUESTED DATE: 05/23/14  REQUESTED TIME: Verify w/ pt  UNABLE TO APPOINT: Schedule appears full

## 2014-04-12 ENCOUNTER — Encounter: Payer: Medicare Other | Attending: General Surgery

## 2014-04-12 DIAGNOSIS — Z6841 Body Mass Index (BMI) 40.0 and over, adult: Secondary | ICD-10-CM | POA: Diagnosis not present

## 2014-04-12 DIAGNOSIS — Z713 Dietary counseling and surveillance: Secondary | ICD-10-CM | POA: Diagnosis not present

## 2014-04-12 NOTE — Progress Notes (Signed)
Bariatric Class:  Appt start time: 1530 end time:  1630.  2 Week Post-Operative Nutrition Class  Patient was seen on 04/12/2013 for Post-Operative Nutrition education at [facility].   Surgery date: 03/28/2014 Surgery type: Sleeve Start weight at [facility]: 241.6 lbs Weight today: 239.9  TANITA  BODY COMP RESULTS No Tanita d/t AKA   The following the learning objectives were met by the patient during this course:  Identifies Phase 3A (Soft, High Proteins) Dietary Goals and will begin from 2 weeks post-operatively to 2 months post-operatively  Identifies appropriate sources of fluids and proteins   States protein recommendations and appropriate sources post-operatively  Identifies the need for appropriate texture modifications, mastication, and bite sizes when consuming solids  Identifies appropriate multivitamin and calcium sources post-operatively  Describes the need for physical activity post-operatively and will follow MD recommendations  States when to call healthcare provider regarding medication questions or post-operative complications  Handouts given during class include:  Phase 3A: Soft, High Protein Diet Handout  Follow-Up Plan: Patient will follow-up at [facility] in 6 weeks for 2 month post-op nutrition visit for diet advancement per MD.

## 2014-04-13 NOTE — Telephone Encounter (Signed)
Spoke to patient today and informed that Dr. Alben SpittleWeaver does not have clinic on Mondays. She would like to keep her appt as scheduled on Tuesday 2/16. She also wanted to let us know that the stitches in her nose still have not dissolved and they are somewhat bothersome. I told her I would have our ARNP or nurse give her a call about it.

## 2014-05-24 ENCOUNTER — Ambulatory Visit (HOSPITAL_BASED_OUTPATIENT_CLINIC_OR_DEPARTMENT_OTHER): Payer: 59 | Attending: Otolaryngology | Admitting: Otolaryngology

## 2014-05-24 ENCOUNTER — Encounter (HOSPITAL_BASED_OUTPATIENT_CLINIC_OR_DEPARTMENT_OTHER): Payer: Self-pay | Admitting: Otolaryngology

## 2014-05-24 VITALS — BP 144/88 | HR 83 | Temp 97.9°F | Resp 16 | Ht 66.0 in | Wt 159.0 lb

## 2014-05-24 DIAGNOSIS — J392 Other diseases of pharynx: Secondary | ICD-10-CM

## 2014-05-24 DIAGNOSIS — G4733 Obstructive sleep apnea (adult) (pediatric): Secondary | ICD-10-CM

## 2014-05-24 DIAGNOSIS — Z09 Encounter for follow-up examination after completed treatment for conditions other than malignant neoplasm: Secondary | ICD-10-CM

## 2014-05-24 DIAGNOSIS — J351 Hypertrophy of tonsils: Secondary | ICD-10-CM | POA: Insufficient documentation

## 2014-05-24 DIAGNOSIS — J387 Other diseases of larynx: Secondary | ICD-10-CM | POA: Insufficient documentation

## 2014-05-24 NOTE — Patient Instructions (Addendum)
1. Nasal Vaseline to nostrils twice per day as needed for dryness or crusting.  2. Nasal saline irrigation also for nasal dryness or crusting.  3. I do not think an oral appliance will help, because you did not tolerate and because on sleep endoscopy jaw advancement did not help.  4. If/when ready, I recommend stage 2 surgery:  Sleep endoscopy, modified uvulopalatopharyngoplasty, tonsillectomy, and epiglottoplasty.  On the prior sleep endoscopy, these structures contributed to the obstructive sleep apnea.  Please call Loel DubonnetCorie Yount (the new Forrest) at [phone] if you decide to schedule.

## 2014-05-24 NOTE — Progress Notes (Signed)
SLEEP SURGERY CLINIC NOTE    Visit Date:  05/24/2014    ID:  Jacqueline Terrell is a 44yo WF who follows up for obstructive sleep apnea (OSA).    Documentation Statement:  Highlights of my evaluation are provided here.  Further details of the interval history, review of systems, and social history are documented on the Sleep Surgery Questionnaire, which I reviewed today.  I obtained additional history from the patient's spouse Jacqueline Terrell(Mike Geathers), who accompanied the patient to the clinic visit.  I documented my multi-system examination and complementary diagnostic procedures on the Sleep Surgery Physical Examination form today.  All forms are to be scanned into the electronic medical record.    Prior OSA Surgery:  12/14/2013 Septoplasty, B inf turb SMR/fx, SE.  SE showed VP lat collapse (75%), PT lat collapse (75%), mild LT hypertrophy in vallecula, and epiglottis AP collapse (100%).  Conservative mand adv inadequate.    Interval History:  The original chief concern was frequent awakenings.  Currently, it is improved partly, some nights with and some without awakenings.  Persistent OSA symptoms include:  Unrefreshing sleep, bothersome snoring (change in tone but still loud), gasping/snorting in sleep w/ recurrent awakenings, observed apneas, insomnia, morning dry throat, and morning headache.  Currently, the patient rates the OSA-related quality of life as "a little better" compared to before surgery at the time of the original consultation with me.    Nasal breathing is improved.  Minimal nose tenderness.  Using nasal saline regularly.    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  She is excited.    SLEEP TESTS -   05/27/2013  OCST Dx Troy Sine([place]):  Normal:  AI 1, AHI 2, ODI4 1, LSAT 90%.  06/16/2013  OCST Dx Troy Sine([place]):  Normal:  AI 1, AHI 2, ODI4 1, LSAT 90%.  07/01/2013 PSG Dx Troy Sine([place]):  Mild OSA:  AI 0, AHI 7, ODI3 3, LSAT 93%.    Exam:  GENERAL - Healthy appearance, alert, cooperative,  friendly.  VITAL SIGNS - Ht 5'3", Wt 159 lbs, BMI 26 kg/m2, BP 144/88 mmHg, SpO2 98% on RA.  PSYCH - Calm.  NOSE - Septum straight, midline, intact.  Inferior turbinates reduced.  Nares open.  Dorsum straight.  No polyps.  Nasal crust removed with healthy underlying mucosa.  ORAL CAVITY - MM3, webbed palate, normal uvula, 2+ tonsils.  Tongue large, rests above the occlusal plane.  Class I occlusion, interdental opening >6040mm.  Healthy dentition.  LARYNX / PHARYNX (mirror) - Retroflexed epiglottis.    OSA & Nasal Measures:  Visit Consult Current  Date 01/25/2014 05/24/2014  Weight (lbs) 167 159  Blood Pressure (mmHg) 122/99 144/88  Snoring Loudness (0-10) 10 7  Snoring Bother (0-10) 10 10  Daytime Fatigue (0-10) 6 7  Daytime Fatigue Impact (0-10) 5 6  Epworth Sleepiness Scale (0-24) 6 12  Symptoms of Nocturnal Events & Related Events (0-5)   Total Scale 2.5 1.3   Importance Subscale (items: 1-4,9,14,16,19) 3.8 2.1   Importance Subscale (current importance items)  2.2  OSA-Related QOL Change (-7 to +7)  +2  Nasal Obstruction Symptom Evaluation Scale (0-100) 60 45  Nasal Obstruction Visual Analog Scale (0-100) 50 21  Nasal Peak Inspiratory Flow (L/min) 200 60  Nasal Mean Minimum Cross Sectional Area (cm2) R=0.55    L=0.62     ASSESSMENT  1. Mild OSA with severe symptoms, now subjectively partly improved:  a. Subjective measures most improved on snoring (from 10 to 7).  OSA QOL improved +2 (-7 to +7 scale).  b. APAP 5-10cm worsened sleep quality and nasal breathing.  c. MAD not tried b/c h/o TMJ disorder and did not tol NightGuard.  SE showed inadequate effect of mand adv.  d. Positional therapy not tried (not positional OSA).    2. Anatomic abnormalities related to OSA:  a. Nasal airway improved s/p nasal surgery  b. Abnormal soft palate and VP  c. Palatine tonsillar hypertrophy, 2+  d. Lingual tonsillar hypertrophy, mild  e. Epiglottis collapse    3. Comorbid conditions related to or complicating OSA and its  management:  a. 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 S/p hysterectomy  c. Hypertension, Stage 1  (140-159/90-99)    PLAN  1. Nasal Vaseline and saline irrigation PRN crusting.    2. Discussed mild nature of OSA, that health risks are minimal, and that symptoms should drive treatment decisions.    3. Discussed APAP retrial, MAD, and stage 2 surgery.  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    4. If/when [place] decides to proceed, Stage 2 surgery:   Sleep endoscopy, uvulopalatopharyngoplasty (lateral palatopexy, lateral pharyngopexy), palatine tonsillectomy, possible lingual tonsillectomy (if epiglottopexy), and partial epiglottidectomy versus epiglottopexy.  Plan post-operative overnight hospital stay in the ICU for careful airway, pain, blood pressure, and fluid management.  We discussed the expected post-operative course including severe throat pain for 2-3 weeks, temporary dysphagia, and weight loss, and the importance of staying hydrated during the recovery.  We discussed some of the risks including, but not limited to, bleeding requiring further care, permanent dysphagia,laryngeal penetration or aspiration, taste changes, residual OSA, need for further surgery, and others.

## 2014-05-25 ENCOUNTER — Encounter: Payer: Medicare HMO | Attending: General Surgery | Admitting: Dietician

## 2014-05-25 DIAGNOSIS — Z713 Dietary counseling and surveillance: Secondary | ICD-10-CM | POA: Diagnosis not present

## 2014-05-25 DIAGNOSIS — Z6841 Body Mass Index (BMI) 40.0 and over, adult: Secondary | ICD-10-CM | POA: Insufficient documentation

## 2014-05-25 NOTE — Progress Notes (Signed)
  Follow-up visit:  8 Weeks Post-Operative Sleeve Gastrectomy Surgery  Medical Nutrition Therapy:  Appt start time: 2725 end time:  1440.  Primary concerns today: Post-operative Bariatric Surgery Nutrition Management. Returns with a 25.6 lbs weight loss and tolerating foods she has been eating.   Surgery date: 03/28/2014 Surgery type: Sleeve Start weight at [facility]: 241.6 lbs Weight today: 214.3 lbs  Weight change: 25.6 lbs Total weight loss: 27.3 lbs  TANITA  BODY COMP RESULTS No [place]ita d/t AKA  Preferred Learning Style:   No preference indicated   Learning Readiness:   Ready  24-hr recall: B (AM): Premier protein shake (30 g) Snk (AM): none L (PM): 3 oz chicken/tuna with squash (21 g) Snk (PM): none D (PM): 30 oz [place] chicken and zucchini or beans, hamburger, or fish/shrimp (21 g) (Snk (PM): Premier protein shake (30 g)  Fluid intake: 22 oz protein shake, 40 oz water with flavoring, fat free milk (not everyday) at least 62 oz  Estimated total protein intake: 102 g   Medications: see list Supplementation: taking  Using straws: No Drinking while eating: No Hair loss: No Carbonated beverages: No N/V/D/C: No, some constipation and takes a stool softener every other night Dumping syndrome: No  Recent physical activity:  Resistance bands and uses her manual wheelchair to go around the house  Progress Towards Goal(s):  In progress.  Handouts given during visit include:  Phase 3B High Protein + Non Starchy Vegetables    Nutritional Diagnosis:  [place]-3.3 Overweight/obesity related to past poor dietary habits and physical inactivity as evidenced by patient w/ recent sleeve gastrectomy surgery following dietary guidelines for continued weight loss.    Intervention:  Nutrition education/diet advancement. Goals:  Follow Phase 3B: High Protein + Non-Starchy Vegetables  Eat 3-6 small meals/snacks, every 3-5 hrs  Increase lean protein foods to meet 60g goal  Increase  fluid intake to 64oz +  Avoid drinking 15 minutes before, during and 30 minutes after eating  Aim for >30 min of physical activity daily  For liquid calcium do 1 tsp 2 x day   Teaching Method Utilized:  Visual Auditory Hands on  Barriers to learning/adherence to lifestyle change: none  Demonstrated degree of understanding via:  Teach Back   Monitoring/Evaluation:  Dietary intake, exercise, and body weight. Follow up in 3 months for 4 month post-op visit.

## 2014-05-25 NOTE — Patient Instructions (Addendum)
Goals:  Follow Phase 3B: High Protein + Non-Starchy Vegetables  Eat 3-6 small meals/snacks, every 3-5 hrs  Increase lean protein foods to meet 60g goal  Increase fluid intake to 64oz +  Avoid drinking 15 minutes before, during and 30 minutes after eating  Aim for >30 min of physical activity daily  For liquid calcium do 1 tsp 2 x day

## 2014-08-23 ENCOUNTER — Ambulatory Visit: Payer: Commercial Managed Care - HMO | Admitting: Dietician

## 2014-12-29 ENCOUNTER — Ambulatory Visit (HOSPITAL_BASED_OUTPATIENT_CLINIC_OR_DEPARTMENT_OTHER): Payer: 59 | Attending: Family | Admitting: Family

## 2014-12-29 ENCOUNTER — Encounter (HOSPITAL_BASED_OUTPATIENT_CLINIC_OR_DEPARTMENT_OTHER): Payer: Self-pay | Admitting: Family

## 2014-12-29 VITALS — BP 132/74 | HR 90 | Temp 98.6°F | Ht 66.0 in | Wt 155.0 lb

## 2014-12-29 DIAGNOSIS — R591 Generalized enlarged lymph nodes: Secondary | ICD-10-CM | POA: Insufficient documentation

## 2014-12-29 NOTE — Progress Notes (Signed)
CHIEF COMPLAINT:   Chief Complaint   Patient presents with   . New Patient   Lymphadenopathy    INTERVAL HISTORY:   Jacqueline Terrell is a 44 year old female with left cervical lymphadenopathy x 5-6 months.  Recall she was previously seen by Dr. Alben Spittle on 05/14/2014 s/p Stage I Sleep Surgery (underwent septoplasty, B inf turb SMR/fx, SE 12/14/2013).  She returns today regarding lymphadenopathy.  She states that she suddenly noticed a small bump when she was rubbing her neck about 5-6 months ago.  She previously complained of an enlarged lymph node along her posterior left neck and h/o thyroglossal duct cyst and persistent throat fullness that was previously examined by Dr. Debbrah Alar in 04/23/2013.  She had some left neck pain x 1 week but does not have any today.  She thinks that the bump might be a little bigger since she first noticed it.  About 5 weeks ago she noticed a smaller bump in front of the original bump in the left neck that is not TTP.  She notices that sometimes she feels like she is swallowing around something or that she feels like she needs to concentrate on swallowing but denies coughing/choking with swallowing.  She has some hoarseness especially with increased talking and notices that she strains quickly.  She has occasional GERD and sometimes wakes up with severe nausea.  She occasionally takes [place].  She does report that she has had increased fatigue, night sweats, bloating, early satiety even with small meals, nausea, and SOB.  She states that she has some SOB with walking up a flight of stairs or even getting up and walking to the bathroom, which is not normal for her as she is a very active person.  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  She is scheduled to get a mammogram and abdominal imaging tomorrow.  She saw Dr. Kennith Maes at [facility] on 11/16/2014 at which time he recommended observation.    She denies fever, chills, unintentional weight loss, recent  illness.    REVIEW OF SYSTEMS:   Positive for OSA, allergic rhinitis.    Review of patient's allergies indicates:  Allergies   Allergen Reactions   . Sulfa Antibiotics    . Codeine        Outpatient Prescriptions Prior to Visit   Medication Sig Dispense Refill   . Ascorbic Acid (VITAMIN C OR)      . Cetirizine HCl (ZYRTEC ALLERGY OR)      . Ibuprofen 800 MG Oral Tab      . Multiple Vitamins-Minerals (MULTIVITAMIN OR)        No facility-administered medications prior to visit.       SOCIAL HISTORY:   Smoking: Quit in 1991  Diet: Regular    PHYSICAL EXAM:   VITAL SIGNS: BP 132/74 mmHg  Pulse 90  Temp(Src) 98.6 F (37 C) (Temporal)  Ht 5\' 6"  (1.676 m)  Wt 155 lb (70.308 kg)  BMI 25.03 kg/m2  SpO2 99%  GENERAL:  Well appearing female in no acute distress.  Nontoxic appearance.  Voice: mild roughness and strain  PSYCHIATRIC: A&Ox3, appropriate mood and affect.  RESPIRATORY: Normal respiratory effort.  No increased work of breathing, stridor, or wheeze.  NEUROLOGIC:  Cranial nerves II through XII are grossly intact.  Sensation and coordination within normal limits.  LYMPH: Subcentimeter nodules along left level IV/V, round, rubbery, mobile, nontender to palpation. No other lymphadenopathy of the neck.  MSK: NCAT.  No TTP over bilateral frontal or maxillary sinuses.  EYES: PERRL.  EOMI.  EARS: Bilateral EACs and TMs within normal limits.  NOSE: Nares patent.  Septum straight, midline, intact. Inferior turbinates reduced. Dorsum straight.  No lesions, masses, polyps, or mucopurulent discharge.  ORAL CAVITY: Moist mucosa, Modified Mallampati III, webbed palate, normal uvula, 2+ cryptic tonsils with tonsilloliths.  Tongue mobile, midline, rests above the occlusal plane.  Class I occlusion.  No lesions, masses, or posterior pharyngeal discharge.  NECK: Laryngotracheal structures midline.    DATA REVIEW:  O US SOFT TISSUE NECK12/04/2012   [facility]   Result Impression   IMPRESSION:  1. No soft tissue  abnormality is seen in the patient's area of concern  along the left lateral posterior neck. A small nonpathologically lymph  node is seen in the area.  2. 1.4 x 0.8 x 0.8 cm simple appearing cyst along the midline neck  corresponding to the cystic structure in the infrahyoid cervical midline  perhaps presenting a thyroglossal duct cyst however this is nonspecific.  .......  [facility] strives for timely and accurate reports.  Providers: Please call [phone] for any questions/concerns.  Patient: Please discuss clinical significance with your provider.     Result Narrative     EXAM: Soft tissue ultrasound left posterior neck    HISTORY: 44 year old with tenderness an small lymph node. Question of  thyroglossal duct cyst seen on previous CT.    COMPARISON: CT of 02/17/2013.    TECHNIQUE: Soft tissue ultrasound left posterior neck directed in the  area of concern.    FINDINGS: In the area patient concern along the left lateral posterior  neck no discrete soft tissue abnormality is identified. There is a small  6 x 4 x 2 mm lymph node in the area with fatty hilum.    Small nonpathologically enlarged bilateral cervical lymph nodes are noted.    Along the midline neck is a cystic structure measuring approximately 1.4  x 0.8 x 0.8 cm. This demonstrates no mural nodularity or solid component.  No internal vascularity is seen.       Result Type: CT Neck  Service Date: April 28, 2013 11:38   Result Status: Authenticated  Result Title: _RAD: CT NECK SOFT TISS W CONT  Performed By: [id number] Mickel Fuchs on April 28, 2013 17:38   Cosigned By: Smith Robert MD, [place]   Encounter info: [phone], [facility], Ancillary, 04/08/2013 - 05/08/2013  Contributor system: Shelly Coss    * Final Report *    Accession No: [id number]   ~OUTSIDE IMAGE INTERPRETATION  The clinical team for this patient has requested a review of these outside images to determine their adequacy for diagnosis and treatment planning, and to facilitate  clinical decision making.       RADIOGRAPHS AVAILABLE:  CT soft tissue neck with contrast.     TECHNIQUE(S) USED:  Axial CT images from the sella to the lung apices were obtained with IV contrast. In addition, coronal and sagittal reformations were performed.      CLINICAL INDICATION:    Thyroglossal duct cyst     FINDINGS:  Visualized intracranial contents demonstrate no definite enhancing lesion. No large hemorrhage or acute infarction. No hydrocephalus. Cavernous sinuses are patent. Orbits are unremarkable.     4 mm right thyroid lobe nodular hypodensity (4/64). Nonspecific.     Lung apices demonstrate no definite nodular opacity or consolidation     No suspicious lytic or blastic osseous  lesion.     Visualized mucosa of the aerodigestive tract demonstrate no definite enhancement or irregularity. No enlarged cervical lymph nodes by CT size criteria.     There is a 10 x 8mm smooth thin-walled cystic structure at the midline preepiglottic space (4/49).     IMPRESSION:        1. Smooth thin-walled cystic structure at the midline pre-epiglottic space. 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 Other etiologies considered much less likely.     2. Prominent, symmetric bilateral tonsils. Likely reactive. No definite findings to suggest phlegmon or abscess formation.  ATTENDING RADIOLOGIST AND PAGER NUMBER  [id number] Darcus Austin MD  [phone]    ASSESSMENT AND PLAN:   Shawne Bulow is a 44 year old female with left cervical nodes for 5-6 months that she reports is enlarging which may be lymphadenopathy or cysts.  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  I will follow up regarding [place] results and call her with further plan depending on results (she understands US guided FNA may be recommended  if US shows concerning findings).  She declined laryngoscopy today but may be amenable to Laryngology referral in the future.      Thank you for the opportunity to participate in the care of this patient.

## 2015-01-10 ENCOUNTER — Telehealth (HOSPITAL_BASED_OUTPATIENT_CLINIC_OR_DEPARTMENT_OTHER): Payer: Self-pay | Admitting: Family

## 2015-01-10 ENCOUNTER — Encounter (HOSPITAL_BASED_OUTPATIENT_CLINIC_OR_DEPARTMENT_OTHER): Payer: Self-pay | Admitting: Family

## 2015-01-10 DIAGNOSIS — R490 Dysphonia: Secondary | ICD-10-CM

## 2015-01-10 DIAGNOSIS — R0602 Shortness of breath: Secondary | ICD-10-CM

## 2015-01-10 DIAGNOSIS — R1319 Other dysphagia: Secondary | ICD-10-CM

## 2015-01-10 DIAGNOSIS — R591 Generalized enlarged lymph nodes: Secondary | ICD-10-CM

## 2015-01-10 NOTE — Telephone Encounter (Signed)
Called Jacqueline Terrell at listed number of [phone].  Discussed [place] head/neck results peformed at [facility] on 01/07/2015.  Results show lymph nodes in anterior neck midline and along the left SCM measuring about 1.4cm.  "Although slightly prominent, they are normal in morphology."     Discussed that given her complaints of swallowing around something, dysphonia, increased fatigue, night sweats, bloating, etc. I would like her to see one of our Laryngologists for laryngoscopy to r/o any other causes of her symptoms in her throat as she declined laryngoscopy at her visit with me on 12/29/2014.  Further plan (possible watchful waiting with serial [place], FNA, or excisional biopsy) will be discussed with Dr. Glendon Axe at her visit.  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  I will put in a referral for her to see Dr. Denyse Dago.    This patient was discussed with Dr. Denyse Dago, who directed the care plan.

## 2015-04-11 DIAGNOSIS — Z79891 Long term (current) use of opiate analgesic: Secondary | ICD-10-CM | POA: Diagnosis not present

## 2015-04-11 DIAGNOSIS — G894 Chronic pain syndrome: Secondary | ICD-10-CM | POA: Diagnosis not present

## 2015-04-11 DIAGNOSIS — G546 Phantom limb syndrome with pain: Secondary | ICD-10-CM | POA: Diagnosis not present

## 2015-04-11 DIAGNOSIS — M545 Low back pain: Secondary | ICD-10-CM | POA: Diagnosis not present

## 2015-05-04 DIAGNOSIS — M7711 Lateral epicondylitis, right elbow: Secondary | ICD-10-CM | POA: Diagnosis not present

## 2015-05-04 DIAGNOSIS — K219 Gastro-esophageal reflux disease without esophagitis: Secondary | ICD-10-CM | POA: Diagnosis not present

## 2015-05-04 DIAGNOSIS — I10 Essential (primary) hypertension: Secondary | ICD-10-CM | POA: Diagnosis not present

## 2015-05-04 DIAGNOSIS — E782 Mixed hyperlipidemia: Secondary | ICD-10-CM | POA: Diagnosis not present

## 2015-06-05 DIAGNOSIS — Z5181 Encounter for therapeutic drug level monitoring: Secondary | ICD-10-CM | POA: Diagnosis not present

## 2015-07-03 DIAGNOSIS — E782 Mixed hyperlipidemia: Secondary | ICD-10-CM | POA: Diagnosis not present

## 2015-07-03 DIAGNOSIS — G546 Phantom limb syndrome with pain: Secondary | ICD-10-CM | POA: Diagnosis not present

## 2015-07-03 DIAGNOSIS — F3341 Major depressive disorder, recurrent, in partial remission: Secondary | ICD-10-CM | POA: Diagnosis not present

## 2015-07-03 DIAGNOSIS — M25561 Pain in right knee: Secondary | ICD-10-CM | POA: Diagnosis not present

## 2015-07-03 DIAGNOSIS — I10 Essential (primary) hypertension: Secondary | ICD-10-CM | POA: Diagnosis not present

## 2015-08-02 DIAGNOSIS — Z5181 Encounter for therapeutic drug level monitoring: Secondary | ICD-10-CM | POA: Diagnosis not present

## 2015-08-24 DIAGNOSIS — E782 Mixed hyperlipidemia: Secondary | ICD-10-CM | POA: Diagnosis not present

## 2015-08-24 DIAGNOSIS — R5383 Other fatigue: Secondary | ICD-10-CM | POA: Diagnosis not present

## 2015-08-24 DIAGNOSIS — G546 Phantom limb syndrome with pain: Secondary | ICD-10-CM | POA: Diagnosis not present

## 2015-08-24 DIAGNOSIS — I10 Essential (primary) hypertension: Secondary | ICD-10-CM | POA: Diagnosis not present

## 2015-08-24 DIAGNOSIS — E559 Vitamin D deficiency, unspecified: Secondary | ICD-10-CM | POA: Diagnosis not present

## 2015-08-24 DIAGNOSIS — E038 Other specified hypothyroidism: Secondary | ICD-10-CM | POA: Diagnosis not present

## 2015-08-24 DIAGNOSIS — R7301 Impaired fasting glucose: Secondary | ICD-10-CM | POA: Diagnosis not present

## 2015-08-24 DIAGNOSIS — D518 Other vitamin B12 deficiency anemias: Secondary | ICD-10-CM | POA: Diagnosis not present

## 2015-08-24 DIAGNOSIS — K219 Gastro-esophageal reflux disease without esophagitis: Secondary | ICD-10-CM | POA: Diagnosis not present

## 2015-08-31 DIAGNOSIS — G546 Phantom limb syndrome with pain: Secondary | ICD-10-CM | POA: Diagnosis not present

## 2015-08-31 DIAGNOSIS — Z89612 Acquired absence of left leg above knee: Secondary | ICD-10-CM | POA: Diagnosis not present

## 2015-08-31 DIAGNOSIS — Z89611 Acquired absence of right leg above knee: Secondary | ICD-10-CM | POA: Diagnosis not present

## 2015-08-31 DIAGNOSIS — M6281 Muscle weakness (generalized): Secondary | ICD-10-CM | POA: Diagnosis not present

## 2015-09-28 DIAGNOSIS — R7301 Impaired fasting glucose: Secondary | ICD-10-CM | POA: Diagnosis not present

## 2015-09-28 DIAGNOSIS — G546 Phantom limb syndrome with pain: Secondary | ICD-10-CM | POA: Diagnosis not present

## 2015-09-28 DIAGNOSIS — E782 Mixed hyperlipidemia: Secondary | ICD-10-CM | POA: Diagnosis not present

## 2015-09-28 DIAGNOSIS — I10 Essential (primary) hypertension: Secondary | ICD-10-CM | POA: Diagnosis not present

## 2015-09-28 DIAGNOSIS — R5383 Other fatigue: Secondary | ICD-10-CM | POA: Diagnosis not present

## 2015-10-11 DIAGNOSIS — R1030 Lower abdominal pain, unspecified: Secondary | ICD-10-CM | POA: Diagnosis not present

## 2015-10-11 DIAGNOSIS — R102 Pelvic and perineal pain: Secondary | ICD-10-CM | POA: Diagnosis not present

## 2015-10-11 DIAGNOSIS — G546 Phantom limb syndrome with pain: Secondary | ICD-10-CM | POA: Diagnosis not present

## 2015-10-11 DIAGNOSIS — I10 Essential (primary) hypertension: Secondary | ICD-10-CM | POA: Diagnosis not present

## 2015-10-11 DIAGNOSIS — F329 Major depressive disorder, single episode, unspecified: Secondary | ICD-10-CM | POA: Diagnosis not present

## 2015-10-11 DIAGNOSIS — R1084 Generalized abdominal pain: Secondary | ICD-10-CM | POA: Diagnosis not present

## 2015-10-13 DIAGNOSIS — Z89612 Acquired absence of left leg above knee: Secondary | ICD-10-CM | POA: Diagnosis not present

## 2015-10-13 DIAGNOSIS — I69998 Other sequelae following unspecified cerebrovascular disease: Secondary | ICD-10-CM | POA: Diagnosis not present

## 2015-10-13 DIAGNOSIS — G546 Phantom limb syndrome with pain: Secondary | ICD-10-CM | POA: Diagnosis not present

## 2015-10-13 DIAGNOSIS — Z89611 Acquired absence of right leg above knee: Secondary | ICD-10-CM | POA: Diagnosis not present

## 2015-10-26 DIAGNOSIS — F3341 Major depressive disorder, recurrent, in partial remission: Secondary | ICD-10-CM | POA: Diagnosis not present

## 2015-10-26 DIAGNOSIS — G894 Chronic pain syndrome: Secondary | ICD-10-CM | POA: Diagnosis not present

## 2015-10-26 DIAGNOSIS — Z79899 Other long term (current) drug therapy: Secondary | ICD-10-CM | POA: Diagnosis not present

## 2015-10-26 DIAGNOSIS — G546 Phantom limb syndrome with pain: Secondary | ICD-10-CM | POA: Diagnosis not present

## 2015-10-26 DIAGNOSIS — D518 Other vitamin B12 deficiency anemias: Secondary | ICD-10-CM | POA: Diagnosis not present

## 2015-11-13 DIAGNOSIS — G546 Phantom limb syndrome with pain: Secondary | ICD-10-CM | POA: Diagnosis not present

## 2015-11-13 DIAGNOSIS — I69998 Other sequelae following unspecified cerebrovascular disease: Secondary | ICD-10-CM | POA: Diagnosis not present

## 2015-11-13 DIAGNOSIS — Z89612 Acquired absence of left leg above knee: Secondary | ICD-10-CM | POA: Diagnosis not present

## 2015-11-13 DIAGNOSIS — Z89611 Acquired absence of right leg above knee: Secondary | ICD-10-CM | POA: Diagnosis not present

## 2015-11-28 DIAGNOSIS — G546 Phantom limb syndrome with pain: Secondary | ICD-10-CM | POA: Diagnosis not present

## 2015-11-28 DIAGNOSIS — Z89612 Acquired absence of left leg above knee: Secondary | ICD-10-CM | POA: Diagnosis not present

## 2015-11-28 DIAGNOSIS — G894 Chronic pain syndrome: Secondary | ICD-10-CM | POA: Diagnosis not present

## 2015-11-28 DIAGNOSIS — Z89611 Acquired absence of right leg above knee: Secondary | ICD-10-CM | POA: Diagnosis not present

## 2015-12-14 DIAGNOSIS — G546 Phantom limb syndrome with pain: Secondary | ICD-10-CM | POA: Diagnosis not present

## 2015-12-14 DIAGNOSIS — Z89612 Acquired absence of left leg above knee: Secondary | ICD-10-CM | POA: Diagnosis not present

## 2015-12-14 DIAGNOSIS — Z89611 Acquired absence of right leg above knee: Secondary | ICD-10-CM | POA: Diagnosis not present

## 2015-12-14 DIAGNOSIS — I69998 Other sequelae following unspecified cerebrovascular disease: Secondary | ICD-10-CM | POA: Diagnosis not present

## 2015-12-28 DIAGNOSIS — K219 Gastro-esophageal reflux disease without esophagitis: Secondary | ICD-10-CM | POA: Diagnosis not present

## 2015-12-28 DIAGNOSIS — I10 Essential (primary) hypertension: Secondary | ICD-10-CM | POA: Diagnosis not present

## 2015-12-28 DIAGNOSIS — G546 Phantom limb syndrome with pain: Secondary | ICD-10-CM | POA: Diagnosis not present

## 2015-12-28 DIAGNOSIS — F411 Generalized anxiety disorder: Secondary | ICD-10-CM | POA: Diagnosis not present

## 2016-01-13 DIAGNOSIS — G546 Phantom limb syndrome with pain: Secondary | ICD-10-CM | POA: Diagnosis not present

## 2016-01-13 DIAGNOSIS — Z89612 Acquired absence of left leg above knee: Secondary | ICD-10-CM | POA: Diagnosis not present

## 2016-01-13 DIAGNOSIS — Z89611 Acquired absence of right leg above knee: Secondary | ICD-10-CM | POA: Diagnosis not present

## 2016-01-13 DIAGNOSIS — I69998 Other sequelae following unspecified cerebrovascular disease: Secondary | ICD-10-CM | POA: Diagnosis not present

## 2016-01-22 DIAGNOSIS — I10 Essential (primary) hypertension: Secondary | ICD-10-CM | POA: Diagnosis not present

## 2016-01-22 DIAGNOSIS — I517 Cardiomegaly: Secondary | ICD-10-CM | POA: Diagnosis not present

## 2016-02-13 DIAGNOSIS — Z89611 Acquired absence of right leg above knee: Secondary | ICD-10-CM | POA: Diagnosis not present

## 2016-02-13 DIAGNOSIS — I69998 Other sequelae following unspecified cerebrovascular disease: Secondary | ICD-10-CM | POA: Diagnosis not present

## 2016-02-13 DIAGNOSIS — G546 Phantom limb syndrome with pain: Secondary | ICD-10-CM | POA: Diagnosis not present

## 2016-02-13 DIAGNOSIS — Z89612 Acquired absence of left leg above knee: Secondary | ICD-10-CM | POA: Diagnosis not present

## 2016-02-23 DIAGNOSIS — F3341 Major depressive disorder, recurrent, in partial remission: Secondary | ICD-10-CM | POA: Diagnosis not present

## 2016-02-23 DIAGNOSIS — G894 Chronic pain syndrome: Secondary | ICD-10-CM | POA: Diagnosis not present

## 2016-02-23 DIAGNOSIS — F411 Generalized anxiety disorder: Secondary | ICD-10-CM | POA: Diagnosis not present

## 2016-02-23 DIAGNOSIS — G546 Phantom limb syndrome with pain: Secondary | ICD-10-CM | POA: Diagnosis not present

## 2016-03-04 DIAGNOSIS — Z1231 Encounter for screening mammogram for malignant neoplasm of breast: Secondary | ICD-10-CM | POA: Diagnosis not present

## 2016-03-14 DIAGNOSIS — Z89612 Acquired absence of left leg above knee: Secondary | ICD-10-CM | POA: Diagnosis not present

## 2016-03-14 DIAGNOSIS — G546 Phantom limb syndrome with pain: Secondary | ICD-10-CM | POA: Diagnosis not present

## 2016-03-14 DIAGNOSIS — Z89611 Acquired absence of right leg above knee: Secondary | ICD-10-CM | POA: Diagnosis not present

## 2016-03-14 DIAGNOSIS — I69998 Other sequelae following unspecified cerebrovascular disease: Secondary | ICD-10-CM | POA: Diagnosis not present

## 2016-03-22 DIAGNOSIS — G894 Chronic pain syndrome: Secondary | ICD-10-CM | POA: Diagnosis not present

## 2016-03-22 DIAGNOSIS — F329 Major depressive disorder, single episode, unspecified: Secondary | ICD-10-CM | POA: Diagnosis not present

## 2016-03-22 DIAGNOSIS — G546 Phantom limb syndrome with pain: Secondary | ICD-10-CM | POA: Diagnosis not present

## 2016-03-22 DIAGNOSIS — I10 Essential (primary) hypertension: Secondary | ICD-10-CM | POA: Diagnosis not present

## 2016-04-14 DIAGNOSIS — Z89611 Acquired absence of right leg above knee: Secondary | ICD-10-CM | POA: Diagnosis not present

## 2016-04-14 DIAGNOSIS — Z89612 Acquired absence of left leg above knee: Secondary | ICD-10-CM | POA: Diagnosis not present

## 2016-04-14 DIAGNOSIS — G546 Phantom limb syndrome with pain: Secondary | ICD-10-CM | POA: Diagnosis not present

## 2016-04-14 DIAGNOSIS — I69998 Other sequelae following unspecified cerebrovascular disease: Secondary | ICD-10-CM | POA: Diagnosis not present

## 2016-04-22 DIAGNOSIS — K219 Gastro-esophageal reflux disease without esophagitis: Secondary | ICD-10-CM | POA: Diagnosis not present

## 2016-04-22 DIAGNOSIS — E038 Other specified hypothyroidism: Secondary | ICD-10-CM | POA: Diagnosis not present

## 2016-04-22 DIAGNOSIS — G894 Chronic pain syndrome: Secondary | ICD-10-CM | POA: Diagnosis not present

## 2016-04-22 DIAGNOSIS — Z79899 Other long term (current) drug therapy: Secondary | ICD-10-CM | POA: Diagnosis not present

## 2016-04-22 DIAGNOSIS — I10 Essential (primary) hypertension: Secondary | ICD-10-CM | POA: Diagnosis not present

## 2016-05-09 DIAGNOSIS — E038 Other specified hypothyroidism: Secondary | ICD-10-CM | POA: Diagnosis not present

## 2016-05-09 DIAGNOSIS — I10 Essential (primary) hypertension: Secondary | ICD-10-CM | POA: Diagnosis not present

## 2016-05-09 DIAGNOSIS — F329 Major depressive disorder, single episode, unspecified: Secondary | ICD-10-CM | POA: Diagnosis not present

## 2016-05-09 DIAGNOSIS — D518 Other vitamin B12 deficiency anemias: Secondary | ICD-10-CM | POA: Diagnosis not present

## 2016-05-29 DIAGNOSIS — M7712 Lateral epicondylitis, left elbow: Secondary | ICD-10-CM | POA: Diagnosis not present

## 2016-05-29 DIAGNOSIS — E611 Iron deficiency: Secondary | ICD-10-CM | POA: Diagnosis not present

## 2016-05-29 DIAGNOSIS — G894 Chronic pain syndrome: Secondary | ICD-10-CM | POA: Diagnosis not present

## 2016-05-29 DIAGNOSIS — E782 Mixed hyperlipidemia: Secondary | ICD-10-CM | POA: Diagnosis not present

## 2016-05-29 DIAGNOSIS — G546 Phantom limb syndrome with pain: Secondary | ICD-10-CM | POA: Diagnosis not present

## 2016-05-29 DIAGNOSIS — R7301 Impaired fasting glucose: Secondary | ICD-10-CM | POA: Diagnosis not present

## 2016-05-29 DIAGNOSIS — E038 Other specified hypothyroidism: Secondary | ICD-10-CM | POA: Diagnosis not present

## 2016-05-29 DIAGNOSIS — E559 Vitamin D deficiency, unspecified: Secondary | ICD-10-CM | POA: Diagnosis not present

## 2016-05-29 DIAGNOSIS — D511 Vitamin B12 deficiency anemia due to selective vitamin B12 malabsorption with proteinuria: Secondary | ICD-10-CM | POA: Diagnosis not present

## 2016-05-29 DIAGNOSIS — F3341 Major depressive disorder, recurrent, in partial remission: Secondary | ICD-10-CM | POA: Diagnosis not present

## 2016-06-26 DIAGNOSIS — Z5181 Encounter for therapeutic drug level monitoring: Secondary | ICD-10-CM | POA: Diagnosis not present

## 2016-07-01 DIAGNOSIS — D518 Other vitamin B12 deficiency anemias: Secondary | ICD-10-CM | POA: Diagnosis not present

## 2016-07-01 DIAGNOSIS — I10 Essential (primary) hypertension: Secondary | ICD-10-CM | POA: Diagnosis not present

## 2016-07-01 DIAGNOSIS — F329 Major depressive disorder, single episode, unspecified: Secondary | ICD-10-CM | POA: Diagnosis not present

## 2016-07-01 DIAGNOSIS — E038 Other specified hypothyroidism: Secondary | ICD-10-CM | POA: Diagnosis not present

## 2016-07-21 DIAGNOSIS — W07XXXA Fall from chair, initial encounter: Secondary | ICD-10-CM | POA: Diagnosis not present

## 2016-07-21 DIAGNOSIS — S0990XA Unspecified injury of head, initial encounter: Secondary | ICD-10-CM | POA: Diagnosis not present

## 2016-07-21 DIAGNOSIS — R220 Localized swelling, mass and lump, head: Secondary | ICD-10-CM | POA: Diagnosis not present

## 2016-07-21 DIAGNOSIS — S199XXA Unspecified injury of neck, initial encounter: Secondary | ICD-10-CM | POA: Diagnosis not present

## 2016-07-21 DIAGNOSIS — S0993XA Unspecified injury of face, initial encounter: Secondary | ICD-10-CM | POA: Diagnosis not present

## 2016-07-21 DIAGNOSIS — S0181XA Laceration without foreign body of other part of head, initial encounter: Secondary | ICD-10-CM | POA: Diagnosis not present

## 2016-07-25 DIAGNOSIS — G894 Chronic pain syndrome: Secondary | ICD-10-CM | POA: Diagnosis not present

## 2016-07-25 DIAGNOSIS — E782 Mixed hyperlipidemia: Secondary | ICD-10-CM | POA: Diagnosis not present

## 2016-07-25 DIAGNOSIS — D518 Other vitamin B12 deficiency anemias: Secondary | ICD-10-CM | POA: Diagnosis not present

## 2016-07-25 DIAGNOSIS — D511 Vitamin B12 deficiency anemia due to selective vitamin B12 malabsorption with proteinuria: Secondary | ICD-10-CM | POA: Diagnosis not present

## 2016-07-25 DIAGNOSIS — E038 Other specified hypothyroidism: Secondary | ICD-10-CM | POA: Diagnosis not present

## 2016-07-30 DIAGNOSIS — D518 Other vitamin B12 deficiency anemias: Secondary | ICD-10-CM | POA: Diagnosis not present

## 2016-07-30 DIAGNOSIS — I10 Essential (primary) hypertension: Secondary | ICD-10-CM | POA: Diagnosis not present

## 2016-07-30 DIAGNOSIS — E038 Other specified hypothyroidism: Secondary | ICD-10-CM | POA: Diagnosis not present

## 2016-07-30 DIAGNOSIS — F329 Major depressive disorder, single episode, unspecified: Secondary | ICD-10-CM | POA: Diagnosis not present

## 2016-08-23 DIAGNOSIS — Z89611 Acquired absence of right leg above knee: Secondary | ICD-10-CM | POA: Diagnosis not present

## 2016-08-23 DIAGNOSIS — G894 Chronic pain syndrome: Secondary | ICD-10-CM | POA: Diagnosis not present

## 2016-08-23 DIAGNOSIS — Z89612 Acquired absence of left leg above knee: Secondary | ICD-10-CM | POA: Diagnosis not present

## 2016-08-23 DIAGNOSIS — G546 Phantom limb syndrome with pain: Secondary | ICD-10-CM | POA: Diagnosis not present

## 2016-08-27 DIAGNOSIS — F329 Major depressive disorder, single episode, unspecified: Secondary | ICD-10-CM | POA: Diagnosis not present

## 2016-08-27 DIAGNOSIS — I10 Essential (primary) hypertension: Secondary | ICD-10-CM | POA: Diagnosis not present

## 2016-08-27 DIAGNOSIS — E038 Other specified hypothyroidism: Secondary | ICD-10-CM | POA: Diagnosis not present

## 2016-08-27 DIAGNOSIS — D518 Other vitamin B12 deficiency anemias: Secondary | ICD-10-CM | POA: Diagnosis not present

## 2016-09-09 DIAGNOSIS — G546 Phantom limb syndrome with pain: Secondary | ICD-10-CM | POA: Diagnosis not present

## 2016-09-23 DIAGNOSIS — G546 Phantom limb syndrome with pain: Secondary | ICD-10-CM | POA: Diagnosis not present

## 2016-09-23 DIAGNOSIS — Z89611 Acquired absence of right leg above knee: Secondary | ICD-10-CM | POA: Diagnosis not present

## 2016-09-23 DIAGNOSIS — Z89612 Acquired absence of left leg above knee: Secondary | ICD-10-CM | POA: Diagnosis not present

## 2016-09-23 DIAGNOSIS — G894 Chronic pain syndrome: Secondary | ICD-10-CM | POA: Diagnosis not present

## 2016-09-23 DIAGNOSIS — D518 Other vitamin B12 deficiency anemias: Secondary | ICD-10-CM | POA: Diagnosis not present

## 2016-09-27 DIAGNOSIS — E038 Other specified hypothyroidism: Secondary | ICD-10-CM | POA: Diagnosis not present

## 2016-09-27 DIAGNOSIS — F329 Major depressive disorder, single episode, unspecified: Secondary | ICD-10-CM | POA: Diagnosis not present

## 2016-09-27 DIAGNOSIS — D518 Other vitamin B12 deficiency anemias: Secondary | ICD-10-CM | POA: Diagnosis not present

## 2016-09-27 DIAGNOSIS — I10 Essential (primary) hypertension: Secondary | ICD-10-CM | POA: Diagnosis not present

## 2016-10-23 DIAGNOSIS — G894 Chronic pain syndrome: Secondary | ICD-10-CM | POA: Diagnosis not present

## 2016-10-23 DIAGNOSIS — D511 Vitamin B12 deficiency anemia due to selective vitamin B12 malabsorption with proteinuria: Secondary | ICD-10-CM | POA: Diagnosis not present

## 2016-10-23 DIAGNOSIS — G546 Phantom limb syndrome with pain: Secondary | ICD-10-CM | POA: Diagnosis not present

## 2016-10-23 DIAGNOSIS — F3341 Major depressive disorder, recurrent, in partial remission: Secondary | ICD-10-CM | POA: Diagnosis not present

## 2016-10-31 ENCOUNTER — Encounter (HOSPITAL_COMMUNITY): Payer: Self-pay

## 2016-11-15 DIAGNOSIS — G894 Chronic pain syndrome: Secondary | ICD-10-CM | POA: Diagnosis not present

## 2016-11-15 DIAGNOSIS — Z89612 Acquired absence of left leg above knee: Secondary | ICD-10-CM | POA: Diagnosis not present

## 2016-11-15 DIAGNOSIS — Z89611 Acquired absence of right leg above knee: Secondary | ICD-10-CM | POA: Diagnosis not present

## 2016-11-15 DIAGNOSIS — I10 Essential (primary) hypertension: Secondary | ICD-10-CM | POA: Diagnosis not present

## 2016-12-17 DIAGNOSIS — D518 Other vitamin B12 deficiency anemias: Secondary | ICD-10-CM | POA: Diagnosis not present

## 2016-12-17 DIAGNOSIS — G894 Chronic pain syndrome: Secondary | ICD-10-CM | POA: Diagnosis not present

## 2016-12-17 DIAGNOSIS — Z89611 Acquired absence of right leg above knee: Secondary | ICD-10-CM | POA: Diagnosis not present

## 2016-12-17 DIAGNOSIS — Z89612 Acquired absence of left leg above knee: Secondary | ICD-10-CM | POA: Diagnosis not present

## 2017-02-13 DIAGNOSIS — J069 Acute upper respiratory infection, unspecified: Secondary | ICD-10-CM | POA: Diagnosis not present

## 2017-02-13 DIAGNOSIS — Z89612 Acquired absence of left leg above knee: Secondary | ICD-10-CM | POA: Diagnosis not present

## 2017-02-13 DIAGNOSIS — J302 Other seasonal allergic rhinitis: Secondary | ICD-10-CM | POA: Diagnosis not present

## 2017-02-13 DIAGNOSIS — G546 Phantom limb syndrome with pain: Secondary | ICD-10-CM | POA: Diagnosis not present

## 2017-02-13 DIAGNOSIS — G894 Chronic pain syndrome: Secondary | ICD-10-CM | POA: Diagnosis not present

## 2017-03-13 DIAGNOSIS — Z89611 Acquired absence of right leg above knee: Secondary | ICD-10-CM | POA: Diagnosis not present

## 2017-03-13 DIAGNOSIS — Z89612 Acquired absence of left leg above knee: Secondary | ICD-10-CM | POA: Diagnosis not present

## 2017-03-13 DIAGNOSIS — E038 Other specified hypothyroidism: Secondary | ICD-10-CM | POA: Diagnosis not present

## 2017-03-13 DIAGNOSIS — G546 Phantom limb syndrome with pain: Secondary | ICD-10-CM | POA: Diagnosis not present

## 2017-03-13 DIAGNOSIS — Z5181 Encounter for therapeutic drug level monitoring: Secondary | ICD-10-CM | POA: Diagnosis not present

## 2017-04-02 DIAGNOSIS — Z1231 Encounter for screening mammogram for malignant neoplasm of breast: Secondary | ICD-10-CM | POA: Diagnosis not present

## 2017-04-11 DIAGNOSIS — G546 Phantom limb syndrome with pain: Secondary | ICD-10-CM | POA: Diagnosis not present

## 2017-04-11 DIAGNOSIS — I10 Essential (primary) hypertension: Secondary | ICD-10-CM | POA: Diagnosis not present

## 2017-04-11 DIAGNOSIS — Z89611 Acquired absence of right leg above knee: Secondary | ICD-10-CM | POA: Diagnosis not present

## 2017-04-11 DIAGNOSIS — G894 Chronic pain syndrome: Secondary | ICD-10-CM | POA: Diagnosis not present

## 2017-04-11 DIAGNOSIS — Z79899 Other long term (current) drug therapy: Secondary | ICD-10-CM | POA: Diagnosis not present

## 2017-04-11 DIAGNOSIS — Z5181 Encounter for therapeutic drug level monitoring: Secondary | ICD-10-CM | POA: Diagnosis not present

## 2017-05-09 DIAGNOSIS — Z23 Encounter for immunization: Secondary | ICD-10-CM | POA: Diagnosis not present

## 2017-05-09 DIAGNOSIS — Z89611 Acquired absence of right leg above knee: Secondary | ICD-10-CM | POA: Diagnosis not present

## 2017-05-09 DIAGNOSIS — D511 Vitamin B12 deficiency anemia due to selective vitamin B12 malabsorption with proteinuria: Secondary | ICD-10-CM | POA: Diagnosis not present

## 2017-05-09 DIAGNOSIS — E038 Other specified hypothyroidism: Secondary | ICD-10-CM | POA: Diagnosis not present

## 2017-05-09 DIAGNOSIS — Z0001 Encounter for general adult medical examination with abnormal findings: Secondary | ICD-10-CM | POA: Diagnosis not present

## 2017-05-09 DIAGNOSIS — Z5181 Encounter for therapeutic drug level monitoring: Secondary | ICD-10-CM | POA: Diagnosis not present

## 2017-05-09 DIAGNOSIS — G546 Phantom limb syndrome with pain: Secondary | ICD-10-CM | POA: Diagnosis not present

## 2017-05-09 DIAGNOSIS — I10 Essential (primary) hypertension: Secondary | ICD-10-CM | POA: Diagnosis not present

## 2017-05-09 DIAGNOSIS — E782 Mixed hyperlipidemia: Secondary | ICD-10-CM | POA: Diagnosis not present

## 2017-05-09 DIAGNOSIS — Z89612 Acquired absence of left leg above knee: Secondary | ICD-10-CM | POA: Diagnosis not present

## 2017-05-12 DIAGNOSIS — G546 Phantom limb syndrome with pain: Secondary | ICD-10-CM | POA: Diagnosis not present

## 2017-05-12 DIAGNOSIS — R21 Rash and other nonspecific skin eruption: Secondary | ICD-10-CM | POA: Diagnosis not present

## 2017-05-12 DIAGNOSIS — Z89612 Acquired absence of left leg above knee: Secondary | ICD-10-CM | POA: Diagnosis not present

## 2017-05-12 DIAGNOSIS — I10 Essential (primary) hypertension: Secondary | ICD-10-CM | POA: Diagnosis not present

## 2017-05-19 DIAGNOSIS — R05 Cough: Secondary | ICD-10-CM | POA: Diagnosis not present

## 2017-05-19 DIAGNOSIS — M7918 Myalgia, other site: Secondary | ICD-10-CM | POA: Diagnosis not present

## 2017-05-19 DIAGNOSIS — J069 Acute upper respiratory infection, unspecified: Secondary | ICD-10-CM | POA: Diagnosis not present

## 2017-05-19 DIAGNOSIS — J208 Acute bronchitis due to other specified organisms: Secondary | ICD-10-CM | POA: Diagnosis not present

## 2017-06-06 DIAGNOSIS — I10 Essential (primary) hypertension: Secondary | ICD-10-CM | POA: Diagnosis not present

## 2017-06-06 DIAGNOSIS — G546 Phantom limb syndrome with pain: Secondary | ICD-10-CM | POA: Diagnosis not present

## 2017-06-06 DIAGNOSIS — K219 Gastro-esophageal reflux disease without esophagitis: Secondary | ICD-10-CM | POA: Diagnosis not present

## 2017-06-06 DIAGNOSIS — Z5181 Encounter for therapeutic drug level monitoring: Secondary | ICD-10-CM | POA: Diagnosis not present

## 2017-07-04 DIAGNOSIS — Z79899 Other long term (current) drug therapy: Secondary | ICD-10-CM | POA: Diagnosis not present

## 2017-07-04 DIAGNOSIS — K219 Gastro-esophageal reflux disease without esophagitis: Secondary | ICD-10-CM | POA: Diagnosis not present

## 2017-07-04 DIAGNOSIS — Z5181 Encounter for therapeutic drug level monitoring: Secondary | ICD-10-CM | POA: Diagnosis not present

## 2017-07-04 DIAGNOSIS — G546 Phantom limb syndrome with pain: Secondary | ICD-10-CM | POA: Diagnosis not present

## 2017-07-04 DIAGNOSIS — F411 Generalized anxiety disorder: Secondary | ICD-10-CM | POA: Diagnosis not present

## 2017-07-04 DIAGNOSIS — I10 Essential (primary) hypertension: Secondary | ICD-10-CM | POA: Diagnosis not present

## 2017-08-01 DIAGNOSIS — Z5181 Encounter for therapeutic drug level monitoring: Secondary | ICD-10-CM | POA: Diagnosis not present

## 2017-08-01 DIAGNOSIS — K219 Gastro-esophageal reflux disease without esophagitis: Secondary | ICD-10-CM | POA: Diagnosis not present

## 2017-08-01 DIAGNOSIS — F411 Generalized anxiety disorder: Secondary | ICD-10-CM | POA: Diagnosis not present

## 2017-08-01 DIAGNOSIS — Z79899 Other long term (current) drug therapy: Secondary | ICD-10-CM | POA: Diagnosis not present

## 2017-08-01 DIAGNOSIS — G546 Phantom limb syndrome with pain: Secondary | ICD-10-CM | POA: Diagnosis not present

## 2017-08-01 DIAGNOSIS — I10 Essential (primary) hypertension: Secondary | ICD-10-CM | POA: Diagnosis not present

## 2017-08-29 DIAGNOSIS — R0602 Shortness of breath: Secondary | ICD-10-CM | POA: Diagnosis not present

## 2017-08-29 DIAGNOSIS — K219 Gastro-esophageal reflux disease without esophagitis: Secondary | ICD-10-CM | POA: Diagnosis not present

## 2017-08-29 DIAGNOSIS — G546 Phantom limb syndrome with pain: Secondary | ICD-10-CM | POA: Diagnosis not present

## 2017-08-29 DIAGNOSIS — I10 Essential (primary) hypertension: Secondary | ICD-10-CM | POA: Diagnosis not present

## 2017-08-29 DIAGNOSIS — J302 Other seasonal allergic rhinitis: Secondary | ICD-10-CM | POA: Diagnosis not present

## 2017-08-29 DIAGNOSIS — Z5181 Encounter for therapeutic drug level monitoring: Secondary | ICD-10-CM | POA: Diagnosis not present

## 2017-08-29 DIAGNOSIS — F411 Generalized anxiety disorder: Secondary | ICD-10-CM | POA: Diagnosis not present

## 2017-08-29 DIAGNOSIS — R0789 Other chest pain: Secondary | ICD-10-CM | POA: Diagnosis not present

## 2017-08-29 DIAGNOSIS — R0781 Pleurodynia: Secondary | ICD-10-CM | POA: Diagnosis not present

## 2017-08-29 DIAGNOSIS — R079 Chest pain, unspecified: Secondary | ICD-10-CM | POA: Diagnosis not present

## 2017-09-09 ENCOUNTER — Other Ambulatory Visit: Payer: Self-pay
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
## 2017-09-10 ENCOUNTER — Other Ambulatory Visit: Payer: Self-pay | Admitting: *Deleted

## 2017-09-10 NOTE — Patient Outreach (Signed)
[place] [facility]) Care Management  09/10/2017  Mallory Ayers 05-02-1970 [id number]   CSW attempted initial phone outreach to pt today without success. CSW left a HIPPA compliant message and will await call back. CSW will send unsuccessful outreach letter as well.  CSW will make 2nd outreach attempt in the next 2-3 business days if no return call is received.   [name], MSW, [place] Worker  [place] [phone]

## 2017-09-15 ENCOUNTER — Other Ambulatory Visit: Payer: Self-pay | Admitting: *Deleted

## 2017-09-15 NOTE — Patient Outreach (Signed)
[place] [facility]) Care Management  09/15/2017  Mallory Ayers 1970-04-21 [id number]   [place] contacted pt by phone today and left a HIPPA compliant message. CSW will try to reach pt again 09/16/2017.   [name], MSW, [place] Worker  [place] [phone]
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
## 2017-09-18 ENCOUNTER — Other Ambulatory Visit: Payer: Self-pay | Admitting: *Deleted

## 2017-09-18 ENCOUNTER — Encounter: Payer: Self-pay | Admitting: *Deleted
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
## 2017-09-29 ENCOUNTER — Ambulatory Visit: Payer: Self-pay | Admitting: *Deleted

## 2017-09-29 DIAGNOSIS — F411 Generalized anxiety disorder: Secondary | ICD-10-CM | POA: Diagnosis not present

## 2017-09-29 DIAGNOSIS — G546 Phantom limb syndrome with pain: Secondary | ICD-10-CM | POA: Diagnosis not present

## 2017-09-29 DIAGNOSIS — K219 Gastro-esophageal reflux disease without esophagitis: Secondary | ICD-10-CM | POA: Diagnosis not present

## 2017-09-29 DIAGNOSIS — I10 Essential (primary) hypertension: Secondary | ICD-10-CM | POA: Diagnosis not present

## 2017-09-30 ENCOUNTER — Ambulatory Visit: Payer: Self-pay | Admitting: *Deleted

## 2017-09-30 ENCOUNTER — Other Ambulatory Visit: Payer: Self-pay | Admitting: *Deleted

## 2017-09-30 NOTE — Patient Outreach (Signed)
[facility]) Care Management  09/30/2017  Mallory Ayers [birth year] [id number]   CSW attempted to reach pt by phone today and left a voice message for return call. CSW will try to reach pt again later this week if no return call received.  [name], MSW, [place] Worker  [place] [phone]

## 2017-10-02 ENCOUNTER — Ambulatory Visit: Payer: Self-pay | Admitting: *Deleted

## 2017-10-03 ENCOUNTER — Other Ambulatory Visit: Payer: Self-pay | Admitting: *Deleted

## 2017-10-07 ENCOUNTER — Other Ambulatory Visit: Payer: Self-pay | Admitting: *Deleted

## 2017-10-07 NOTE — Patient Outreach (Signed)
[place] [facility]) Care Management  10/07/2017  JANELL KEELING Dec 09, 1970 [id number]   CSW was able to make contact with pt today but she was babysitting and not able to talk due to crying baby. Pt plans to call back when convenient time to catch up.  CSW will try pt again next week if no return call is received to plan a home visit to discuss programs and resources obtained.   [name], MSW, [place] Worker  [place] [phone]

## 2017-10-15 ENCOUNTER — Ambulatory Visit: Payer: Self-pay | Admitting: *Deleted

## 2017-10-15 ENCOUNTER — Other Ambulatory Visit: Payer: Self-pay | Admitting: *Deleted

## 2017-10-15 NOTE — Patient Outreach (Signed)
[facility]) Care Management  10/15/2017  Mallory Ayers 04/04/1971 [id number]   CSW attempted to reach pt by phone today and was unsuccessful. CSW left HIPPA compliant voice message and will plan outreach again in the next 1-3 business days if no return call is received.   [name], MSW, [place] Worker  [place] [phone]

## 2017-10-20 ENCOUNTER — Ambulatory Visit: Payer: Self-pay | Admitting: *Deleted

## 2017-10-20 ENCOUNTER — Other Ambulatory Visit: Payer: Self-pay | Admitting: *Deleted

## 2017-10-20 NOTE — Patient Outreach (Signed)
[place] [facility]) Care Management  10/20/2017  CHERISSE CARRELL 12-Sep-1970 [id number]   CSW attempted to reach pt again today and left voice message. CSW will try again for 3rd outreach attempt in the next 7 days.   [name], MSW, [place] Worker  [place] [phone]

## 2017-10-24 ENCOUNTER — Ambulatory Visit: Payer: Self-pay | Admitting: *Deleted

## 2017-10-27 DIAGNOSIS — I10 Essential (primary) hypertension: Secondary | ICD-10-CM | POA: Diagnosis not present

## 2017-10-27 DIAGNOSIS — K219 Gastro-esophageal reflux disease without esophagitis: Secondary | ICD-10-CM | POA: Diagnosis not present

## 2017-10-27 DIAGNOSIS — F411 Generalized anxiety disorder: Secondary | ICD-10-CM | POA: Diagnosis not present

## 2017-10-27 DIAGNOSIS — Z5181 Encounter for therapeutic drug level monitoring: Secondary | ICD-10-CM | POA: Diagnosis not present

## 2017-10-27 DIAGNOSIS — G546 Phantom limb syndrome with pain: Secondary | ICD-10-CM | POA: Diagnosis not present

## 2017-10-30 ENCOUNTER — Other Ambulatory Visit: Payer: Self-pay | Admitting: *Deleted

## 2017-10-30 ENCOUNTER — Encounter: Payer: Self-pay | Admitting: *Deleted

## 2017-10-30 NOTE — Patient Outreach (Signed)
[place] [facility]) Care Management  [facility] Social Work  10/30/2017  Mallory Ayers March 03, 1971 [id number]  Subjective:  "It's been a busy time".  Objective: THN CSW to assist patient and family with community based resources to aide in their well-being, quality of life and overall safety and needs.    Current Medications:  Current Outpatient Medications  Medication Sig Dispense Refill  . ALPRAZolam (XANAX) 1 MG tablet Take 1 mg by mouth 3 (three) times daily as needed for anxiety.     . Biotin (PA BIOTIN) 5 MG CAPS Take 1 capsule by mouth at bedtime.     . bisoprolol (ZEBETA) 5 MG tablet Take 1 tablet (5 mg total) by mouth daily. (Patient not taking: Reported on 09/18/2017) 30 tablet 0  . enoxaparin (LOVENOX) 300 MG/3ML SOLN injection Inject 0.3 mLs (30 mg total) into the skin every 12 (twelve) hours. (Patient not taking: Reported on 09/18/2017) 42 vial 0  . FLUoxetine (PROZAC) 40 MG capsule Take 40 mg by mouth at bedtime.     Marland Kitchen HYDROmorphone (DILAUDID) 4 MG tablet Take 4 mg by mouth every 6 (six) hours as needed for severe pain.    . hydrOXYzine (ATARAX/VISTARIL) 25 MG tablet Take 25 mg by mouth at bedtime.     Marland Kitchen levothyroxine (SYNTHROID, LEVOTHROID) 150 MCG tablet Take 150 mcg by mouth daily before breakfast.    . loratadine (CLARITIN) 10 MG tablet Take 10 mg by mouth every morning.     Marland Kitchen LYRICA 225 MG capsule Take 225 mg by mouth 2 (two) times daily.     . Multiple Vitamins-Iron (MULTIVITAMINS WITH IRON) TABS tablet Take 1 tablet by mouth every morning.     . norethindrone (MICRONOR,CAMILA,ERRIN) 0.35 MG tablet Take 1 tablet by mouth at bedtime.     . Olopatadine HCl (PATADAY) 0.2 % SOLN Apply 1 drop to eye.    . ondansetron (ZOFRAN) 4 MG tablet Take 1 tablet (4 mg total) by mouth every 8 (eight) hours as needed for nausea or vomiting. 30 tablet 0  . OxyCODONE (OXYCONTIN) 10 mg T12A 12 hr tablet Take 10 mg by mouth every 12 (twelve) hours.    Marland Kitchen oxyCODONE (ROXICODONE) 15 MG  immediate release tablet Take 15 mg by mouth every 5 (five) hours as needed for pain.    . Oxycodone HCl 20 MG TABS Take 1 tablet by mouth 4 (four) times daily as needed (pain).     No current facility-administered medications for this visit.     Functional Status:  No flowsheet data found.  Fall/Depression Screening:  Fall Risk  09/18/2017  Risk for fall due to : History of fall(s)   PHQ 2/9 Scores 09/18/2017 09/09/2017  PHQ - 2 Score 2 1  PHQ- 9 Score 6 -    Assessment:  CSW spoke with pt who reports she is doing well; no complaints.   THN CM Care Plan Problem One     Most Recent Value  Care Plan Problem One  Patient needing in home assistance with housekeeping due to her limitations.  Role Documenting the Problem One  Clinical Social Worker  Care Plan for Problem One  Active  [facility] Long Term Goal   Patient will report receipt of community based resources to assist with housekeeping in the next 31 days.   THN Long Term Goal Start Date  09/22/17  [facility] Long Term Goal Met Date  10/30/17  Interventions for Problem One Long Term Goal  CSW discussed the services/resources mailed  and will also mail info to her on Voc Rehab.   THN CM Short Term Goal #1   Patient will have additional home support services/agencies info to consider in the next 30 days.   THN CM Short Term Goal #1 Start Date  09/22/17  [facility] CM Short Term Goal #1 Met Date  10/30/17  Interventions for Short Term Goal #1  Pt aware that the resources for bath aide/assistance are based on age, need and financial status. SHe is not eligible.   THN CM Short Term Goal #2   Patient will consider hiring additional home support in the next 30 days.   THN CM Short Term Goal #2 Start Date  09/22/17  [facility] CM Short Term Goal #2 Met Date  10/30/17  Interventions for Short Term Goal #2  Pt understands she can hire privately for assistance if desired.     THN CM Care Plan Problem Two     Most Recent Value  Care Plan Problem Two  Patient will  report less signs/symptoms of depression in the next 30 days.   Role Documenting the Problem Two  Clinical Social Worker  Care Plan for Problem Two  Active  Interventions for Problem Two Long Term Goal   CSW discussed and will mail pt info on Voc Rehab. Pt is considering some volunteer work as well as driving to [place] each Thursday- Sunday to help care for her grandbaby.   THN Long Term Goal  Patient will report improved quality of life and more activity/oppotunity in the next 45 days.   THN Long Term Goal Start Date  09/22/17  THN Long Term Goal Met Date  10/30/17  THN CM Short Term Goal #1   Pt will report receipt of opportunities for pt to review and consider for support, volunteering and other engagements to improve her QOL in the next 30 days.   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   THN CM Short Term Goal #2   Pt will report less "pity" parties and improvement in mental health state in the next 30 days.    THN CM Short Term Goal #2 Start Date  09/22/17  [facility] CM Short Term Goal #2 Met Date  10/30/17  Interventions for Short Term Goal #2  Pt in good spirits,  has had a busy month with family events, activiites and hopeful for helping with her grandbaby more.        Plan:   CSW discussed plans for case closure with pt. Pt agrees and understands suggestions and resources provided. Pt encouraged to outreach CSW if needs arise.  CSW will plan case closure and advise [facility] team and PCP.   [name], MSW, [place] Worker  [place] [phone]

## 2017-11-24 DIAGNOSIS — K219 Gastro-esophageal reflux disease without esophagitis: Secondary | ICD-10-CM | POA: Diagnosis not present

## 2017-11-24 DIAGNOSIS — J302 Other seasonal allergic rhinitis: Secondary | ICD-10-CM | POA: Diagnosis not present

## 2017-11-24 DIAGNOSIS — I10 Essential (primary) hypertension: Secondary | ICD-10-CM | POA: Diagnosis not present

## 2017-11-24 DIAGNOSIS — G546 Phantom limb syndrome with pain: Secondary | ICD-10-CM | POA: Diagnosis not present

## 2017-11-24 DIAGNOSIS — F411 Generalized anxiety disorder: Secondary | ICD-10-CM | POA: Diagnosis not present

## 2017-11-24 DIAGNOSIS — Z5181 Encounter for therapeutic drug level monitoring: Secondary | ICD-10-CM | POA: Diagnosis not present

## 2017-11-25 DIAGNOSIS — Z5181 Encounter for therapeutic drug level monitoring: Secondary | ICD-10-CM | POA: Diagnosis not present

## 2017-12-22 DIAGNOSIS — I10 Essential (primary) hypertension: Secondary | ICD-10-CM | POA: Diagnosis not present

## 2017-12-22 DIAGNOSIS — K219 Gastro-esophageal reflux disease without esophagitis: Secondary | ICD-10-CM | POA: Diagnosis not present

## 2017-12-22 DIAGNOSIS — F411 Generalized anxiety disorder: Secondary | ICD-10-CM | POA: Diagnosis not present

## 2017-12-22 DIAGNOSIS — Z5181 Encounter for therapeutic drug level monitoring: Secondary | ICD-10-CM | POA: Diagnosis not present

## 2017-12-22 DIAGNOSIS — G546 Phantom limb syndrome with pain: Secondary | ICD-10-CM | POA: Diagnosis not present

## 2018-01-19 DIAGNOSIS — J208 Acute bronchitis due to other specified organisms: Secondary | ICD-10-CM | POA: Diagnosis not present

## 2018-01-19 DIAGNOSIS — Z5181 Encounter for therapeutic drug level monitoring: Secondary | ICD-10-CM | POA: Diagnosis not present

## 2018-01-19 DIAGNOSIS — D518 Other vitamin B12 deficiency anemias: Secondary | ICD-10-CM | POA: Diagnosis not present

## 2018-01-19 DIAGNOSIS — L209 Atopic dermatitis, unspecified: Secondary | ICD-10-CM | POA: Diagnosis not present

## 2018-01-19 DIAGNOSIS — G546 Phantom limb syndrome with pain: Secondary | ICD-10-CM | POA: Diagnosis not present

## 2018-01-19 DIAGNOSIS — Z23 Encounter for immunization: Secondary | ICD-10-CM | POA: Diagnosis not present

## 2018-02-18 DIAGNOSIS — E119 Type 2 diabetes mellitus without complications: Secondary | ICD-10-CM | POA: Diagnosis not present

## 2018-02-18 DIAGNOSIS — E038 Other specified hypothyroidism: Secondary | ICD-10-CM | POA: Diagnosis not present

## 2018-02-18 DIAGNOSIS — F411 Generalized anxiety disorder: Secondary | ICD-10-CM | POA: Diagnosis not present

## 2018-02-18 DIAGNOSIS — K219 Gastro-esophageal reflux disease without esophagitis: Secondary | ICD-10-CM | POA: Diagnosis not present

## 2018-02-18 DIAGNOSIS — E559 Vitamin D deficiency, unspecified: Secondary | ICD-10-CM | POA: Diagnosis not present

## 2018-02-18 DIAGNOSIS — G546 Phantom limb syndrome with pain: Secondary | ICD-10-CM | POA: Diagnosis not present

## 2018-02-18 DIAGNOSIS — I10 Essential (primary) hypertension: Secondary | ICD-10-CM | POA: Diagnosis not present

## 2018-02-18 DIAGNOSIS — M79652 Pain in left thigh: Secondary | ICD-10-CM | POA: Diagnosis not present

## 2018-02-18 DIAGNOSIS — E782 Mixed hyperlipidemia: Secondary | ICD-10-CM | POA: Diagnosis not present

## 2018-02-18 DIAGNOSIS — D518 Other vitamin B12 deficiency anemias: Secondary | ICD-10-CM | POA: Diagnosis not present

## 2018-03-18 DIAGNOSIS — G546 Phantom limb syndrome with pain: Secondary | ICD-10-CM | POA: Diagnosis not present

## 2018-03-18 DIAGNOSIS — K219 Gastro-esophageal reflux disease without esophagitis: Secondary | ICD-10-CM | POA: Diagnosis not present

## 2018-03-18 DIAGNOSIS — Z5181 Encounter for therapeutic drug level monitoring: Secondary | ICD-10-CM | POA: Diagnosis not present

## 2018-03-18 DIAGNOSIS — I10 Essential (primary) hypertension: Secondary | ICD-10-CM | POA: Diagnosis not present

## 2018-03-18 DIAGNOSIS — F411 Generalized anxiety disorder: Secondary | ICD-10-CM | POA: Diagnosis not present

## 2018-04-16 DIAGNOSIS — F411 Generalized anxiety disorder: Secondary | ICD-10-CM | POA: Diagnosis not present

## 2018-04-16 DIAGNOSIS — Z5181 Encounter for therapeutic drug level monitoring: Secondary | ICD-10-CM | POA: Diagnosis not present

## 2018-04-16 DIAGNOSIS — K219 Gastro-esophageal reflux disease without esophagitis: Secondary | ICD-10-CM | POA: Diagnosis not present

## 2018-04-16 DIAGNOSIS — G546 Phantom limb syndrome with pain: Secondary | ICD-10-CM | POA: Diagnosis not present

## 2018-04-16 DIAGNOSIS — I10 Essential (primary) hypertension: Secondary | ICD-10-CM | POA: Diagnosis not present

## 2018-04-16 DIAGNOSIS — M79602 Pain in left arm: Secondary | ICD-10-CM | POA: Diagnosis not present

## 2018-04-23 DIAGNOSIS — E0590 Thyrotoxicosis, unspecified without thyrotoxic crisis or storm: Secondary | ICD-10-CM | POA: Diagnosis not present

## 2018-04-23 DIAGNOSIS — I361 Nonrheumatic tricuspid (valve) insufficiency: Secondary | ICD-10-CM | POA: Diagnosis not present

## 2018-04-23 DIAGNOSIS — I2699 Other pulmonary embolism without acute cor pulmonale: Secondary | ICD-10-CM | POA: Insufficient documentation

## 2018-04-23 DIAGNOSIS — J90 Pleural effusion, not elsewhere classified: Secondary | ICD-10-CM | POA: Diagnosis not present

## 2018-04-23 DIAGNOSIS — Z8249 Family history of ischemic heart disease and other diseases of the circulatory system: Secondary | ICD-10-CM | POA: Diagnosis not present

## 2018-04-23 DIAGNOSIS — R0602 Shortness of breath: Secondary | ICD-10-CM | POA: Diagnosis not present

## 2018-04-23 DIAGNOSIS — Z923 Personal history of irradiation: Secondary | ICD-10-CM | POA: Diagnosis not present

## 2018-04-23 DIAGNOSIS — F419 Anxiety disorder, unspecified: Secondary | ICD-10-CM | POA: Diagnosis not present

## 2018-04-23 DIAGNOSIS — I340 Nonrheumatic mitral (valve) insufficiency: Secondary | ICD-10-CM | POA: Diagnosis not present

## 2018-04-23 DIAGNOSIS — R0789 Other chest pain: Secondary | ICD-10-CM | POA: Diagnosis not present

## 2018-04-23 DIAGNOSIS — Z89612 Acquired absence of left leg above knee: Secondary | ICD-10-CM | POA: Diagnosis not present

## 2018-04-23 DIAGNOSIS — I2694 Multiple subsegmental pulmonary emboli without acute cor pulmonale: Secondary | ICD-10-CM | POA: Diagnosis not present

## 2018-04-23 DIAGNOSIS — R079 Chest pain, unspecified: Secondary | ICD-10-CM | POA: Diagnosis not present

## 2018-04-23 DIAGNOSIS — R091 Pleurisy: Secondary | ICD-10-CM | POA: Diagnosis not present

## 2018-04-23 DIAGNOSIS — Z98891 History of uterine scar from previous surgery: Secondary | ICD-10-CM | POA: Diagnosis not present

## 2018-04-23 DIAGNOSIS — Z79891 Long term (current) use of opiate analgesic: Secondary | ICD-10-CM | POA: Diagnosis not present

## 2018-04-23 DIAGNOSIS — R946 Abnormal results of thyroid function studies: Secondary | ICD-10-CM | POA: Diagnosis not present

## 2018-04-23 DIAGNOSIS — Z89611 Acquired absence of right leg above knee: Secondary | ICD-10-CM | POA: Diagnosis not present

## 2018-04-23 DIAGNOSIS — Z86711 Personal history of pulmonary embolism: Secondary | ICD-10-CM | POA: Diagnosis not present

## 2018-04-23 DIAGNOSIS — Z79890 Hormone replacement therapy (postmenopausal): Secondary | ICD-10-CM | POA: Diagnosis not present

## 2018-04-23 DIAGNOSIS — Z79899 Other long term (current) drug therapy: Secondary | ICD-10-CM | POA: Diagnosis not present

## 2018-04-23 DIAGNOSIS — I10 Essential (primary) hypertension: Secondary | ICD-10-CM | POA: Diagnosis not present

## 2018-04-23 DIAGNOSIS — F17290 Nicotine dependence, other tobacco product, uncomplicated: Secondary | ICD-10-CM | POA: Diagnosis not present

## 2018-04-23 DIAGNOSIS — E890 Postprocedural hypothyroidism: Secondary | ICD-10-CM | POA: Diagnosis not present

## 2018-04-23 DIAGNOSIS — I872 Venous insufficiency (chronic) (peripheral): Secondary | ICD-10-CM | POA: Diagnosis not present

## 2018-04-23 DIAGNOSIS — Z9884 Bariatric surgery status: Secondary | ICD-10-CM | POA: Diagnosis not present

## 2018-04-28 DIAGNOSIS — M79605 Pain in left leg: Secondary | ICD-10-CM | POA: Diagnosis not present

## 2018-04-28 DIAGNOSIS — S78111A Complete traumatic amputation at level between right hip and knee, initial encounter: Secondary | ICD-10-CM | POA: Diagnosis not present

## 2018-05-04 DIAGNOSIS — I10 Essential (primary) hypertension: Secondary | ICD-10-CM | POA: Diagnosis not present

## 2018-05-04 DIAGNOSIS — F411 Generalized anxiety disorder: Secondary | ICD-10-CM | POA: Diagnosis not present

## 2018-05-04 DIAGNOSIS — I2693 Single subsegmental pulmonary embolism without acute cor pulmonale: Secondary | ICD-10-CM | POA: Diagnosis not present

## 2018-05-04 DIAGNOSIS — G546 Phantom limb syndrome with pain: Secondary | ICD-10-CM | POA: Diagnosis not present

## 2018-05-04 DIAGNOSIS — K219 Gastro-esophageal reflux disease without esophagitis: Secondary | ICD-10-CM | POA: Diagnosis not present

## 2018-05-12 ENCOUNTER — Other Ambulatory Visit: Payer: Self-pay

## 2018-05-12 NOTE — Patient Outreach (Signed)
[place] [facility]) Care Management  05/12/2018  Mallory Ayers Jun 06, 1970 [id number]  Transition of care  Referral date: 05/04/18 Referral source: discharged from an inpatient admission from [facility] on 04/27/18 Insurance: Health team advantage Attempt #1   Telephone call to patient regarding referral. Unable to reach patient. HIPAA compliant voice message left with call back phone number.   PLAN: RNCM will attempt 2nd telephone call to patient within 4 business days. RNCM will send outreach letter.   [name] RN,BSN, [place] Telephonic  [phone]

## 2018-05-13 ENCOUNTER — Ambulatory Visit: Payer: Self-pay

## 2018-05-14 ENCOUNTER — Other Ambulatory Visit: Payer: Self-pay

## 2018-05-14 DIAGNOSIS — K219 Gastro-esophageal reflux disease without esophagitis: Secondary | ICD-10-CM | POA: Diagnosis not present

## 2018-05-14 DIAGNOSIS — Z5181 Encounter for therapeutic drug level monitoring: Secondary | ICD-10-CM | POA: Diagnosis not present

## 2018-05-14 DIAGNOSIS — F411 Generalized anxiety disorder: Secondary | ICD-10-CM | POA: Diagnosis not present

## 2018-05-14 DIAGNOSIS — I10 Essential (primary) hypertension: Secondary | ICD-10-CM | POA: Diagnosis not present

## 2018-05-14 DIAGNOSIS — G546 Phantom limb syndrome with pain: Secondary | ICD-10-CM | POA: Diagnosis not present

## 2018-05-14 NOTE — Patient Outreach (Addendum)
[place] [facility]) Care Management  05/14/2018  Mallory Ayers Jan 14, 1971 [id number]  Transition of care  Referral date: 05/04/18 Referral source: discharged from an inpatient admission from [facility] on 04/27/18 Insurance: Health team advantage Attempt #2  Telephone call to patient regarding transition of care follow up. Unable to reach patient. HIPAA compliant voice mail message left with call back phone number   PLAN: RNCM will attempt 3rd telephone call to patient within 4 business days.   [name] RN,BSN,CCM [facility] Telephonic  [phone]

## 2018-05-15 ENCOUNTER — Other Ambulatory Visit: Payer: Self-pay

## 2018-05-15 NOTE — Patient Outreach (Signed)
[place] [facility]) Care Management  05/15/2018  Mallory Ayers 07-01-1970 [id number]  Transition of care  Referral date:05/04/18 Referral source:discharged from an inpatient admission from [facility] on 04/27/18 Insurance:Health team advantage Attempt 3  Telephone call to patient regarding transition of care referral. Unable to reach patient. HIPAA compliant voice message left with call back phone.   PLAN; If no return call will proceed with closure.   [name] RN,BSN,CCM [facility] Telephonic  [phone]

## 2018-05-25 ENCOUNTER — Other Ambulatory Visit: Payer: Self-pay

## 2018-05-25 NOTE — Patient Outreach (Signed)
[place] [facility]) Care Management  05/25/2018  Mallory Ayers 08-11-1970 [id number]  Case closure:  Transition of care  Referral date:05/04/18 Referral source:discharged from an inpatient admission from [facility] on 04/27/18 Insurance:Health team advantage  No response after 3 telephone calls and outreach letter attempt.  PLAN: RNCM will close patient due to being unable to reach.  RNCM will send closure notification to patient's primary MD   [name] RN,BSN,CCM [facility] Telephonic  [phone]

## 2018-06-12 DIAGNOSIS — K219 Gastro-esophageal reflux disease without esophagitis: Secondary | ICD-10-CM | POA: Diagnosis not present

## 2018-06-12 DIAGNOSIS — Z5181 Encounter for therapeutic drug level monitoring: Secondary | ICD-10-CM | POA: Diagnosis not present

## 2018-06-12 DIAGNOSIS — F411 Generalized anxiety disorder: Secondary | ICD-10-CM | POA: Diagnosis not present

## 2018-06-12 DIAGNOSIS — G546 Phantom limb syndrome with pain: Secondary | ICD-10-CM | POA: Diagnosis not present

## 2018-06-12 DIAGNOSIS — I10 Essential (primary) hypertension: Secondary | ICD-10-CM | POA: Diagnosis not present

## 2018-07-13 DIAGNOSIS — K219 Gastro-esophageal reflux disease without esophagitis: Secondary | ICD-10-CM | POA: Diagnosis not present

## 2018-07-13 DIAGNOSIS — G546 Phantom limb syndrome with pain: Secondary | ICD-10-CM | POA: Diagnosis not present

## 2018-07-13 DIAGNOSIS — I10 Essential (primary) hypertension: Secondary | ICD-10-CM | POA: Diagnosis not present

## 2018-07-13 DIAGNOSIS — F411 Generalized anxiety disorder: Secondary | ICD-10-CM | POA: Diagnosis not present

## 2018-07-26 DIAGNOSIS — R110 Nausea: Secondary | ICD-10-CM | POA: Diagnosis not present

## 2018-07-26 DIAGNOSIS — M79642 Pain in left hand: Secondary | ICD-10-CM | POA: Diagnosis not present

## 2018-07-26 DIAGNOSIS — M109 Gout, unspecified: Secondary | ICD-10-CM | POA: Diagnosis not present

## 2018-07-26 DIAGNOSIS — R112 Nausea with vomiting, unspecified: Secondary | ICD-10-CM | POA: Diagnosis not present

## 2018-08-12 DIAGNOSIS — K219 Gastro-esophageal reflux disease without esophagitis: Secondary | ICD-10-CM | POA: Diagnosis not present

## 2018-08-12 DIAGNOSIS — F411 Generalized anxiety disorder: Secondary | ICD-10-CM | POA: Diagnosis not present

## 2018-08-12 DIAGNOSIS — G546 Phantom limb syndrome with pain: Secondary | ICD-10-CM | POA: Diagnosis not present

## 2018-08-12 DIAGNOSIS — I10 Essential (primary) hypertension: Secondary | ICD-10-CM | POA: Diagnosis not present

## 2018-09-10 DIAGNOSIS — F411 Generalized anxiety disorder: Secondary | ICD-10-CM | POA: Diagnosis not present

## 2018-09-10 DIAGNOSIS — G546 Phantom limb syndrome with pain: Secondary | ICD-10-CM | POA: Diagnosis not present

## 2018-09-10 DIAGNOSIS — I10 Essential (primary) hypertension: Secondary | ICD-10-CM | POA: Diagnosis not present

## 2018-09-10 DIAGNOSIS — K219 Gastro-esophageal reflux disease without esophagitis: Secondary | ICD-10-CM | POA: Diagnosis not present

## 2018-09-15 DIAGNOSIS — B029 Zoster without complications: Secondary | ICD-10-CM | POA: Diagnosis not present

## 2018-10-12 DIAGNOSIS — I10 Essential (primary) hypertension: Secondary | ICD-10-CM | POA: Diagnosis not present

## 2018-10-12 DIAGNOSIS — K219 Gastro-esophageal reflux disease without esophagitis: Secondary | ICD-10-CM | POA: Diagnosis not present

## 2018-10-12 DIAGNOSIS — F411 Generalized anxiety disorder: Secondary | ICD-10-CM | POA: Diagnosis not present

## 2018-10-12 DIAGNOSIS — G546 Phantom limb syndrome with pain: Secondary | ICD-10-CM | POA: Diagnosis not present

## 2018-10-16 DIAGNOSIS — R1084 Generalized abdominal pain: Secondary | ICD-10-CM | POA: Diagnosis not present

## 2018-10-20 DIAGNOSIS — I10 Essential (primary) hypertension: Secondary | ICD-10-CM | POA: Diagnosis not present

## 2018-10-20 DIAGNOSIS — K8010 Calculus of gallbladder with chronic cholecystitis without obstruction: Secondary | ICD-10-CM | POA: Diagnosis not present

## 2018-10-20 DIAGNOSIS — R1011 Right upper quadrant pain: Secondary | ICD-10-CM | POA: Diagnosis not present

## 2018-10-20 DIAGNOSIS — K219 Gastro-esophageal reflux disease without esophagitis: Secondary | ICD-10-CM | POA: Diagnosis not present

## 2018-10-23 DIAGNOSIS — R799 Abnormal finding of blood chemistry, unspecified: Secondary | ICD-10-CM | POA: Diagnosis not present

## 2018-10-23 DIAGNOSIS — Z01812 Encounter for preprocedural laboratory examination: Secondary | ICD-10-CM | POA: Diagnosis not present

## 2018-10-26 DIAGNOSIS — K8020 Calculus of gallbladder without cholecystitis without obstruction: Secondary | ICD-10-CM | POA: Diagnosis not present

## 2018-10-26 DIAGNOSIS — K8018 Calculus of gallbladder with other cholecystitis without obstruction: Secondary | ICD-10-CM | POA: Diagnosis not present

## 2018-10-26 DIAGNOSIS — K8011 Calculus of gallbladder with chronic cholecystitis with obstruction: Secondary | ICD-10-CM | POA: Diagnosis not present

## 2018-10-26 DIAGNOSIS — K8010 Calculus of gallbladder with chronic cholecystitis without obstruction: Secondary | ICD-10-CM | POA: Diagnosis not present

## 2018-10-26 DIAGNOSIS — Z1159 Encounter for screening for other viral diseases: Secondary | ICD-10-CM | POA: Diagnosis not present

## 2018-11-11 DIAGNOSIS — G546 Phantom limb syndrome with pain: Secondary | ICD-10-CM | POA: Diagnosis not present

## 2018-11-11 DIAGNOSIS — K219 Gastro-esophageal reflux disease without esophagitis: Secondary | ICD-10-CM | POA: Diagnosis not present

## 2018-11-11 DIAGNOSIS — F411 Generalized anxiety disorder: Secondary | ICD-10-CM | POA: Diagnosis not present

## 2018-11-11 DIAGNOSIS — I10 Essential (primary) hypertension: Secondary | ICD-10-CM | POA: Diagnosis not present

## 2018-11-14 DIAGNOSIS — Z89612 Acquired absence of left leg above knee: Secondary | ICD-10-CM | POA: Diagnosis not present

## 2018-11-14 DIAGNOSIS — Z89611 Acquired absence of right leg above knee: Secondary | ICD-10-CM | POA: Diagnosis not present

## 2018-11-14 DIAGNOSIS — G546 Phantom limb syndrome with pain: Secondary | ICD-10-CM | POA: Diagnosis not present

## 2018-11-26 DIAGNOSIS — R1032 Left lower quadrant pain: Secondary | ICD-10-CM | POA: Diagnosis not present

## 2018-12-11 DIAGNOSIS — Z5181 Encounter for therapeutic drug level monitoring: Secondary | ICD-10-CM | POA: Diagnosis not present

## 2018-12-11 DIAGNOSIS — K219 Gastro-esophageal reflux disease without esophagitis: Secondary | ICD-10-CM | POA: Diagnosis not present

## 2018-12-11 DIAGNOSIS — G546 Phantom limb syndrome with pain: Secondary | ICD-10-CM | POA: Diagnosis not present

## 2018-12-11 DIAGNOSIS — F411 Generalized anxiety disorder: Secondary | ICD-10-CM | POA: Diagnosis not present

## 2018-12-11 DIAGNOSIS — I10 Essential (primary) hypertension: Secondary | ICD-10-CM | POA: Diagnosis not present

## 2018-12-15 DIAGNOSIS — Z89611 Acquired absence of right leg above knee: Secondary | ICD-10-CM | POA: Diagnosis not present

## 2018-12-15 DIAGNOSIS — G546 Phantom limb syndrome with pain: Secondary | ICD-10-CM | POA: Diagnosis not present

## 2018-12-15 DIAGNOSIS — Z89612 Acquired absence of left leg above knee: Secondary | ICD-10-CM | POA: Diagnosis not present

## 2019-01-08 DIAGNOSIS — D518 Other vitamin B12 deficiency anemias: Secondary | ICD-10-CM | POA: Diagnosis not present

## 2019-01-08 DIAGNOSIS — E782 Mixed hyperlipidemia: Secondary | ICD-10-CM | POA: Diagnosis not present

## 2019-01-08 DIAGNOSIS — I10 Essential (primary) hypertension: Secondary | ICD-10-CM | POA: Diagnosis not present

## 2019-01-08 DIAGNOSIS — E119 Type 2 diabetes mellitus without complications: Secondary | ICD-10-CM | POA: Diagnosis not present

## 2019-01-08 DIAGNOSIS — E559 Vitamin D deficiency, unspecified: Secondary | ICD-10-CM | POA: Diagnosis not present

## 2019-01-08 DIAGNOSIS — Z5181 Encounter for therapeutic drug level monitoring: Secondary | ICD-10-CM | POA: Diagnosis not present

## 2019-01-08 DIAGNOSIS — E038 Other specified hypothyroidism: Secondary | ICD-10-CM | POA: Diagnosis not present

## 2019-01-08 DIAGNOSIS — K219 Gastro-esophageal reflux disease without esophagitis: Secondary | ICD-10-CM | POA: Diagnosis not present

## 2019-01-08 DIAGNOSIS — G546 Phantom limb syndrome with pain: Secondary | ICD-10-CM | POA: Diagnosis not present

## 2019-01-08 DIAGNOSIS — F411 Generalized anxiety disorder: Secondary | ICD-10-CM | POA: Diagnosis not present

## 2019-01-14 DIAGNOSIS — G546 Phantom limb syndrome with pain: Secondary | ICD-10-CM | POA: Diagnosis not present

## 2019-01-14 DIAGNOSIS — Z89611 Acquired absence of right leg above knee: Secondary | ICD-10-CM | POA: Diagnosis not present

## 2019-01-14 DIAGNOSIS — Z89612 Acquired absence of left leg above knee: Secondary | ICD-10-CM | POA: Diagnosis not present

## 2019-02-05 DIAGNOSIS — F329 Major depressive disorder, single episode, unspecified: Secondary | ICD-10-CM | POA: Diagnosis not present

## 2019-02-05 DIAGNOSIS — I10 Essential (primary) hypertension: Secondary | ICD-10-CM | POA: Diagnosis not present

## 2019-02-05 DIAGNOSIS — G894 Chronic pain syndrome: Secondary | ICD-10-CM | POA: Diagnosis not present

## 2019-02-05 DIAGNOSIS — F411 Generalized anxiety disorder: Secondary | ICD-10-CM | POA: Diagnosis not present

## 2019-02-05 DIAGNOSIS — K219 Gastro-esophageal reflux disease without esophagitis: Secondary | ICD-10-CM | POA: Diagnosis not present

## 2019-02-14 DIAGNOSIS — Z89611 Acquired absence of right leg above knee: Secondary | ICD-10-CM | POA: Diagnosis not present

## 2019-02-14 DIAGNOSIS — Z89612 Acquired absence of left leg above knee: Secondary | ICD-10-CM | POA: Diagnosis not present

## 2019-02-14 DIAGNOSIS — G546 Phantom limb syndrome with pain: Secondary | ICD-10-CM | POA: Diagnosis not present

## 2019-03-03 DIAGNOSIS — G546 Phantom limb syndrome with pain: Secondary | ICD-10-CM | POA: Diagnosis not present

## 2019-03-03 DIAGNOSIS — R0789 Other chest pain: Secondary | ICD-10-CM | POA: Diagnosis not present

## 2019-03-03 DIAGNOSIS — R0600 Dyspnea, unspecified: Secondary | ICD-10-CM | POA: Diagnosis not present

## 2019-03-03 DIAGNOSIS — I10 Essential (primary) hypertension: Secondary | ICD-10-CM | POA: Diagnosis not present

## 2019-03-03 DIAGNOSIS — R079 Chest pain, unspecified: Secondary | ICD-10-CM | POA: Diagnosis not present

## 2019-03-03 DIAGNOSIS — I2699 Other pulmonary embolism without acute cor pulmonale: Secondary | ICD-10-CM | POA: Diagnosis not present

## 2019-03-03 DIAGNOSIS — F411 Generalized anxiety disorder: Secondary | ICD-10-CM | POA: Diagnosis not present

## 2019-03-03 DIAGNOSIS — Z79899 Other long term (current) drug therapy: Secondary | ICD-10-CM | POA: Diagnosis not present

## 2019-03-03 DIAGNOSIS — Z20828 Contact with and (suspected) exposure to other viral communicable diseases: Secondary | ICD-10-CM | POA: Diagnosis not present

## 2019-03-03 DIAGNOSIS — I2782 Chronic pulmonary embolism: Secondary | ICD-10-CM | POA: Diagnosis not present

## 2019-03-03 DIAGNOSIS — K219 Gastro-esophageal reflux disease without esophagitis: Secondary | ICD-10-CM | POA: Diagnosis not present

## 2019-03-03 DIAGNOSIS — Z7901 Long term (current) use of anticoagulants: Secondary | ICD-10-CM | POA: Diagnosis not present

## 2019-03-03 DIAGNOSIS — Z5181 Encounter for therapeutic drug level monitoring: Secondary | ICD-10-CM | POA: Diagnosis not present

## 2019-03-03 DIAGNOSIS — Z87891 Personal history of nicotine dependence: Secondary | ICD-10-CM | POA: Diagnosis not present

## 2019-03-16 DIAGNOSIS — Z89611 Acquired absence of right leg above knee: Secondary | ICD-10-CM | POA: Diagnosis not present

## 2019-03-16 DIAGNOSIS — Z89612 Acquired absence of left leg above knee: Secondary | ICD-10-CM | POA: Diagnosis not present

## 2019-03-16 DIAGNOSIS — G546 Phantom limb syndrome with pain: Secondary | ICD-10-CM | POA: Diagnosis not present

## 2019-04-05 DIAGNOSIS — G546 Phantom limb syndrome with pain: Secondary | ICD-10-CM | POA: Diagnosis not present

## 2019-04-05 DIAGNOSIS — Z5181 Encounter for therapeutic drug level monitoring: Secondary | ICD-10-CM | POA: Diagnosis not present

## 2019-04-05 DIAGNOSIS — I10 Essential (primary) hypertension: Secondary | ICD-10-CM | POA: Diagnosis not present

## 2019-04-05 DIAGNOSIS — K219 Gastro-esophageal reflux disease without esophagitis: Secondary | ICD-10-CM | POA: Diagnosis not present

## 2019-04-16 DIAGNOSIS — Z89611 Acquired absence of right leg above knee: Secondary | ICD-10-CM | POA: Diagnosis not present

## 2019-04-16 DIAGNOSIS — G546 Phantom limb syndrome with pain: Secondary | ICD-10-CM | POA: Diagnosis not present

## 2019-04-16 DIAGNOSIS — Z89612 Acquired absence of left leg above knee: Secondary | ICD-10-CM | POA: Diagnosis not present

## 2019-05-05 DIAGNOSIS — D518 Other vitamin B12 deficiency anemias: Secondary | ICD-10-CM | POA: Diagnosis not present

## 2019-05-05 DIAGNOSIS — I10 Essential (primary) hypertension: Secondary | ICD-10-CM | POA: Diagnosis not present

## 2019-05-05 DIAGNOSIS — E119 Type 2 diabetes mellitus without complications: Secondary | ICD-10-CM | POA: Diagnosis not present

## 2019-05-05 DIAGNOSIS — F411 Generalized anxiety disorder: Secondary | ICD-10-CM | POA: Diagnosis not present

## 2019-05-05 DIAGNOSIS — E559 Vitamin D deficiency, unspecified: Secondary | ICD-10-CM | POA: Diagnosis not present

## 2019-05-05 DIAGNOSIS — G546 Phantom limb syndrome with pain: Secondary | ICD-10-CM | POA: Diagnosis not present

## 2019-05-05 DIAGNOSIS — K219 Gastro-esophageal reflux disease without esophagitis: Secondary | ICD-10-CM | POA: Diagnosis not present

## 2019-05-05 DIAGNOSIS — Z5181 Encounter for therapeutic drug level monitoring: Secondary | ICD-10-CM | POA: Diagnosis not present

## 2019-05-05 DIAGNOSIS — E038 Other specified hypothyroidism: Secondary | ICD-10-CM | POA: Diagnosis not present

## 2019-05-05 DIAGNOSIS — Z0000 Encounter for general adult medical examination without abnormal findings: Secondary | ICD-10-CM | POA: Diagnosis not present

## 2019-05-05 DIAGNOSIS — E782 Mixed hyperlipidemia: Secondary | ICD-10-CM | POA: Diagnosis not present

## 2019-05-07 DIAGNOSIS — Z7901 Long term (current) use of anticoagulants: Secondary | ICD-10-CM | POA: Diagnosis not present

## 2019-05-07 DIAGNOSIS — S0083XA Contusion of other part of head, initial encounter: Secondary | ICD-10-CM | POA: Diagnosis not present

## 2019-05-07 DIAGNOSIS — Z89611 Acquired absence of right leg above knee: Secondary | ICD-10-CM | POA: Diagnosis not present

## 2019-05-07 DIAGNOSIS — Z8679 Personal history of other diseases of the circulatory system: Secondary | ICD-10-CM | POA: Diagnosis not present

## 2019-05-07 DIAGNOSIS — R519 Headache, unspecified: Secondary | ICD-10-CM | POA: Diagnosis not present

## 2019-05-07 DIAGNOSIS — S0990XA Unspecified injury of head, initial encounter: Secondary | ICD-10-CM | POA: Diagnosis not present

## 2019-05-07 DIAGNOSIS — Z86711 Personal history of pulmonary embolism: Secondary | ICD-10-CM | POA: Diagnosis not present

## 2019-05-07 DIAGNOSIS — Z89612 Acquired absence of left leg above knee: Secondary | ICD-10-CM | POA: Diagnosis not present

## 2019-05-07 DIAGNOSIS — E039 Hypothyroidism, unspecified: Secondary | ICD-10-CM | POA: Diagnosis not present

## 2019-05-13 ENCOUNTER — Telehealth (HOSPITAL_BASED_OUTPATIENT_CLINIC_OR_DEPARTMENT_OTHER): Payer: Self-pay | Admitting: Otolaryngology

## 2019-05-13 NOTE — Telephone Encounter (Signed)
RETURN CALL: Voicemail - Detailed Message      SUBJECT:  Appointment Request     REASON FOR VISIT: New patient: The patient was last seen by Dr. Renaee Munda in 2016. The patient has had Stage 1 surgery with Dr. Alben Spittle and would like to discuss Stage 2 surgery.    PREFERRED DATE/TIME: As soon as possible.    ADDITIONAL INFORMATION: Please contact the patient as soon as possible. Thanks!

## 2019-05-17 DIAGNOSIS — G546 Phantom limb syndrome with pain: Secondary | ICD-10-CM | POA: Diagnosis not present

## 2019-05-17 DIAGNOSIS — Z89611 Acquired absence of right leg above knee: Secondary | ICD-10-CM | POA: Diagnosis not present

## 2019-05-17 DIAGNOSIS — Z89612 Acquired absence of left leg above knee: Secondary | ICD-10-CM | POA: Diagnosis not present

## 2019-05-17 NOTE — Telephone Encounter (Signed)
I have left a message for patient to call our clinic back about her appointment request.

## 2019-05-18 NOTE — Telephone Encounter (Signed)
I left a message for Jacqueline Terrell to call our clinic back about her appointment request.

## 2019-06-04 DIAGNOSIS — G546 Phantom limb syndrome with pain: Secondary | ICD-10-CM | POA: Diagnosis not present

## 2019-06-04 DIAGNOSIS — K921 Melena: Secondary | ICD-10-CM | POA: Diagnosis not present

## 2019-06-04 DIAGNOSIS — I10 Essential (primary) hypertension: Secondary | ICD-10-CM | POA: Diagnosis not present

## 2019-06-04 DIAGNOSIS — K219 Gastro-esophageal reflux disease without esophagitis: Secondary | ICD-10-CM | POA: Diagnosis not present

## 2019-06-04 DIAGNOSIS — F411 Generalized anxiety disorder: Secondary | ICD-10-CM | POA: Diagnosis not present

## 2019-06-14 DIAGNOSIS — Z89612 Acquired absence of left leg above knee: Secondary | ICD-10-CM | POA: Diagnosis not present

## 2019-06-14 DIAGNOSIS — Z89611 Acquired absence of right leg above knee: Secondary | ICD-10-CM | POA: Diagnosis not present

## 2019-06-14 DIAGNOSIS — G546 Phantom limb syndrome with pain: Secondary | ICD-10-CM | POA: Diagnosis not present

## 2019-07-02 DIAGNOSIS — I10 Essential (primary) hypertension: Secondary | ICD-10-CM | POA: Diagnosis not present

## 2019-07-02 DIAGNOSIS — K219 Gastro-esophageal reflux disease without esophagitis: Secondary | ICD-10-CM | POA: Diagnosis not present

## 2019-07-02 DIAGNOSIS — G546 Phantom limb syndrome with pain: Secondary | ICD-10-CM | POA: Diagnosis not present

## 2019-07-02 DIAGNOSIS — Z5181 Encounter for therapeutic drug level monitoring: Secondary | ICD-10-CM | POA: Diagnosis not present

## 2019-07-15 DIAGNOSIS — Z89611 Acquired absence of right leg above knee: Secondary | ICD-10-CM | POA: Diagnosis not present

## 2019-07-15 DIAGNOSIS — Z89612 Acquired absence of left leg above knee: Secondary | ICD-10-CM | POA: Diagnosis not present

## 2019-07-15 DIAGNOSIS — G546 Phantom limb syndrome with pain: Secondary | ICD-10-CM | POA: Diagnosis not present

## 2019-07-30 DIAGNOSIS — K219 Gastro-esophageal reflux disease without esophagitis: Secondary | ICD-10-CM | POA: Diagnosis not present

## 2019-07-30 DIAGNOSIS — I10 Essential (primary) hypertension: Secondary | ICD-10-CM | POA: Diagnosis not present

## 2019-07-30 DIAGNOSIS — F411 Generalized anxiety disorder: Secondary | ICD-10-CM | POA: Diagnosis not present

## 2019-07-30 DIAGNOSIS — G546 Phantom limb syndrome with pain: Secondary | ICD-10-CM | POA: Diagnosis not present

## 2019-08-14 DIAGNOSIS — F332 Major depressive disorder, recurrent severe without psychotic features: Secondary | ICD-10-CM | POA: Diagnosis not present

## 2019-08-14 DIAGNOSIS — Z79899 Other long term (current) drug therapy: Secondary | ICD-10-CM | POA: Diagnosis not present

## 2019-08-14 DIAGNOSIS — F10129 Alcohol abuse with intoxication, unspecified: Secondary | ICD-10-CM | POA: Diagnosis not present

## 2019-08-14 DIAGNOSIS — F10920 Alcohol use, unspecified with intoxication, uncomplicated: Secondary | ICD-10-CM | POA: Diagnosis not present

## 2019-08-14 DIAGNOSIS — Z87891 Personal history of nicotine dependence: Secondary | ICD-10-CM | POA: Diagnosis not present

## 2019-08-14 DIAGNOSIS — Z89611 Acquired absence of right leg above knee: Secondary | ICD-10-CM | POA: Diagnosis not present

## 2019-08-14 DIAGNOSIS — G546 Phantom limb syndrome with pain: Secondary | ICD-10-CM | POA: Diagnosis not present

## 2019-08-14 DIAGNOSIS — Z89612 Acquired absence of left leg above knee: Secondary | ICD-10-CM | POA: Diagnosis not present

## 2019-08-18 DIAGNOSIS — F410 Panic disorder [episodic paroxysmal anxiety] without agoraphobia: Secondary | ICD-10-CM | POA: Diagnosis not present

## 2019-08-18 DIAGNOSIS — F17200 Nicotine dependence, unspecified, uncomplicated: Secondary | ICD-10-CM | POA: Diagnosis not present

## 2019-08-18 DIAGNOSIS — F1120 Opioid dependence, uncomplicated: Secondary | ICD-10-CM | POA: Diagnosis not present

## 2019-08-18 DIAGNOSIS — F419 Anxiety disorder, unspecified: Secondary | ICD-10-CM | POA: Diagnosis not present

## 2019-08-18 DIAGNOSIS — F4329 Adjustment disorder with other symptoms: Secondary | ICD-10-CM | POA: Diagnosis not present

## 2019-08-18 DIAGNOSIS — F1020 Alcohol dependence, uncomplicated: Secondary | ICD-10-CM | POA: Diagnosis not present

## 2019-08-30 DIAGNOSIS — Z5181 Encounter for therapeutic drug level monitoring: Secondary | ICD-10-CM | POA: Diagnosis not present

## 2019-08-30 DIAGNOSIS — F3341 Major depressive disorder, recurrent, in partial remission: Secondary | ICD-10-CM | POA: Diagnosis not present

## 2019-08-30 DIAGNOSIS — H6123 Impacted cerumen, bilateral: Secondary | ICD-10-CM | POA: Diagnosis not present

## 2019-08-30 DIAGNOSIS — G546 Phantom limb syndrome with pain: Secondary | ICD-10-CM | POA: Diagnosis not present

## 2019-09-03 DIAGNOSIS — E038 Other specified hypothyroidism: Secondary | ICD-10-CM | POA: Diagnosis not present

## 2019-09-03 DIAGNOSIS — F329 Major depressive disorder, single episode, unspecified: Secondary | ICD-10-CM | POA: Diagnosis not present

## 2019-09-03 DIAGNOSIS — D518 Other vitamin B12 deficiency anemias: Secondary | ICD-10-CM | POA: Diagnosis not present

## 2019-09-03 DIAGNOSIS — R21 Rash and other nonspecific skin eruption: Secondary | ICD-10-CM | POA: Diagnosis not present

## 2019-09-03 DIAGNOSIS — I10 Essential (primary) hypertension: Secondary | ICD-10-CM | POA: Diagnosis not present

## 2019-09-14 DIAGNOSIS — Z89612 Acquired absence of left leg above knee: Secondary | ICD-10-CM | POA: Diagnosis not present

## 2019-09-14 DIAGNOSIS — G546 Phantom limb syndrome with pain: Secondary | ICD-10-CM | POA: Diagnosis not present

## 2019-09-14 DIAGNOSIS — Z89611 Acquired absence of right leg above knee: Secondary | ICD-10-CM | POA: Diagnosis not present

## 2019-09-30 DIAGNOSIS — Z5181 Encounter for therapeutic drug level monitoring: Secondary | ICD-10-CM | POA: Diagnosis not present

## 2019-09-30 DIAGNOSIS — G546 Phantom limb syndrome with pain: Secondary | ICD-10-CM | POA: Diagnosis not present

## 2019-10-04 DIAGNOSIS — D518 Other vitamin B12 deficiency anemias: Secondary | ICD-10-CM | POA: Diagnosis not present

## 2019-10-04 DIAGNOSIS — J449 Chronic obstructive pulmonary disease, unspecified: Secondary | ICD-10-CM | POA: Diagnosis not present

## 2019-10-04 DIAGNOSIS — D511 Vitamin B12 deficiency anemia due to selective vitamin B12 malabsorption with proteinuria: Secondary | ICD-10-CM | POA: Diagnosis not present

## 2019-10-04 DIAGNOSIS — E038 Other specified hypothyroidism: Secondary | ICD-10-CM | POA: Diagnosis not present

## 2019-10-04 DIAGNOSIS — E782 Mixed hyperlipidemia: Secondary | ICD-10-CM | POA: Diagnosis not present

## 2019-10-04 DIAGNOSIS — I10 Essential (primary) hypertension: Secondary | ICD-10-CM | POA: Diagnosis not present

## 2019-10-04 DIAGNOSIS — E559 Vitamin D deficiency, unspecified: Secondary | ICD-10-CM | POA: Diagnosis not present

## 2019-10-04 DIAGNOSIS — F3341 Major depressive disorder, recurrent, in partial remission: Secondary | ICD-10-CM | POA: Diagnosis not present

## 2019-10-04 DIAGNOSIS — F329 Major depressive disorder, single episode, unspecified: Secondary | ICD-10-CM | POA: Diagnosis not present

## 2019-10-14 DIAGNOSIS — Z89611 Acquired absence of right leg above knee: Secondary | ICD-10-CM | POA: Diagnosis not present

## 2019-10-14 DIAGNOSIS — G546 Phantom limb syndrome with pain: Secondary | ICD-10-CM | POA: Diagnosis not present

## 2019-10-14 DIAGNOSIS — Z89612 Acquired absence of left leg above knee: Secondary | ICD-10-CM | POA: Diagnosis not present

## 2019-10-15 DIAGNOSIS — D518 Other vitamin B12 deficiency anemias: Secondary | ICD-10-CM | POA: Diagnosis not present

## 2019-10-15 DIAGNOSIS — F3341 Major depressive disorder, recurrent, in partial remission: Secondary | ICD-10-CM | POA: Diagnosis not present

## 2019-10-15 DIAGNOSIS — E559 Vitamin D deficiency, unspecified: Secondary | ICD-10-CM | POA: Diagnosis not present

## 2019-10-15 DIAGNOSIS — I10 Essential (primary) hypertension: Secondary | ICD-10-CM | POA: Diagnosis not present

## 2019-10-15 DIAGNOSIS — F329 Major depressive disorder, single episode, unspecified: Secondary | ICD-10-CM | POA: Diagnosis not present

## 2019-10-15 DIAGNOSIS — E038 Other specified hypothyroidism: Secondary | ICD-10-CM | POA: Diagnosis not present

## 2019-10-15 DIAGNOSIS — E782 Mixed hyperlipidemia: Secondary | ICD-10-CM | POA: Diagnosis not present

## 2019-10-15 DIAGNOSIS — J449 Chronic obstructive pulmonary disease, unspecified: Secondary | ICD-10-CM | POA: Diagnosis not present

## 2019-10-15 DIAGNOSIS — D511 Vitamin B12 deficiency anemia due to selective vitamin B12 malabsorption with proteinuria: Secondary | ICD-10-CM | POA: Diagnosis not present

## 2019-10-20 DIAGNOSIS — F17200 Nicotine dependence, unspecified, uncomplicated: Secondary | ICD-10-CM | POA: Diagnosis not present

## 2019-10-20 DIAGNOSIS — F10239 Alcohol dependence with withdrawal, unspecified: Secondary | ICD-10-CM | POA: Diagnosis not present

## 2019-10-20 DIAGNOSIS — F1020 Alcohol dependence, uncomplicated: Secondary | ICD-10-CM | POA: Diagnosis not present

## 2019-10-20 DIAGNOSIS — F1210 Cannabis abuse, uncomplicated: Secondary | ICD-10-CM | POA: Diagnosis not present

## 2019-11-02 DIAGNOSIS — Z3041 Encounter for surveillance of contraceptive pills: Secondary | ICD-10-CM | POA: Diagnosis not present

## 2019-11-02 DIAGNOSIS — F3341 Major depressive disorder, recurrent, in partial remission: Secondary | ICD-10-CM | POA: Diagnosis not present

## 2019-11-02 DIAGNOSIS — Z5181 Encounter for therapeutic drug level monitoring: Secondary | ICD-10-CM | POA: Diagnosis not present

## 2019-11-02 DIAGNOSIS — G546 Phantom limb syndrome with pain: Secondary | ICD-10-CM | POA: Diagnosis not present

## 2019-11-14 DIAGNOSIS — Z89611 Acquired absence of right leg above knee: Secondary | ICD-10-CM | POA: Diagnosis not present

## 2019-11-14 DIAGNOSIS — G546 Phantom limb syndrome with pain: Secondary | ICD-10-CM | POA: Diagnosis not present

## 2019-11-14 DIAGNOSIS — Z89612 Acquired absence of left leg above knee: Secondary | ICD-10-CM | POA: Diagnosis not present

## 2019-11-30 DIAGNOSIS — Z5181 Encounter for therapeutic drug level monitoring: Secondary | ICD-10-CM | POA: Diagnosis not present

## 2019-11-30 DIAGNOSIS — E038 Other specified hypothyroidism: Secondary | ICD-10-CM | POA: Diagnosis not present

## 2019-11-30 DIAGNOSIS — E782 Mixed hyperlipidemia: Secondary | ICD-10-CM | POA: Diagnosis not present

## 2019-11-30 DIAGNOSIS — I10 Essential (primary) hypertension: Secondary | ICD-10-CM | POA: Diagnosis not present

## 2019-11-30 DIAGNOSIS — Z3041 Encounter for surveillance of contraceptive pills: Secondary | ICD-10-CM | POA: Diagnosis not present

## 2019-11-30 DIAGNOSIS — F411 Generalized anxiety disorder: Secondary | ICD-10-CM | POA: Diagnosis not present

## 2019-11-30 DIAGNOSIS — G546 Phantom limb syndrome with pain: Secondary | ICD-10-CM | POA: Diagnosis not present

## 2019-11-30 DIAGNOSIS — J302 Other seasonal allergic rhinitis: Secondary | ICD-10-CM | POA: Diagnosis not present

## 2019-11-30 DIAGNOSIS — D518 Other vitamin B12 deficiency anemias: Secondary | ICD-10-CM | POA: Diagnosis not present

## 2019-11-30 DIAGNOSIS — F3341 Major depressive disorder, recurrent, in partial remission: Secondary | ICD-10-CM | POA: Diagnosis not present

## 2019-11-30 DIAGNOSIS — K219 Gastro-esophageal reflux disease without esophagitis: Secondary | ICD-10-CM | POA: Diagnosis not present

## 2019-11-30 DIAGNOSIS — F329 Major depressive disorder, single episode, unspecified: Secondary | ICD-10-CM | POA: Diagnosis not present

## 2019-12-07 DIAGNOSIS — E559 Vitamin D deficiency, unspecified: Secondary | ICD-10-CM | POA: Diagnosis not present

## 2019-12-07 DIAGNOSIS — I10 Essential (primary) hypertension: Secondary | ICD-10-CM | POA: Diagnosis not present

## 2019-12-07 DIAGNOSIS — F329 Major depressive disorder, single episode, unspecified: Secondary | ICD-10-CM | POA: Diagnosis not present

## 2019-12-07 DIAGNOSIS — E782 Mixed hyperlipidemia: Secondary | ICD-10-CM | POA: Diagnosis not present

## 2019-12-07 DIAGNOSIS — D511 Vitamin B12 deficiency anemia due to selective vitamin B12 malabsorption with proteinuria: Secondary | ICD-10-CM | POA: Diagnosis not present

## 2019-12-07 DIAGNOSIS — E038 Other specified hypothyroidism: Secondary | ICD-10-CM | POA: Diagnosis not present

## 2019-12-07 DIAGNOSIS — D518 Other vitamin B12 deficiency anemias: Secondary | ICD-10-CM | POA: Diagnosis not present

## 2019-12-07 DIAGNOSIS — J449 Chronic obstructive pulmonary disease, unspecified: Secondary | ICD-10-CM | POA: Diagnosis not present

## 2019-12-07 DIAGNOSIS — F3341 Major depressive disorder, recurrent, in partial remission: Secondary | ICD-10-CM | POA: Diagnosis not present

## 2019-12-15 DIAGNOSIS — Z89611 Acquired absence of right leg above knee: Secondary | ICD-10-CM | POA: Diagnosis not present

## 2019-12-15 DIAGNOSIS — Z89612 Acquired absence of left leg above knee: Secondary | ICD-10-CM | POA: Diagnosis not present

## 2019-12-15 DIAGNOSIS — G546 Phantom limb syndrome with pain: Secondary | ICD-10-CM | POA: Diagnosis not present

## 2019-12-28 DIAGNOSIS — Z5181 Encounter for therapeutic drug level monitoring: Secondary | ICD-10-CM | POA: Diagnosis not present

## 2019-12-28 DIAGNOSIS — G546 Phantom limb syndrome with pain: Secondary | ICD-10-CM | POA: Diagnosis not present

## 2020-01-05 DIAGNOSIS — F3341 Major depressive disorder, recurrent, in partial remission: Secondary | ICD-10-CM | POA: Diagnosis not present

## 2020-01-05 DIAGNOSIS — D518 Other vitamin B12 deficiency anemias: Secondary | ICD-10-CM | POA: Diagnosis not present

## 2020-01-05 DIAGNOSIS — I10 Essential (primary) hypertension: Secondary | ICD-10-CM | POA: Diagnosis not present

## 2020-01-05 DIAGNOSIS — J449 Chronic obstructive pulmonary disease, unspecified: Secondary | ICD-10-CM | POA: Diagnosis not present

## 2020-01-05 DIAGNOSIS — E559 Vitamin D deficiency, unspecified: Secondary | ICD-10-CM | POA: Diagnosis not present

## 2020-01-05 DIAGNOSIS — D511 Vitamin B12 deficiency anemia due to selective vitamin B12 malabsorption with proteinuria: Secondary | ICD-10-CM | POA: Diagnosis not present

## 2020-01-05 DIAGNOSIS — E038 Other specified hypothyroidism: Secondary | ICD-10-CM | POA: Diagnosis not present

## 2020-01-05 DIAGNOSIS — E782 Mixed hyperlipidemia: Secondary | ICD-10-CM | POA: Diagnosis not present

## 2020-01-05 DIAGNOSIS — F329 Major depressive disorder, single episode, unspecified: Secondary | ICD-10-CM | POA: Diagnosis not present

## 2020-01-06 DIAGNOSIS — R079 Chest pain, unspecified: Secondary | ICD-10-CM | POA: Diagnosis not present

## 2020-01-06 DIAGNOSIS — Z7902 Long term (current) use of antithrombotics/antiplatelets: Secondary | ICD-10-CM | POA: Diagnosis not present

## 2020-01-06 DIAGNOSIS — I2782 Chronic pulmonary embolism: Secondary | ICD-10-CM | POA: Diagnosis not present

## 2020-01-06 DIAGNOSIS — R0781 Pleurodynia: Secondary | ICD-10-CM | POA: Diagnosis not present

## 2020-01-06 DIAGNOSIS — J40 Bronchitis, not specified as acute or chronic: Secondary | ICD-10-CM | POA: Diagnosis not present

## 2020-01-06 DIAGNOSIS — I2699 Other pulmonary embolism without acute cor pulmonale: Secondary | ICD-10-CM | POA: Diagnosis not present

## 2020-01-14 DIAGNOSIS — G546 Phantom limb syndrome with pain: Secondary | ICD-10-CM | POA: Diagnosis not present

## 2020-01-14 DIAGNOSIS — Z89612 Acquired absence of left leg above knee: Secondary | ICD-10-CM | POA: Diagnosis not present

## 2020-01-14 DIAGNOSIS — Z89611 Acquired absence of right leg above knee: Secondary | ICD-10-CM | POA: Diagnosis not present

## 2020-01-25 DIAGNOSIS — I10 Essential (primary) hypertension: Secondary | ICD-10-CM | POA: Diagnosis not present

## 2020-01-25 DIAGNOSIS — E038 Other specified hypothyroidism: Secondary | ICD-10-CM | POA: Diagnosis not present

## 2020-01-25 DIAGNOSIS — D518 Other vitamin B12 deficiency anemias: Secondary | ICD-10-CM | POA: Diagnosis not present

## 2020-01-25 DIAGNOSIS — E559 Vitamin D deficiency, unspecified: Secondary | ICD-10-CM | POA: Diagnosis not present

## 2020-01-25 DIAGNOSIS — E782 Mixed hyperlipidemia: Secondary | ICD-10-CM | POA: Diagnosis not present

## 2020-01-25 DIAGNOSIS — D511 Vitamin B12 deficiency anemia due to selective vitamin B12 malabsorption with proteinuria: Secondary | ICD-10-CM | POA: Diagnosis not present

## 2020-01-25 DIAGNOSIS — F329 Major depressive disorder, single episode, unspecified: Secondary | ICD-10-CM | POA: Diagnosis not present

## 2020-01-25 DIAGNOSIS — F3341 Major depressive disorder, recurrent, in partial remission: Secondary | ICD-10-CM | POA: Diagnosis not present

## 2020-01-25 DIAGNOSIS — J449 Chronic obstructive pulmonary disease, unspecified: Secondary | ICD-10-CM | POA: Diagnosis not present

## 2020-01-27 DIAGNOSIS — R21 Rash and other nonspecific skin eruption: Secondary | ICD-10-CM | POA: Diagnosis not present

## 2020-01-27 DIAGNOSIS — E038 Other specified hypothyroidism: Secondary | ICD-10-CM | POA: Diagnosis not present

## 2020-01-27 DIAGNOSIS — F3341 Major depressive disorder, recurrent, in partial remission: Secondary | ICD-10-CM | POA: Diagnosis not present

## 2020-01-27 DIAGNOSIS — G546 Phantom limb syndrome with pain: Secondary | ICD-10-CM | POA: Diagnosis not present

## 2020-02-24 DIAGNOSIS — E782 Mixed hyperlipidemia: Secondary | ICD-10-CM | POA: Diagnosis not present

## 2020-02-24 DIAGNOSIS — E559 Vitamin D deficiency, unspecified: Secondary | ICD-10-CM | POA: Diagnosis not present

## 2020-02-24 DIAGNOSIS — I10 Essential (primary) hypertension: Secondary | ICD-10-CM | POA: Diagnosis not present

## 2020-02-24 DIAGNOSIS — G546 Phantom limb syndrome with pain: Secondary | ICD-10-CM | POA: Diagnosis not present

## 2020-02-24 DIAGNOSIS — E038 Other specified hypothyroidism: Secondary | ICD-10-CM | POA: Diagnosis not present

## 2020-02-24 DIAGNOSIS — F411 Generalized anxiety disorder: Secondary | ICD-10-CM | POA: Diagnosis not present

## 2020-02-24 DIAGNOSIS — F329 Major depressive disorder, single episode, unspecified: Secondary | ICD-10-CM | POA: Diagnosis not present

## 2020-02-24 DIAGNOSIS — Z5181 Encounter for therapeutic drug level monitoring: Secondary | ICD-10-CM | POA: Diagnosis not present

## 2020-02-24 DIAGNOSIS — E119 Type 2 diabetes mellitus without complications: Secondary | ICD-10-CM | POA: Diagnosis not present

## 2020-02-24 DIAGNOSIS — D518 Other vitamin B12 deficiency anemias: Secondary | ICD-10-CM | POA: Diagnosis not present

## 2020-02-24 DIAGNOSIS — K219 Gastro-esophageal reflux disease without esophagitis: Secondary | ICD-10-CM | POA: Diagnosis not present

## 2020-03-23 DIAGNOSIS — R21 Rash and other nonspecific skin eruption: Secondary | ICD-10-CM | POA: Diagnosis not present

## 2020-03-23 DIAGNOSIS — M25552 Pain in left hip: Secondary | ICD-10-CM | POA: Diagnosis not present

## 2020-03-23 DIAGNOSIS — Z3041 Encounter for surveillance of contraceptive pills: Secondary | ICD-10-CM | POA: Diagnosis not present

## 2020-03-23 DIAGNOSIS — I2699 Other pulmonary embolism without acute cor pulmonale: Secondary | ICD-10-CM | POA: Diagnosis not present

## 2020-03-23 DIAGNOSIS — E038 Other specified hypothyroidism: Secondary | ICD-10-CM | POA: Diagnosis not present

## 2020-03-23 DIAGNOSIS — M25551 Pain in right hip: Secondary | ICD-10-CM | POA: Diagnosis not present

## 2020-03-23 DIAGNOSIS — K219 Gastro-esophageal reflux disease without esophagitis: Secondary | ICD-10-CM | POA: Diagnosis not present

## 2020-03-23 DIAGNOSIS — G546 Phantom limb syndrome with pain: Secondary | ICD-10-CM | POA: Diagnosis not present

## 2020-03-23 DIAGNOSIS — F3341 Major depressive disorder, recurrent, in partial remission: Secondary | ICD-10-CM | POA: Diagnosis not present

## 2021-01-28 DIAGNOSIS — C50919 Malignant neoplasm of unspecified site of unspecified female breast: Secondary | ICD-10-CM

## 2021-01-28 HISTORY — DX: Malignant neoplasm of unspecified site of unspecified female breast: C50.919

## 2021-03-19 NOTE — Progress Notes (Signed)
Initial phone  contact with newly diagnosed breast cancer pt today. Discussed surgical options and pt requests appt with Dr. Jerel Shepherd. Appt made for pt for 03/27/2021 at 11:15. Pt aware of appt. Records faxed to Dr. Venita Sheffield office. Pt to come by and pick up copy of "The Breast Cancer Treatment Handbook." . Navigator contact info given. Discussed the steps of the breast cancer journey including surgical consult, genetic testing, rad onc and med onc. Encouraged pt to call with questions or concerns.

## 2021-03-27 DIAGNOSIS — C50411 Malignant neoplasm of upper-outer quadrant of right female breast: Secondary | ICD-10-CM | POA: Insufficient documentation

## 2021-03-28 ENCOUNTER — Other Ambulatory Visit: Payer: Self-pay | Admitting: Vascular Surgery

## 2021-03-28 DIAGNOSIS — C50912 Malignant neoplasm of unspecified site of left female breast: Secondary | ICD-10-CM

## 2021-04-18 ENCOUNTER — Other Ambulatory Visit: Payer: PPO

## 2021-05-03 ENCOUNTER — Ambulatory Visit
Admission: RE | Admit: 2021-05-03 | Discharge: 2021-05-03 | Disposition: A | Discharge status: Home / Self Care | Payer: Medicare HMO | Source: Ambulatory Visit | Attending: Vascular Surgery | Admitting: Vascular Surgery

## 2021-05-03 ENCOUNTER — Other Ambulatory Visit: Payer: Self-pay

## 2021-05-03 DIAGNOSIS — C50912 Malignant neoplasm of unspecified site of left female breast: Secondary | ICD-10-CM

## 2021-05-03 MED ORDER — GADOBUTROL 1 MMOL/ML IV SOLN
10.0000 mL | Freq: Once | INTRAVENOUS | Status: AC | PRN
  Administered 2021-05-03: 10 mL via INTRAVENOUS

## 2021-05-11 ENCOUNTER — Other Ambulatory Visit: Payer: Self-pay | Admitting: Vascular Surgery

## 2021-05-11 DIAGNOSIS — R9389 Abnormal findings on diagnostic imaging of other specified body structures: Secondary | ICD-10-CM

## 2021-05-14 ENCOUNTER — Other Ambulatory Visit: Payer: Self-pay

## 2021-05-14 ENCOUNTER — Ambulatory Visit
Admission: RE | Admit: 2021-05-14 | Discharge: 2021-05-14 | Disposition: A | Discharge status: Home / Self Care | Payer: Medicare HMO | Source: Ambulatory Visit | Attending: Vascular Surgery | Admitting: Vascular Surgery

## 2021-05-14 DIAGNOSIS — R9389 Abnormal findings on diagnostic imaging of other specified body structures: Secondary | ICD-10-CM

## 2021-05-14 DIAGNOSIS — C50411 Malignant neoplasm of upper-outer quadrant of right female breast: Secondary | ICD-10-CM | POA: Insufficient documentation

## 2021-05-14 HISTORY — DX: Malignant (primary) neoplasm, unspecified: C80.1

## 2021-05-14 HISTORY — PX: BREAST BIOPSY: SHX20

## 2021-05-14 MED ORDER — GADOBUTROL 1 MMOL/ML IV SOLN
10.0000 mL | Freq: Once | INTRAVENOUS | Status: AC | PRN
  Administered 2021-05-14: 10 mL via INTRAVENOUS

## 2021-05-15 ENCOUNTER — Other Ambulatory Visit: Payer: Self-pay | Admitting: Vascular Surgery

## 2021-05-25 NOTE — Progress Notes (Signed)
Face to face visit with pt in day surgery awaiting an OR suite to open up. Pt has family present. She admits that she is nervous but is ready to go on with the procedure. Pt is having a mastectomy.today.

## 2021-06-05 DIAGNOSIS — C773 Secondary and unspecified malignant neoplasm of axilla and upper limb lymph nodes: Secondary | ICD-10-CM | POA: Insufficient documentation

## 2021-06-05 DIAGNOSIS — Z09 Encounter for follow-up examination after completed treatment for conditions other than malignant neoplasm: Secondary | ICD-10-CM | POA: Insufficient documentation

## 2021-06-17 ENCOUNTER — Other Ambulatory Visit: Payer: Self-pay | Admitting: Oncology

## 2021-06-17 DIAGNOSIS — Z170 Estrogen receptor positive status [ER+]: Secondary | ICD-10-CM

## 2021-06-17 DIAGNOSIS — C50411 Malignant neoplasm of upper-outer quadrant of right female breast: Secondary | ICD-10-CM

## 2021-06-19 ENCOUNTER — Encounter: Payer: Self-pay | Admitting: Oncology

## 2021-06-19 ENCOUNTER — Other Ambulatory Visit: Payer: Self-pay | Admitting: Oncology

## 2021-06-19 ENCOUNTER — Other Ambulatory Visit: Payer: Self-pay

## 2021-06-19 ENCOUNTER — Inpatient Hospital Stay: Payer: Medicare HMO | Attending: Oncology | Admitting: Oncology

## 2021-06-19 ENCOUNTER — Inpatient Hospital Stay: Payer: Medicare HMO

## 2021-06-19 VITALS — BP 163/96 | HR 71 | Temp 98.3°F | Resp 18

## 2021-06-19 DIAGNOSIS — Z9011 Acquired absence of right breast and nipple: Secondary | ICD-10-CM | POA: Diagnosis not present

## 2021-06-19 DIAGNOSIS — D518 Other vitamin B12 deficiency anemias: Secondary | ICD-10-CM | POA: Insufficient documentation

## 2021-06-19 DIAGNOSIS — Z5181 Encounter for therapeutic drug level monitoring: Secondary | ICD-10-CM | POA: Insufficient documentation

## 2021-06-19 DIAGNOSIS — B029 Zoster without complications: Secondary | ICD-10-CM | POA: Insufficient documentation

## 2021-06-19 DIAGNOSIS — Z87891 Personal history of nicotine dependence: Secondary | ICD-10-CM | POA: Insufficient documentation

## 2021-06-19 DIAGNOSIS — C50411 Malignant neoplasm of upper-outer quadrant of right female breast: Secondary | ICD-10-CM

## 2021-06-19 DIAGNOSIS — Z3041 Encounter for surveillance of contraceptive pills: Secondary | ICD-10-CM | POA: Insufficient documentation

## 2021-06-19 DIAGNOSIS — J302 Other seasonal allergic rhinitis: Secondary | ICD-10-CM | POA: Insufficient documentation

## 2021-06-19 DIAGNOSIS — Z86711 Personal history of pulmonary embolism: Secondary | ICD-10-CM | POA: Insufficient documentation

## 2021-06-19 DIAGNOSIS — G546 Phantom limb syndrome with pain: Secondary | ICD-10-CM | POA: Insufficient documentation

## 2021-06-19 DIAGNOSIS — E119 Type 2 diabetes mellitus without complications: Secondary | ICD-10-CM | POA: Insufficient documentation

## 2021-06-19 DIAGNOSIS — D511 Vitamin B12 deficiency anemia due to selective vitamin B12 malabsorption with proteinuria: Secondary | ICD-10-CM | POA: Insufficient documentation

## 2021-06-19 DIAGNOSIS — G4700 Insomnia, unspecified: Secondary | ICD-10-CM | POA: Insufficient documentation

## 2021-06-19 DIAGNOSIS — Z170 Estrogen receptor positive status [ER+]: Secondary | ICD-10-CM | POA: Diagnosis not present

## 2021-06-19 DIAGNOSIS — F329 Major depressive disorder, single episode, unspecified: Secondary | ICD-10-CM | POA: Insufficient documentation

## 2021-06-19 DIAGNOSIS — J449 Chronic obstructive pulmonary disease, unspecified: Secondary | ICD-10-CM | POA: Insufficient documentation

## 2021-06-19 DIAGNOSIS — L03313 Cellulitis of chest wall: Secondary | ICD-10-CM

## 2021-06-19 DIAGNOSIS — F3340 Major depressive disorder, recurrent, in remission, unspecified: Secondary | ICD-10-CM | POA: Insufficient documentation

## 2021-06-19 DIAGNOSIS — H6120 Impacted cerumen, unspecified ear: Secondary | ICD-10-CM | POA: Insufficient documentation

## 2021-06-19 DIAGNOSIS — M25551 Pain in right hip: Secondary | ICD-10-CM | POA: Insufficient documentation

## 2021-06-19 DIAGNOSIS — K219 Gastro-esophageal reflux disease without esophagitis: Secondary | ICD-10-CM | POA: Insufficient documentation

## 2021-06-19 DIAGNOSIS — K921 Melena: Secondary | ICD-10-CM | POA: Insufficient documentation

## 2021-06-19 DIAGNOSIS — F4310 Post-traumatic stress disorder, unspecified: Secondary | ICD-10-CM | POA: Insufficient documentation

## 2021-06-19 DIAGNOSIS — Z780 Asymptomatic menopausal state: Secondary | ICD-10-CM | POA: Insufficient documentation

## 2021-06-19 DIAGNOSIS — F411 Generalized anxiety disorder: Secondary | ICD-10-CM | POA: Insufficient documentation

## 2021-06-19 DIAGNOSIS — E876 Hypokalemia: Secondary | ICD-10-CM | POA: Insufficient documentation

## 2021-06-19 DIAGNOSIS — N6310 Unspecified lump in the right breast, unspecified quadrant: Secondary | ICD-10-CM | POA: Insufficient documentation

## 2021-06-19 DIAGNOSIS — E559 Vitamin D deficiency, unspecified: Secondary | ICD-10-CM | POA: Insufficient documentation

## 2021-06-19 DIAGNOSIS — L209 Atopic dermatitis, unspecified: Secondary | ICD-10-CM | POA: Insufficient documentation

## 2021-06-19 DIAGNOSIS — R21 Rash and other nonspecific skin eruption: Secondary | ICD-10-CM | POA: Insufficient documentation

## 2021-06-19 LAB — BASIC METABOLIC PANEL
BUN: 4 (ref 4–21)
CO2: 24 — AB (ref 13–22)
Chloride: 106 (ref 99–108)
Creatinine: 0.5 (ref 0.5–1.1)
Glucose: 94
Potassium: 3.9 mEq/L (ref 3.5–5.1)
Sodium: 137 (ref 137–147)

## 2021-06-19 LAB — COMPREHENSIVE METABOLIC PANEL
Albumin: 3.4 — AB (ref 3.5–5.0)
Calcium: 8.7 (ref 8.7–10.7)

## 2021-06-19 LAB — CBC AND DIFFERENTIAL
HCT: 45 (ref 36–46)
Hemoglobin: 14.8 (ref 12.0–16.0)
Neutrophils Absolute: 4.55
Platelets: 204 10*3/uL (ref 150–400)
WBC: 6.5

## 2021-06-19 LAB — HEPATIC FUNCTION PANEL
ALT: 29 U/L (ref 7–35)
AST: 52 — AB (ref 13–35)
Alkaline Phosphatase: 116 (ref 25–125)
Bilirubin, Total: 0.7

## 2021-06-19 LAB — CBC: RBC: 4.7 (ref 3.87–5.11)

## 2021-06-19 MED ORDER — CEPHALEXIN 500 MG PO CAPS: 500.0000 mg | ORAL_CAPSULE | Freq: Four times a day (QID) | ORAL | 0 refills | Status: AC

## 2021-06-19 NOTE — Progress Notes (Signed)
Face to face with pt and family members in [employer]. Pt is here for her initial Medical Oncology consult with DR. McCarty. Pt appears rested and reports that she is doing well after her surgery. Encourgaged pt to call with questions or concerns. ?

## 2021-06-20 ENCOUNTER — Other Ambulatory Visit: Payer: Self-pay

## 2021-06-20 ENCOUNTER — Telehealth: Payer: Self-pay

## 2021-06-20 LAB — LUTEINIZING HORMONE: LH: 73.4 m[IU]/mL

## 2021-06-20 LAB — ESTRADIOL: Estradiol: <5 pg/mL

## 2021-06-20 LAB — FOLLICLE STIMULATING HORMONE: FSH: 187 m[IU]/mL

## 2021-06-20 NOTE — Telephone Encounter (Signed)
Referral order placed in computer ?

## 2021-06-20 NOTE — Telephone Encounter (Signed)
-----   Message from [name], MD sent at 06/19/2021  1:39 PM EDT ----- ?Regarding: referral ?Pls refer genetics counsellor - Stage II breast cancer age 51, mat grandmother had breast cancer, stage II or III, also breast cancer in maternal aunt and paternal cousin ? ?
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
## 2021-07-03 ENCOUNTER — Inpatient Hospital Stay (INDEPENDENT_AMBULATORY_CARE_PROVIDER_SITE_OTHER): Payer: Medicare HMO | Admitting: Oncology

## 2021-07-03 ENCOUNTER — Other Ambulatory Visit: Payer: Self-pay | Admitting: Oncology

## 2021-07-03 ENCOUNTER — Telehealth: Payer: Self-pay

## 2021-07-03 ENCOUNTER — Encounter: Payer: Self-pay | Admitting: Oncology

## 2021-07-03 ENCOUNTER — Other Ambulatory Visit: Payer: Self-pay

## 2021-07-03 VITALS — BP 122/82 | HR 78 | Temp 98.0°F | Resp 18

## 2021-07-03 DIAGNOSIS — C773 Secondary and unspecified malignant neoplasm of axilla and upper limb lymph nodes: Secondary | ICD-10-CM

## 2021-07-03 DIAGNOSIS — C50411 Malignant neoplasm of upper-outer quadrant of right female breast: Secondary | ICD-10-CM | POA: Diagnosis not present

## 2021-07-03 DIAGNOSIS — Z170 Estrogen receptor positive status [ER+]: Secondary | ICD-10-CM | POA: Diagnosis not present

## 2021-07-03 NOTE — Progress Notes (Signed)
Face to face with pt in [facility] lobby. Pt is here today to discuss treatment recommendations with Dr. Hinton Rao. Pt reports  that she is nervous about today's outcome. Reminded pt that no matter what the future holds, she will have an entire team to help her through it. ?

## 2021-07-03 NOTE — Telephone Encounter (Signed)
Mallory Ayers with Dr. Venita Sheffield office They state that the earliest they can get patient in for port placement is April 17th at 11:45am. If this ok with you. I will call patient and let her know.  ?

## 2021-07-03 NOTE — Telephone Encounter (Signed)
-----   Message from [name], MD sent at 07/03/2021  2:26 PM EDT ----- ?Regarding: refer ?She needs Dr. Noberto Retort to place port, he did her surgery. ? ?

## 2021-07-04 ENCOUNTER — Encounter: Payer: Self-pay | Admitting: Oncology

## 2021-07-04 ENCOUNTER — Other Ambulatory Visit: Payer: Self-pay | Admitting: Oncology

## 2021-07-04 NOTE — Progress Notes (Signed)
START ON PATHWAY REGIMEN - Breast ? ? ?  Cycles 1 through 4: A cycle is every 14 days: ?    Doxorubicin  ?    Cyclophosphamide  ?    Pegfilgrastim-xxxx  ?  Cycles 5 through 16: A cycle is every 7 days: ?    Paclitaxel  ? ?**Always confirm dose/schedule in your pharmacy ordering system** ? ?Patient Characteristics: ?Postoperative without Neoadjuvant Therapy (Pathologic Staging), Invasive Disease, Adjuvant Therapy, HER2 Negative/Unknown/Equivocal, ER Positive, Node Positive, Node Positive (1-3), EndoPredict(R), EPClin High Risk  (3.4 - 8.2) ?Therapeutic Status: Postoperative without Neoadjuvant Therapy (Pathologic Staging) ?AJCC Grade: GX ?AJCC N Category: pNX ?AJCC M Category: cM0 ?ER Status: Positive (+) ?AJCC 8 Stage Grouping: IB ?HER2 Status: Negative (-) ?Oncotype Dx Recurrence Score: Ordered Other Genomic Test ?AJCC T Category: pTX ?PR Status: Positive (+) ?Adjuvant Therapy Status: No Adjuvant Therapy Received Yet or Changing Initial Adjuvant Regimen due to Tolerance ?Has this patient completed genomic testing<= Yes - EndoPredict(R) ?EPClin Risk Score: EPClin High Risk (3.4 - 8.2) ?Intent of Therapy: ?Curative Intent, Discussed with Patient ?

## 2021-07-04 NOTE — Progress Notes (Unsigned)
Per [employer] CPT lookup on web-page: No PA required for; ? ?CPT code: 93306 ?  ? ?The procedure code you entered was not found on the [facility] Participating Provider Medical Precertification List. If you?re a participating provider, no precertification is required when this service is performed as an outpatient procedure for a medical or surgical diagnosis.  ?

## 2021-07-06 ENCOUNTER — Encounter: Payer: Self-pay | Admitting: Hematology and Oncology

## 2021-07-06 ENCOUNTER — Inpatient Hospital Stay (INDEPENDENT_AMBULATORY_CARE_PROVIDER_SITE_OTHER): Payer: Medicare HMO | Admitting: Hematology and Oncology

## 2021-07-06 VITALS — BP 123/87 | HR 78 | Temp 98.5°F | Resp 18 | Ht <= 58 in | Wt 170.0 lb

## 2021-07-06 DIAGNOSIS — C50411 Malignant neoplasm of upper-outer quadrant of right female breast: Secondary | ICD-10-CM

## 2021-07-06 DIAGNOSIS — Z170 Estrogen receptor positive status [ER+]: Secondary | ICD-10-CM

## 2021-07-06 MED ORDER — ONDANSETRON HCL 4 MG PO TABS
4.0000 mg | ORAL_TABLET | ORAL | 3 refills | Status: DC | PRN
Start: 2021-07-06 — End: 2021-09-07

## 2021-07-06 MED ORDER — PROCHLORPERAZINE MALEATE 10 MG PO TABS
10.0000 mg | ORAL_TABLET | Freq: Four times a day (QID) | ORAL | 3 refills | Status: DC | PRN
Start: 2021-07-06 — End: 2021-12-20

## 2021-07-06 MED ORDER — LORAZEPAM 1 MG PO TABS: 1.0000 mg | ORAL_TABLET | Freq: Three times a day (TID) | ORAL | 0 refills | Status: DC
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
## 2021-07-10 ENCOUNTER — Telehealth: Payer: Self-pay

## 2021-07-10 ENCOUNTER — Encounter: Payer: Self-pay | Admitting: Oncology

## 2021-07-10 ENCOUNTER — Ambulatory Visit (INDEPENDENT_AMBULATORY_CARE_PROVIDER_SITE_OTHER): Payer: Medicare HMO

## 2021-07-10 DIAGNOSIS — Z170 Estrogen receptor positive status [ER+]: Secondary | ICD-10-CM | POA: Diagnosis not present

## 2021-07-10 DIAGNOSIS — Z0189 Encounter for other specified special examinations: Secondary | ICD-10-CM | POA: Diagnosis not present

## 2021-07-10 DIAGNOSIS — C50411 Malignant neoplasm of upper-outer quadrant of right female breast: Secondary | ICD-10-CM | POA: Diagnosis not present

## 2021-07-10 LAB — ECHOCARDIOGRAM COMPLETE
Area-P 1/2: 3.51 cm2
S' Lateral: 2.6 cm

## 2021-07-10 NOTE — Telephone Encounter (Signed)
-----   Message from Melodye Ped, NP sent at 07/10/2021  3:09 PM EDT ----- ?Regarding: RE: prescription for bras ?Prescription written ?----- Message ----- ?From: Georgette Shell, RN ?Sent: 07/10/2021  12:00 PM EDT ?To: Melodye Ped, NP ?Subject: prescription for bras                         ? ?Needs prescription for mastectomy supplies. ? ?Thanks.  ? ? ?

## 2021-07-11 ENCOUNTER — Other Ambulatory Visit: Payer: Self-pay | Admitting: Pharmacist

## 2021-07-11 NOTE — Progress Notes (Signed)
The following biosimilar Fulphila ([id number]) has been selected for use in this patient.  

## 2021-07-12 ENCOUNTER — Other Ambulatory Visit: Payer: Medicare HMO

## 2021-07-12 ENCOUNTER — Encounter: Payer: Self-pay | Admitting: Oncology

## 2021-07-13 ENCOUNTER — Other Ambulatory Visit: Payer: Self-pay | Admitting: Oncology

## 2021-07-16 ENCOUNTER — Ambulatory Visit: Payer: Medicare HMO

## 2021-07-17 ENCOUNTER — Telehealth: Payer: Self-pay | Admitting: Medical Oncology

## 2021-07-17 ENCOUNTER — Encounter: Payer: Self-pay | Admitting: Oncology

## 2021-07-17 NOTE — Telephone Encounter (Signed)
[id number] - TREATMENT OF REFRACTORY NAUSEA : Testing the Addition of Two Approved Drugs, Olanzapine or Prochlorperazine, for Persistent Nausea ? ?Outgoing call: 1400 ?Patient referred to study by Dr. Hinton Rao. ?I called patient to introduce myself and to introduce study. I provided patient with a brief explanation as to what the study is about and inquired with patient if she would be interested in more information and possible participation. Patient confirmed that she was, to both questions. Patient was asked if I could email her the study consent form for her to review what the study is about and so that she can make an informed decision. Patient stated she would like that and confirmed her email address. Patient has a lab appointment Friday, April 14th and we made plans for me to call her Thursday (13th)  to see if she had any questions and to schedule a time for [place] to meet and sign consent, should she be interested in participating.  ? ?Patient was asked a few of the inclusion and exclusion questions during this phone conversation: ?Menopausal status: patient states she is postmenopausal. June 19, 2021 labs confirm. ?Central nervous system disease: patient confirms no seizure disorder.  ?Benzodiazepines: patient has a prescription for ativan, she confirms to take it infrequently and less than 5 days within a month.  ? ?Patient denied any questions, was thanked for her time and was encouraged to call me with any questions she may have in the meantime. My contact information was provided.  ? ?Maxwell Marion, RN, BSN, [place] ?Clinical Research Nurse Lead ?07/17/2021 2:30 PM ?

## 2021-07-18 ENCOUNTER — Ambulatory Visit: Payer: Medicare HMO

## 2021-07-19 ENCOUNTER — Telehealth: Payer: Self-pay | Admitting: Medical Oncology

## 2021-07-19 ENCOUNTER — Other Ambulatory Visit: Payer: Medicare HMO

## 2021-07-19 NOTE — Telephone Encounter (Signed)
[id number] - TREATMENT OF REFRACTORY NAUSEA : Testing the Addition of Two Approved Drugs, Olanzapine or Prochlorperazine, for Persistent Nausea ? ?Outgoing call: 11:15 ?Follow up call to patient regarding study and information emailed to her two days ago. I asked patient if she had received the study ICF's for her review and if she had some time to read over the information. Patient states that she read some of it and plans to finish the rest of today. Patient confirms that she is still interested in study participation. Patient denied having any questions at this time about the study. Patient and I made plans to meet tomorrow at 2:00 PM for review and signing of the consent and authorization forms, prior to her lab appointment at 2:30.  ?Patient thanked and encouraged to call with questions in the mean time.  ? ?Maxwell Marion, RN, BSN, [place] ?Clinical Research Nurse Lead ?07/19/2021 11:27 AM ? ?

## 2021-07-20 ENCOUNTER — Encounter: Payer: Self-pay | Admitting: Medical Oncology

## 2021-07-20 ENCOUNTER — Inpatient Hospital Stay: Payer: Medicare HMO | Attending: Oncology

## 2021-07-20 DIAGNOSIS — Z170 Estrogen receptor positive status [ER+]: Secondary | ICD-10-CM

## 2021-07-20 DIAGNOSIS — Z5111 Encounter for antineoplastic chemotherapy: Secondary | ICD-10-CM | POA: Insufficient documentation

## 2021-07-20 DIAGNOSIS — Z5189 Encounter for other specified aftercare: Secondary | ICD-10-CM | POA: Insufficient documentation

## 2021-07-20 DIAGNOSIS — C50411 Malignant neoplasm of upper-outer quadrant of right female breast: Secondary | ICD-10-CM | POA: Insufficient documentation

## 2021-07-20 LAB — COMPREHENSIVE METABOLIC PANEL
Albumin: 3.4 — AB (ref 3.5–5.0)
Calcium: 8.4 — AB (ref 8.7–10.7)

## 2021-07-20 LAB — CBC AND DIFFERENTIAL
HCT: 41 (ref 36–46)
Hemoglobin: 13.9 (ref 12.0–16.0)
Neutrophils Absolute: 3.54
Platelets: 189 10*3/uL (ref 150–400)
WBC: 5.9

## 2021-07-20 LAB — HEPATIC FUNCTION PANEL
ALT: 70 U/L — AB (ref 7–35)
AST: 159 — AB (ref 13–35)
Alkaline Phosphatase: 111 (ref 25–125)
Bilirubin, Total: 0.5

## 2021-07-20 LAB — BASIC METABOLIC PANEL
BUN: 3 — AB (ref 4–21)
CO2: 26 — AB (ref 13–22)
Chloride: 106 (ref 99–108)
Creatinine: 0.6 (ref 0.5–1.1)
Glucose: 117
Potassium: 3.8 mEq/L (ref 3.5–5.1)
Sodium: 140 (ref 137–147)

## 2021-07-20 LAB — CBC: RBC: 4.23 (ref 3.87–5.11)

## 2021-07-20 NOTE — Research (Signed)
[place] [address] ? ?This Nurse has reviewed this patient's inclusion and exclusion criteria as a second review and confirms Mallory Ayers is eligible for study participation.  Patient may continue with enrollment.  ?Jeral Fruit, RN ?07/20/21 ?4:33 PM ? ?

## 2021-07-20 NOTE — Progress Notes (Signed)
?Consent Note:  ?Trial Name:  [id number] - TREATMENT OF REFRACTORY NAUSEA ? ?Patient Mallory Ayers was identified by Dr. Hinton Rao as a potential candidate for the above listed study.  This Clinical Research Nurse met with Mallory Ayers, [id number] on 07/20/21 in a manner and location that ensures patient privacy to discuss participation in the above listed research study.  Patient is Accompanied by her fiance .  Patient was previously provided with informed consent documents, via email.  Patient confirms to have read the consent documents provided, but states she had not read them in its entirety. Patient, fiance and I went over the study consent forms, page by page, together,  ? ?As outlined in the informed consent form, this Nurse and Mallory Ayers discussed the purpose of the research study, the investigational nature of the study, study procedures and requirements for study participation, potential risks and benefits of study participation, as well as alternatives to participation.  This study is blinded or double-blinded. The patient understands participation is voluntary and they may withdraw from study participation at any time.  Each study arm was reviewed, and randomization discussed.  Potential side effects were reviewed with patient as outlined in the consent form, and patient made aware there may be side effects not yet known. The chance of receiving placebo was discussed.  ? ?Confidentiality and how the patient's information will be used as part of study participation were discussed.  Patient was informed there is not reimbursement provided for their time and effort spent on trial participation.  The patient is encouraged to discuss research study participation with their insurance provider to determine what costs they may incur as part of study participation, including research related injury.   ? ?All questions were answered to patient's satisfaction.  The informed consent and separate HIPAA  Authorization was reviewed page by page.  The patient's mental and emotional status is appropriate to provide informed consent, and the patient verbalizes an understanding of study participation.  Patient has agreed to participate in the above listed research study and has voluntarily signed the informed consent version Date 04/11/21 and separate HIPAA Authorization, version 10/17/2020  on 07/20/21 at 3:24 PM.  The patient will be provided with a copy of the signed informed consent form and separate HIPAA Authorization for her reference, on Monday when I meet with her prior to her first treatment.  No study specific procedures were obtained prior to the signing of the informed consent document.  Approximately 45 minutes were spent with the patient reviewing the informed consent documents.  After obtaining informed consent patient, voluntarily signed the optional Release of Information form for use throughout trial participation.  ?Patient completed baseline questionnaires, after signing consent.  ? ?We did not have time to draw the patient's baseline labs at this time, [place] and [place] traffic and arrived late to planned meet time. Lab processing was not available after consent signing, due to late in the day. Patient and I made plans for me to meet with her Monday, the 17th, prior to her first chemotherapy treatment, in which we will draw baseline and pre-treatment cycle 1 labs.  ? ? ?This Nurse has reviewed this patient's inclusion and exclusion criteria and confirmed Mallory Ayers is eligible for study participation.  Patient will continue with enrollment. ? ?Menopausal status (women only): Mallory Ayers is post menopausal, labs drawn on 06/19/2021 confirm.  ? ?Eligibility confirmed by treating investigator, who also agrees that patient  should proceed with enrollment.  ? ?Patient was not enrolled to study [facility] today, due to enrollment site missing in study data  base. A note to file will be completed for patient completing baseline questionnaires prior to study enrollment. Patient met eligibility requirements and all study procedures obtained only after patient signed consent and authorization forms to participate. Patient will be registered Monday, April 17th, once enrollment site is added to research base.  ? ?Maxwell Marion, RN, BSN, [place] ?Clinical Research Nurse Lead ?07/20/2021 4:47 PM ? ?

## 2021-07-23 ENCOUNTER — Other Ambulatory Visit: Payer: Self-pay | Admitting: Hematology and Oncology

## 2021-07-23 ENCOUNTER — Other Ambulatory Visit: Payer: Self-pay

## 2021-07-23 ENCOUNTER — Encounter: Payer: Self-pay | Admitting: Oncology

## 2021-07-23 ENCOUNTER — Encounter: Payer: Self-pay | Admitting: Medical Oncology

## 2021-07-23 ENCOUNTER — Inpatient Hospital Stay: Payer: Medicare HMO

## 2021-07-23 ENCOUNTER — Other Ambulatory Visit: Payer: Self-pay | Admitting: Pharmacist

## 2021-07-23 VITALS — BP 129/74 | HR 81 | Temp 97.9°F | Resp 18 | Ht <= 58 in | Wt 171.0 lb

## 2021-07-23 DIAGNOSIS — Z170 Estrogen receptor positive status [ER+]: Secondary | ICD-10-CM

## 2021-07-23 DIAGNOSIS — C50411 Malignant neoplasm of upper-outer quadrant of right female breast: Secondary | ICD-10-CM | POA: Diagnosis present

## 2021-07-23 DIAGNOSIS — Z5111 Encounter for antineoplastic chemotherapy: Secondary | ICD-10-CM | POA: Diagnosis present

## 2021-07-23 DIAGNOSIS — Z5189 Encounter for other specified aftercare: Secondary | ICD-10-CM | POA: Diagnosis not present

## 2021-07-23 MED ORDER — LORAZEPAM 2 MG/ML IJ SOLN
INTRAMUSCULAR | Status: AC
  Filled 2021-07-23: qty 1

## 2021-07-23 MED ORDER — PALONOSETRON HCL INJECTION 0.25 MG/5ML
0.2500 mg | Freq: Once | INTRAVENOUS | Status: AC
  Administered 2021-07-23: 0.25 mg via INTRAVENOUS
  Filled 2021-07-23: qty 5

## 2021-07-23 MED ORDER — DOXORUBICIN HCL CHEMO IV INJECTION 2 MG/ML
60.0000 mg/m2 | Freq: Once | INTRAVENOUS | Status: AC
  Administered 2021-07-23: 94 mg via INTRAVENOUS
  Filled 2021-07-23: qty 47

## 2021-07-23 MED ORDER — LORAZEPAM 2 MG/ML IJ SOLN
1.0000 mg | Freq: Once | INTRAMUSCULAR | Status: AC
  Administered 2021-07-23: 1 mg via INTRAVENOUS

## 2021-07-23 MED ORDER — HEPARIN SOD (PORK) LOCK FLUSH 100 UNIT/ML IV SOLN
500.0000 [IU] | Freq: Once | INTRAVENOUS | Status: AC | PRN
  Administered 2021-07-23: 500 [IU]

## 2021-07-23 MED ORDER — SODIUM CHLORIDE 0.9 % IV SOLN
10.0000 mg | Freq: Once | INTRAVENOUS | Status: AC
  Administered 2021-07-23: 10 mg via INTRAVENOUS
  Filled 2021-07-23: qty 10

## 2021-07-23 MED ORDER — SODIUM CHLORIDE 0.9 % IV SOLN: Freq: Once | INTRAVENOUS | Status: AC

## 2021-07-23 MED ORDER — SODIUM CHLORIDE 0.9 % IV SOLN
600.0000 mg/m2 | Freq: Once | INTRAVENOUS | Status: AC
  Administered 2021-07-23: 940 mg via INTRAVENOUS
  Filled 2021-07-23: qty 47

## 2021-07-23 MED ORDER — SODIUM CHLORIDE 0.9% FLUSH
10.0000 mL | INTRAVENOUS | Status: DC | PRN
  Administered 2021-07-23: 10 mL

## 2021-07-23 MED ORDER — SODIUM CHLORIDE 0.9 % IV SOLN
150.0000 mg | Freq: Once | INTRAVENOUS | Status: AC
  Administered 2021-07-23: 150 mg via INTRAVENOUS
  Filled 2021-07-23: qty 150

## 2021-07-23 NOTE — Progress Notes (Signed)
DCP-001 ? ?Patient was referred to and enrolled to [place] and is eligible to participate in DCP-001 ? ?Patient was provided with copies of the DCP-001 consent and authorization forms for the "Use of a Clinical Trial Screening Tool to Address Cancer Health Disparities in the [place] Program" project. I explained to patient that the study includes individuals regardless of their decision to participate in an NCI clinical trial. The patient is aware that this involves a one-time consent, and collection of demographic variables, with the majority of data collected from her medical record. It was noted to the patient, that no patient identifiers are being reported via the screening tool. Patient knows that participation is voluntary and should she have any questions, to please call me. Patient agreed to take the study consent and authorization forms home to review.  ?Patient denies any questions and was thanked for her time. I informed patient that I will call her at the end of the week to see if she has any questions.  ?Patient provided with my direct contact information.  ? ?Maxwell Marion, RN, BSN, [place] ?Clinical Research Nurse Lead ?07/23/2021 4:38 PM ? ?

## 2021-07-23 NOTE — Progress Notes (Signed)
Trial Name:  [id number] - TREATMENT OF REFRACTORY NAUSEA ? ?Collection of baseline labs: completed ? ?Patient was registered this morning, prior to my meeting with her, to the [facility]. ? ?I met with patient this afternoon, upon her arrival to the infusion room for her first treatment. I reminded patient that I would be collecting study specimen blood that we were unable to collect on Friday, due to it being too late in the day. Patient gave her verbal understanding. Infusion RN, Anderson Malta accessed patient's port and collected required research blood, in my presence and prior to start of treatment, at 12:55 PM.   ? ?I provided patient today with copies of her signed consent and authorization forms for her records. I also provided patient with the cycle 1, [facility] record and explained the form to her and patient stated she understood how to document on the form. Patient gave consent to receive the questionnaires via email, but was also provided with the paper form of  Cycle 1 questionnaires, should she not receive study email link to complete these online. Patient inquired how cycle 2 would work with study and I explained to her the two part process of eligibility, again. Patient gave her verbal understanding.  ? ?I reviewed with patient if she had taken any Ativan this morning, because she expressed feeling nevous on Friday for the start of treatment and had asked if she could take Ativan. Patient stated that she did not take any Ativan for anxiety today.  ? ?All patient's question answered to her satisfaction, patient denies further questions at this time. I thanked patient for her time and encouraged her to call me with questions in the meantime. Patient was made aware, that I will call her on the 20th, to ask her study specific questions regarding any experience with nausea. Patient has my direct contact information to call me, should she need to.  ? ?Maxwell Marion, RN, BSN,  [place] ?Clinical Research Nurse Lead ?07/23/2021 4:27 PM ? ? ?

## 2021-07-23 NOTE — Patient Instructions (Signed)
Cyclophosphamide Injection ?What is this medication? ?CYCLOPHOSPHAMIDE (sye kloe FOSS fa mide) is a chemotherapy drug. It slows the growth of cancer cells. This medicine is used to treat many types of cancer like lymphoma, myeloma, leukemia, breast cancer, and ovarian cancer, to name a few. ?This medicine may be used for other purposes; ask your health care provider or pharmacist if you have questions. ?COMMON BRAND NAME(S): Cyclophosphamide, Cytoxan, Neosar ?What should I tell my care team before I take this medication? ?They need to know if you have any of these conditions: ?heart disease ?history of irregular heartbeat ?infection ?kidney disease ?liver disease ?low blood counts, like white cells, platelets, or red blood cells ?on hemodialysis ?recent or ongoing radiation therapy ?scarring or thickening of the lungs ?trouble passing urine ?an unusual or allergic reaction to cyclophosphamide, other medicines, foods, dyes, or preservatives ?pregnant or trying to get pregnant ?breast-feeding ?How should I use this medication? ?This drug is usually given as an injection into a vein or muscle or by infusion into a vein. It is administered in a hospital or clinic by a specially trained health care professional. ?Talk to your pediatrician regarding the use of this medicine in children. Special care may be needed. ?Overdosage: If you think you have taken too much of this medicine contact a poison control center or emergency room at once. ?NOTE: This medicine is only for you. Do not share this medicine with others. ?What if I miss a dose? ?It is important not to miss your dose. Call your doctor or health care professional if you are unable to keep an appointment. ?What may interact with this medication? ?amphotericin B ?azathioprine ?certain antivirals for HIV or hepatitis ?certain medicines for blood pressure, heart disease, irregular heart beat ?certain medicines that treat or prevent blood clots like warfarin ?certain  other medicines for cancer ?cyclosporine ?etanercept ?indomethacin ?medicines that relax muscles for surgery ?medicines to increase blood counts ?metronidazole ?This list may not describe all possible interactions. Give your health care provider a list of all the medicines, herbs, non-prescription drugs, or dietary supplements you use. Also tell them if you smoke, drink alcohol, or use illegal drugs. Some items may interact with your medicine. ?What should I watch for while using this medication? ?Your condition will be monitored carefully while you are receiving this medicine. ?You may need blood work done while you are taking this medicine. ?Drink water or other fluids as directed. Urinate often, even at night. ?Some products may contain alcohol. Ask your health care professional if this medicine contains alcohol. Be sure to tell all health care professionals you are taking this medicine. Certain medicines, like metronidazole and disulfiram, can cause an unpleasant reaction when taken with alcohol. The reaction includes flushing, headache, nausea, vomiting, sweating, and increased thirst. The reaction can last from 30 minutes to several hours. ?Do not become pregnant while taking this medicine or for 1 year after stopping it. Women should inform their health care professional if they wish to become pregnant or think they might be pregnant. Men should not father a child while taking this medicine and for 4 months after stopping it. There is potential for serious side effects to an unborn child. Talk to your health care professional for more information. ?Do not breast-feed an infant while taking this medicine or for 1 week after stopping it. ?This medicine has caused ovarian failure in some women. This medicine may make it more difficult to get pregnant. Talk to your health care professional if you are  concerned about your fertility. ?This medicine has caused decreased sperm counts in some men. This may make it  more difficult to father a child. Talk to your health care professional if you are concerned about your fertility. ?Call your health care professional for advice if you get a fever, chills, or sore throat, or other symptoms of a cold or flu. Do not treat yourself. This medicine decreases your body's ability to fight infections. Try to avoid being around people who are sick. ?Avoid taking medicines that contain aspirin, acetaminophen, ibuprofen, naproxen, or ketoprofen unless instructed by your health care professional. These medicines may hide a fever. ?Talk to your health care professional about your risk of cancer. You may be more at risk for certain types of cancer if you take this medicine. ?If you are going to need surgery or other procedure, tell your health care professional that you are using this medicine. ?Be careful brushing or flossing your teeth or using a toothpick because you may get an infection or bleed more easily. If you have any dental work done, tell your dentist you are receiving this medicine. ?What side effects may I notice from receiving this medication? ?Side effects that you should report to your doctor or health care professional as soon as possible: ?allergic reactions like skin rash, itching or hives, swelling of the face, lips, or tongue ?breathing problems ?nausea, vomiting ?signs and symptoms of bleeding such as bloody or black, tarry stools; red or dark brown urine; spitting up blood or brown material that looks like coffee grounds; red spots on the skin; unusual bruising or bleeding from the eyes, gums, or nose ?signs and symptoms of heart failure like fast, irregular heartbeat, sudden weight gain; swelling of the ankles, feet, hands ?signs and symptoms of infection like fever; chills; cough; sore throat; pain or trouble passing urine ?signs and symptoms of kidney injury like trouble passing urine or change in the amount of urine ?signs and symptoms of liver injury like dark yellow  or brown urine; general ill feeling or flu-like symptoms; light-colored stools; loss of appetite; nausea; right upper belly pain; unusually weak or tired; yellowing of the eyes or skin ?Side effects that usually do not require medical attention (report to your doctor or health care professional if they continue or are bothersome): ?confusion ?decreased hearing ?diarrhea ?facial flushing ?hair loss ?headache ?loss of appetite ?missed menstrual periods ?signs and symptoms of low red blood cells or anemia such as unusually weak or tired; feeling faint or lightheaded; falls ?skin discoloration ?This list may not describe all possible side effects. Call your doctor for medical advice about side effects. You may report side effects to FDA at 1-800-FDA-1088. ?Where should I keep my medication? ?This drug is given in a hospital or clinic and will not be stored at home. ?NOTE: This sheet is a summary. It may not cover all possible information. If you have questions about this medicine, talk to your doctor, pharmacist, or health care provider. ?? 2023 Elsevier/Gold Standard (2021-02-23 00:00:00) ?Doxorubicin injection ?What is this medication? ?DOXORUBICIN (dox oh ROO bi sin) is a chemotherapy drug. It is used to treat many kinds of cancer like leukemia, lymphoma, neuroblastoma, sarcoma, and Wilms' tumor. It is also used to treat bladder cancer, breast cancer, lung cancer, ovarian cancer, stomach cancer, and thyroid cancer. ?This medicine may be used for other purposes; ask your health care provider or pharmacist if you have questions. ?COMMON BRAND NAME(S): Adriamycin, Adriamycin PFS, Adriamycin RDF, Rubex ?What should I  tell my care team before I take this medication? ?They need to know if you have any of these conditions: ?heart disease ?history of low blood counts caused by a medicine ?liver disease ?recent or ongoing radiation therapy ?an unusual or allergic reaction to doxorubicin, other chemotherapy agents, other  medicines, foods, dyes, or preservatives ?pregnant or trying to get pregnant ?breast-feeding ?How should I use this medication? ?This drug is given as an infusion into a vein. It is administered in a hospital

## 2021-07-24 ENCOUNTER — Telehealth: Payer: Self-pay

## 2021-07-24 ENCOUNTER — Encounter: Payer: Self-pay | Admitting: Oncology

## 2021-07-24 NOTE — Telephone Encounter (Signed)
I spoke with pt to see how she is doing today. She replied, "little groggy". She sounded like I may have awakened her from a nap. Pt reports intermittent nausea and was relieved with antiemetic. No emesis. She denies fevers, rash/itching, diarrhea, and SOB. I reminded pt to call us if she develops temp of 100.4 or higher, day or night. She verbalized understanding. I confirmed her appt tomorrow for injection. I also reminded her to take Claritin '10mg'$  po qd starting tomorrow and for 5 days to lessen bone pain that may occur from the injection. She verbalized understanding. ?

## 2021-07-24 NOTE — Progress Notes (Addendum)
Addendum created to add patient's baseline AE's: ? ?After patient was consented, I reviewed with her any baseline medical issues/AE's she may have. Patient only reports having "lots of anxiety" and takes ativan when needed, but states she has not had too take it, as previously discussed during eligibility screening, often (less than 5 days in the past 30 days). 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 Patient denies any other issues/concerns.  ?Grade 1 Hypertension, per 07/06/21 assessment. ? ?Maxwell Marion, RN, BSN, [place] ?Clinical Research Nurse Lead ?07/24/2021 1:27 PM ? ?

## 2021-07-25 ENCOUNTER — Encounter: Payer: Self-pay | Admitting: Oncology

## 2021-07-25 ENCOUNTER — Inpatient Hospital Stay: Payer: Medicare HMO

## 2021-07-25 VITALS — BP 131/91 | HR 72 | Temp 97.9°F | Resp 18

## 2021-07-25 DIAGNOSIS — Z5111 Encounter for antineoplastic chemotherapy: Secondary | ICD-10-CM | POA: Diagnosis not present

## 2021-07-25 DIAGNOSIS — Z170 Estrogen receptor positive status [ER+]: Secondary | ICD-10-CM

## 2021-07-25 LAB — URINALYSIS, COMPLETE (UACMP) WITH MICROSCOPIC
Bacteria, UA: NONE SEEN
Bilirubin Urine: NEGATIVE
Glucose, UA: NEGATIVE mg/dL
Hgb urine dipstick: NEGATIVE
Ketones, ur: NEGATIVE mg/dL
Leukocytes,Ua: NEGATIVE
Nitrite: NEGATIVE
Protein, ur: NEGATIVE mg/dL
Specific Gravity, Urine: 1.005 (ref 1.005–1.030)
pH: 5 (ref 5.0–8.0)

## 2021-07-25 MED ORDER — SODIUM CHLORIDE 0.9 % IV SOLN: Freq: Once | INTRAVENOUS | Status: AC

## 2021-07-25 MED ORDER — PEGFILGRASTIM-JMDB 6 MG/0.6ML ~~LOC~~ SOSY
6.0000 mg | PREFILLED_SYRINGE | Freq: Once | SUBCUTANEOUS | Status: AC
  Administered 2021-07-25: 6 mg via SUBCUTANEOUS
  Filled 2021-07-25: qty 0.6

## 2021-07-25 NOTE — Progress Notes (Signed)
Patient awake oriented to place,time and situation. Fluids continue without difficulty.  ?

## 2021-07-25 NOTE — Progress Notes (Signed)
Patient discharged awake and alert. States that she feels better. Oriented to person, place and time. States that she feels hungry ? ?

## 2021-07-25 NOTE — Progress Notes (Signed)
[id number] Patient presented to clinic with tremors and ruddy complexion. States that she has felt confused all morning and very sleepy. Significant other reports excessive sleeping and some confusion. BP elevated (see flowsheet). Ulice Dash and Valli Glance notified.  ?

## 2021-07-25 NOTE — Progress Notes (Signed)
[id number]- Patient is more awake, able to transfer self to toilet or recliner without difficulty. States that she feels like she has "brain fog". See flowsheet for follow up BP- Ulice Dash and Johnsie Kindred aware.  ?

## 2021-07-25 NOTE — Patient Instructions (Signed)
Pegfilgrastim Injection ?What is this medication? ?PEGFILGRASTIM (PEG fil gra stim) lowers the risk of infection in people who are receiving chemotherapy. It works by helping your body make more white blood cells, which protects your body from infection. It may also be used to help people who have been exposed to high doses of radiation. ?This medicine may be used for other purposes; ask your health care provider or pharmacist if you have questions. ?COMMON BRAND NAME(S): Fulphila, Fylnetra, Neulasta, Nyvepria, Stimufend, UDENYCA, Ziextenzo ?What should I tell my care team before I take this medication? ?They need to know if you have any of these conditions: ?Kidney disease ?Latex allergy ?Ongoing radiation therapy ?Sickle cell disease ?Skin reactions to acrylic adhesives (On-Body Injector only) ?An unusual or allergic reaction to pegfilgrastim, filgrastim, other medications, foods, dyes, or preservatives ?Pregnant or trying to get pregnant ?Breast-feeding ?How should I use this medication? ?This medication is for injection under the skin. If you get this medication at home, you will be taught how to prepare and give the pre-filled syringe or how to use the On-body Injector. Refer to the patient Instructions for Use for detailed instructions. Use exactly as directed. Tell your care team immediately if you suspect that the On-body Injector may not have performed as intended or if you suspect the use of the On-body Injector resulted in a missed or partial dose. ?It is important that you put your used needles and syringes in a special sharps container. Do not put them in a trash can. If you do not have a sharps container, call your pharmacist or care team to get one. ?Talk to your care team about the use of this medication in children. While this medication may be prescribed for selected conditions, precautions do apply. ?Overdosage: If you think you have taken too much of this medicine contact a poison control center  or emergency room at once. ?NOTE: This medicine is only for you. Do not share this medicine with others. ?What if I miss a dose? ?It is important not to miss your dose. Call your care team if you miss your dose. If you miss a dose due to an On-body Injector failure or leakage, a new dose should be administered as soon as possible using a single prefilled syringe for manual use. ?What may interact with this medication? ?Interactions have not been studied. ?This list may not describe all possible interactions. Give your health care provider a list of all the medicines, herbs, non-prescription drugs, or dietary supplements you use. Also tell them if you smoke, drink alcohol, or use illegal drugs. Some items may interact with your medicine. ?What should I watch for while using this medication? ?Your condition will be monitored carefully while you are receiving this medication. ?You may need blood work done while you are taking this medication. ?Talk to your care team about your risk of cancer. You may be more at risk for certain types of cancer if you take this medication. ?If you are going to need a MRI, CT scan, or other procedure, tell your care team that you are using this medication (On-Body Injector only). ?What side effects may I notice from receiving this medication? ?Side effects that you should report to your care team as soon as possible: ?Allergic reactions--skin rash, itching, hives, swelling of the face, lips, tongue, or throat ?Capillary leak syndrome--stomach or muscle pain, unusual weakness or fatigue, feeling faint or lightheaded, decrease in the amount of urine, swelling of the ankles, hands, or feet, trouble   breathing ?High white blood cell level--fever, fatigue, trouble breathing, night sweats, change in vision, weight loss ?Inflammation of the aorta--fever, fatigue, back, chest, or stomach pain, severe headache ?Kidney injury (glomerulonephritis)--decrease in the amount of urine, red or dark brown  urine, foamy or bubbly urine, swelling of the ankles, hands, or feet ?Shortness of breath or trouble breathing ?Spleen injury--pain in upper left stomach or shoulder ?Unusual bruising or bleeding ?Side effects that usually do not require medical attention (report to your care team if they continue or are bothersome): ?Bone pain ?Pain in the hands or feet ?This list may not describe all possible side effects. Call your doctor for medical advice about side effects. You may report side effects to FDA at 1-800-FDA-1088. ?Where should I keep my medication? ?Keep out of the reach of children. ?If you are using this medication at home, you will be instructed on how to store it. Throw away any unused medication after the expiration date on the label. ?NOTE: This sheet is a summary. It may not cover all possible information. If you have questions about this medicine, talk to your doctor, pharmacist, or health care provider. ?? 2023 Elsevier/Gold Standard (2021-02-23 00:00:00) ?Dehydration, Adult ?Dehydration is condition in which there is not enough water or other fluids in the body. This happens when a person loses more fluids than he or she takes in. Important body parts cannot work right without the right amount of fluids. Any loss of fluids from the body can cause dehydration. ?Dehydration can be mild, worse, or very bad. It should be treated right away to keep it from getting very bad. ?What are the causes? ?This condition may be caused by: ?Conditions that cause loss of water or other fluids, such as: ?Watery poop (diarrhea). ?Vomiting. ?Sweating a lot. ?Peeing (urinating) a lot. ?Not drinking enough fluids, especially when you: ?Are ill. ?Are doing things that take a lot of energy to do. ?Other illnesses and conditions, such as fever or infection. ?Certain medicines, such as medicines that take extra fluid out of the body (diuretics). ?Lack of safe drinking water. ?Not being able to get enough water and food. ?What  increases the risk? ?The following factors may make you more likely to develop this condition: ?Having a long-term (chronic) illness that has not been treated the right way, such as: ?Diabetes. ?Heart disease. ?Kidney disease. ?Being 70 years of age or older. ?Having a disability. ?Living in a place that is high above the ground or sea (high in altitude). The thinner, dried air causes more fluid loss. ?Doing exercises that put stress on your body for a long time. ?What are the signs or symptoms? ?Symptoms of dehydration depend on how bad it is. ?Mild or worse dehydration ?Thirst. ?Dry lips or dry mouth. ?Feeling dizzy or light-headed, especially when you stand up from sitting. ?Muscle cramps. ?Your body making: ?Dark pee (urine). Pee may be the color of tea. ?Less pee than normal. ?Less tears than normal. ?Headache. ?Very bad dehydration ?Changes in skin. Skin may: ?Be cold to the touch (clammy). ?Be blotchy or pale. ?Not go back to normal right after you lightly pinch it and let it go. ?Little or no tears, pee, or sweat. ?Changes in vital signs, such as: ?Fast breathing. ?Low blood pressure. ?Weak pulse. ?Pulse that is more than 100 beats a minute when you are sitting still. ?Other changes, such as: ?Feeling very thirsty. ?Eyes that look hollow (sunken). ?Cold hands and feet. ?Being mixed up (confused). ?Being very tired (lethargic)  or having trouble waking from sleep. ?Short-term weight loss. ?Loss of consciousness. ?How is this treated? ?Treatment for this condition depends on how bad it is. Treatment should start right away. Do not wait until your condition gets very bad. Very bad dehydration is an emergency. You will need to go to a hospital. ?Mild or worse dehydration can be treated at home. You may be asked to: ?Drink more fluids. ?Drink an oral rehydration solution (ORS). This drink helps get the right amounts of fluids and salts and minerals in the blood (electrolytes). ?Very bad dehydration can be  treated: ?With fluids through an IV tube. ?By getting normal levels of salts and minerals in your blood. This is often done by giving salts and minerals through a tube. The tube is passed through your nose and

## 2021-07-26 ENCOUNTER — Telehealth: Payer: Self-pay | Admitting: Oncology

## 2021-07-26 ENCOUNTER — Other Ambulatory Visit: Payer: Medicare HMO

## 2021-07-26 ENCOUNTER — Ambulatory Visit: Payer: Self-pay | Admitting: Oncology

## 2021-07-26 DIAGNOSIS — Z170 Estrogen receptor positive status [ER+]: Secondary | ICD-10-CM | POA: Diagnosis not present

## 2021-07-26 DIAGNOSIS — C50411 Malignant neoplasm of upper-outer quadrant of right female breast: Secondary | ICD-10-CM | POA: Diagnosis not present

## 2021-07-26 NOTE — Telephone Encounter (Signed)
07/26/21 Spoke with patient and rescheduled Dexa scan to 08/23/21'@1pm'$ .Patient is in the hosp. ?

## 2021-07-27 ENCOUNTER — Telehealth: Payer: Self-pay | Admitting: Medical Oncology

## 2021-07-27 ENCOUNTER — Telehealth (HOSPITAL_BASED_OUTPATIENT_CLINIC_OR_DEPARTMENT_OTHER): Payer: Self-pay | Admitting: Pulmonary Disease

## 2021-07-27 NOTE — Telephone Encounter (Signed)
RETURN CALL: Voicemail - Detailed Message      SUBJECT:  Appointment Request     REASON FOR VISIT: per referral,   PREFERRED DATE/TIME: ASAP  ADDITIONAL INFORMATION: [facility] unable to schedule due to template not available, per front desk send TE

## 2021-07-27 NOTE — Telephone Encounter (Signed)
[id number] - TREATMENT OF REFRACTORY NAUSEA ? ?Outgoing call: [phone]- Contact call for Cycle 1 ? ?Patient was admitted to the hospital Wednesday evening (07/25/21) for cellulitis of the right breast and a new pulmonary embolism. Per Dr. Hinton Rao, these adverse events are unrelated to the study.  ? ?I spoke with patient, who is still hospitalized, regarding her study questionnaires and her Four-day Home Record. Patient reports not have been able to complete the questionnaires or the Four-day Home Record due to "not feeling too well and in a state of confusion." I inquired with patient if she recalls having any nausea or vomiting, per patient  she doesn't "recalling having any nausea and I didn't vomit." I asked patient if she had taken anything for nausea and she stated she took the antiemetic prescribed, just in case. Per nurse's follow-up call (see phone note on 4/18), the day after first chemo treatment, patient reported intermittent nausea relieved with antiemetic. I informed patient that it appears that she didn't have "moderate nausea" per study requirements to move toward part 2 of study, but that I would contact the study and see how to proceed. Patient gave me her verbal understanding.  ?I informed patient that I will be out of the office and will return May 4th and that research nurse, Jeral Fruit will follow-up with her as to how to proceed. Patient denies any questions at this time and knows to expect call from [place] next week.  ?Return number provided to patient.  ?Patient thanked and encouraged to call in the meantime.  ? ?Adverse Events: Hospitalization for cellulitis of the right breast and a new pulmonary embolism. Per MD, not study related and per study protocol, if unrelated, not reported as an SAE.  ? ?Maxwell Marion, RN, BSN, [place] ?Clinical Research Nurse Lead ?07/27/2021 2:46 PM ? ?

## 2021-07-30 ENCOUNTER — Telehealth: Payer: Self-pay

## 2021-07-30 DIAGNOSIS — C50411 Malignant neoplasm of upper-outer quadrant of right female breast: Secondary | ICD-10-CM | POA: Diagnosis not present

## 2021-07-30 DIAGNOSIS — Z170 Estrogen receptor positive status [ER+]: Secondary | ICD-10-CM | POA: Diagnosis not present

## 2021-07-30 NOTE — Telephone Encounter (Signed)
Research nurse called patient today per request of Rubin Payor, RN, research nurse at [facility] to follow up regarding the protocol directions for this patient moving forward. Patient informed that she won't be eligible for participate in the 2nd part of the protocol since she didn't have significant nausea. The patient verified that she had none really. Patient was encouraged to complete her questionnaires if she could remember her nausea at each time point, and notify Rubin Payor, RN when completed. Also, patient assured if she could not remember well enough to complete them it would be alright with the study. They would still appreciate them completed if at all possible. The patient states she had some more surgery yesterday but is hoping to go home this afternoon. Patient encouraged to call the research department if she has any questions or concerns in the future.  ?Jeral Fruit, RN ?07/30/21 ?3:33 PM ? ?

## 2021-07-31 ENCOUNTER — Other Ambulatory Visit: Payer: Self-pay | Admitting: Hematology and Oncology

## 2021-08-01 ENCOUNTER — Encounter: Payer: Self-pay | Admitting: Medical
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
## 2021-08-02 ENCOUNTER — Inpatient Hospital Stay: Payer: Medicare HMO

## 2021-08-02 ENCOUNTER — Telehealth: Payer: Self-pay

## 2021-08-02 ENCOUNTER — Ambulatory Visit: Payer: Self-pay | Admitting: Oncology

## 2021-08-02 ENCOUNTER — Other Ambulatory Visit: Payer: Self-pay | Admitting: Oncology

## 2021-08-02 ENCOUNTER — Inpatient Hospital Stay (INDEPENDENT_AMBULATORY_CARE_PROVIDER_SITE_OTHER): Payer: Medicare HMO | Admitting: Oncology

## 2021-08-02 ENCOUNTER — Other Ambulatory Visit: Payer: Self-pay | Admitting: Hematology and Oncology

## 2021-08-02 ENCOUNTER — Encounter: Payer: Self-pay | Admitting: Oncology

## 2021-08-02 ENCOUNTER — Other Ambulatory Visit: Payer: Self-pay

## 2021-08-02 VITALS — BP 131/79 | HR 79 | Temp 98.3°F | Resp 20 | Ht <= 58 in | Wt 177.3 lb

## 2021-08-02 DIAGNOSIS — I2699 Other pulmonary embolism without acute cor pulmonale: Secondary | ICD-10-CM | POA: Diagnosis not present

## 2021-08-02 DIAGNOSIS — C773 Secondary and unspecified malignant neoplasm of axilla and upper limb lymph nodes: Secondary | ICD-10-CM

## 2021-08-02 DIAGNOSIS — C50411 Malignant neoplasm of upper-outer quadrant of right female breast: Secondary | ICD-10-CM

## 2021-08-02 DIAGNOSIS — F329 Major depressive disorder, single episode, unspecified: Secondary | ICD-10-CM

## 2021-08-02 DIAGNOSIS — Z170 Estrogen receptor positive status [ER+]: Secondary | ICD-10-CM | POA: Diagnosis not present

## 2021-08-02 LAB — CBC AND DIFFERENTIAL
HCT: 35 — AB (ref 36–46)
Hemoglobin: 11.6 — AB (ref 12.0–16.0)
Neutrophils Absolute: 4.33
Platelets: 149 10*3/uL — AB (ref 150–400)
WBC: 6.1

## 2021-08-02 LAB — BASIC METABOLIC PANEL
BUN: 2 — AB (ref 4–21)
CO2: 24 — AB (ref 13–22)
Chloride: 105 (ref 99–108)
Creatinine: 0.4 — AB (ref 0.5–1.1)
Glucose: 94
Potassium: 3.8 mEq/L (ref 3.5–5.1)
Sodium: 138 (ref 137–147)

## 2021-08-02 LAB — HEPATIC FUNCTION PANEL
ALT: 23 U/L (ref 7–35)
AST: 44 — AB (ref 13–35)
Alkaline Phosphatase: 110 (ref 25–125)
Bilirubin, Total: 0.3

## 2021-08-02 LAB — CBC
MCV: 97 (ref 81–99)
RBC: 3.61 — AB (ref 3.87–5.11)

## 2021-08-02 LAB — COMPREHENSIVE METABOLIC PANEL
Albumin: 3.3 — AB (ref 3.5–5.0)
Calcium: 8.2 — AB (ref 8.7–10.7)

## 2021-08-02 LAB — CORRECTED CALCIUM (CC13): Calcium, Corrected: 8.9

## 2021-08-02 LAB — PROTEIN, TOTAL: Total Protein: 6 g/dL — AB (ref 6.3–8.2)

## 2021-08-02 MED ORDER — FLUOXETINE HCL 60 MG PO TABS: 60.0000 mg | ORAL_TABLET | Freq: Every day | ORAL | 5 refills | Status: AC

## 2021-08-02 NOTE — Telephone Encounter (Signed)
Patient rescheduled to May 16th, 20203 ? ?

## 2021-08-02 NOTE — Telephone Encounter (Signed)
-----   Message from [name], MD sent at 08/02/2021 10:50 AM EDT ----- ?Regarding: DEXA ?Was her DEXA scheduled? ? ?

## 2021-08-03 ENCOUNTER — Encounter: Payer: Self-pay | Admitting: Oncology

## 2021-08-06 ENCOUNTER — Ambulatory Visit: Payer: Self-pay

## 2021-08-08 ENCOUNTER — Encounter: Payer: Self-pay | Admitting: Oncology

## 2021-08-08 ENCOUNTER — Ambulatory Visit: Payer: Self-pay
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
## 2021-08-09 ENCOUNTER — Inpatient Hospital Stay: Payer: Medicare HMO

## 2021-08-09 ENCOUNTER — Telehealth: Payer: Self-pay | Admitting: Oncology

## 2021-08-09 ENCOUNTER — Other Ambulatory Visit: Payer: Self-pay | Admitting: Pharmacist

## 2021-08-09 ENCOUNTER — Inpatient Hospital Stay: Payer: Medicare HMO | Attending: Oncology | Admitting: Oncology

## 2021-08-09 ENCOUNTER — Other Ambulatory Visit: Payer: Self-pay | Admitting: Hematology and Oncology

## 2021-08-09 VITALS — BP 134/87 | HR 80 | Temp 98.5°F | Resp 18 | Ht <= 58 in | Wt 172.7 lb

## 2021-08-09 DIAGNOSIS — C773 Secondary and unspecified malignant neoplasm of axilla and upper limb lymph nodes: Secondary | ICD-10-CM

## 2021-08-09 DIAGNOSIS — C50411 Malignant neoplasm of upper-outer quadrant of right female breast: Secondary | ICD-10-CM

## 2021-08-09 DIAGNOSIS — Z170 Estrogen receptor positive status [ER+]: Secondary | ICD-10-CM | POA: Diagnosis not present

## 2021-08-09 DIAGNOSIS — E876 Hypokalemia: Secondary | ICD-10-CM | POA: Insufficient documentation

## 2021-08-09 LAB — CBC AND DIFFERENTIAL
HCT: 37 (ref 36–46)
Hemoglobin: 12.1 (ref 12.0–16.0)
Neutrophils Absolute: 8.49
Platelets: 297 10*3/uL (ref 150–400)
WBC: 12.3

## 2021-08-09 LAB — BASIC METABOLIC PANEL
BUN: 3 — AB (ref 4–21)
CO2: 27 — AB (ref 13–22)
Chloride: 99 (ref 99–108)
Creatinine: 0.5 (ref 0.5–1.1)
Glucose: 103
Potassium: 3 mEq/L — AB (ref 3.5–5.1)
Sodium: 134 — AB (ref 137–147)

## 2021-08-09 LAB — HEPATIC FUNCTION PANEL
ALT: 23 U/L (ref 7–35)
AST: 53 — AB (ref 13–35)
Alkaline Phosphatase: 150 — AB (ref 25–125)
Bilirubin, Total: 0.7

## 2021-08-09 LAB — PROTEIN, TOTAL: Total Protein: 5.9 g/dL — AB (ref 6.3–8.2)

## 2021-08-09 LAB — CBC
MCV: 96 (ref 81–99)
RBC: 3.85 — AB (ref 3.87–5.11)

## 2021-08-09 LAB — CORRECTED CALCIUM (CC13): Calcium, Corrected: 8.8

## 2021-08-09 LAB — COMPREHENSIVE METABOLIC PANEL
Albumin: 3.1 — AB (ref 3.5–5.0)
Calcium: 7.9 — AB (ref 8.7–10.7)

## 2021-08-09 NOTE — Progress Notes (Signed)
Face to visit with pt in [place]. Assisted pt with wig selection. Pt has been hospitalized recently from a wound infection and treatments are postponed until healing completes.  ?

## 2021-08-09 NOTE — Telephone Encounter (Signed)
Per 08/09/21 los next appt scheduled and confirmed with patient ?

## 2021-08-10 ENCOUNTER — Encounter: Payer: Self-pay | Admitting: Oncology

## 2021-08-10 ENCOUNTER — Encounter: Payer: Self-pay | Admitting: Genetic Counselor

## 2021-08-10 ENCOUNTER — Inpatient Hospital Stay (INDEPENDENT_AMBULATORY_CARE_PROVIDER_SITE_OTHER): Payer: Medicare HMO | Admitting: Genetic Counselor

## 2021-08-10 ENCOUNTER — Other Ambulatory Visit: Payer: Self-pay | Admitting: Genetic Counselor

## 2021-08-10 ENCOUNTER — Inpatient Hospital Stay: Payer: Medicare HMO

## 2021-08-10 VITALS — BP 119/76 | HR 86 | Temp 98.0°F | Resp 18

## 2021-08-10 DIAGNOSIS — Z803 Family history of malignant neoplasm of breast: Secondary | ICD-10-CM

## 2021-08-10 DIAGNOSIS — Z170 Estrogen receptor positive status [ER+]: Secondary | ICD-10-CM | POA: Diagnosis not present

## 2021-08-10 DIAGNOSIS — C50411 Malignant neoplasm of upper-outer quadrant of right female breast: Secondary | ICD-10-CM

## 2021-08-10 DIAGNOSIS — E876 Hypokalemia: Secondary | ICD-10-CM

## 2021-08-10 HISTORY — DX: Family history of malignant neoplasm of breast: Z80.3

## 2021-08-10 MED ORDER — HEPARIN SOD (PORK) LOCK FLUSH 100 UNIT/ML IV SOLN
500.0000 [IU] | Freq: Once | INTRAVENOUS | Status: AC | PRN
  Administered 2021-08-10: 500 [IU]

## 2021-08-10 MED ORDER — POTASSIUM CHLORIDE 10 MEQ/100ML IV SOLN
10.0000 meq | INTRAVENOUS | Status: DC
  Administered 2021-08-10: 10 meq via INTRAVENOUS
  Filled 2021-08-10: qty 100

## 2021-08-10 MED ORDER — POTASSIUM CHLORIDE 10 MEQ/100ML IV SOLN: 10.0000 meq | Freq: Once | INTRAVENOUS | Status: DC

## 2021-08-10 MED ORDER — SODIUM CHLORIDE 0.9 % IV SOLN: Freq: Once | INTRAVENOUS | Status: DC

## 2021-08-10 MED ORDER — SODIUM CHLORIDE 0.9% FLUSH
10.0000 mL | Freq: Once | INTRAVENOUS | Status: AC | PRN
  Administered 2021-08-10: 10 mL

## 2021-08-10 MED ORDER — SODIUM CHLORIDE 0.9 % IV SOLN: Freq: Once | INTRAVENOUS | Status: AC

## 2021-08-10 NOTE — Patient Instructions (Signed)
Potassium Acetate Injection What is this medication? POTASSIUM ACETATE (poe TASS i um ASa tate) prevents and treats low levels of potassium in your body. Potassium plays an important role in maintaining the health of your kidneys, heart, muscles, and nervous system. This medicine may be used for other purposes; ask your health care provider or pharmacist if you have questions. What should I tell my care team before I take this medication? They need to know if you have any of these conditions: Addison's disease Dehydration Diabetes Heart disease High levels of potassium in the blood Irregular heartbeat Kidney disease Liver disease Recent severe burn An unusual or allergic reaction to potassium, other medications, foods, dyes, or preservatives Pregnant or trying to get pregnant Breast-feeding How should I use this medication? This medication is for infusion into a vein. It is given in a hospital or clinic setting. Talk to your care team about the use of this medication in children. Special care may be needed. Overdosage: If you think you have taken too much of this medicine contact a poison control center or emergency room at once. NOTE: This medicine is only for you. Do not share this medicine with others. What if I miss a dose? This does not apply. What may interact with this medication? Do not take this medication with any of the following: Certain diuretics such as spironolactone, triamterene Eplerenone Sodium polystyrene sulfonate This medication may also interact with the following: Certain medications for blood pressure or heart disease like lisinopril, losartan, quinapril, valsartan Medications that lower your chance of fighting infection such as cyclosporine, tacrolimus NSAIDs, medications for pain and inflammation, like ibuprofen or naproxen Other potassium supplements Salt substitutes This list may not describe all possible interactions. Give your health care provider a  list of all the medicines, herbs, non-prescription drugs, or dietary supplements you use. Also tell them if you smoke, drink alcohol, or use illegal drugs. Some items may interact with your medicine. What should I watch for while using this medication? Your condition will be monitored carefully while you are receiving this medication. You may need blood work done while you are taking this medication. What side effects may I notice from receiving this medication? Side effects that you should report to your care team as soon as possible: Allergic reactions--skin rash, itching, hives, swelling of the face, lips, tongue, or throat High potassium level--muscle weakness, fast or irregular heartbeat Side effects that usually do not require medical attention (report these to your care team if they continue or are bothersome): Pain, redness, or irritation at injection site This list may not describe all possible side effects. Call your doctor for medical advice about side effects. You may report side effects to FDA at 1-800-FDA-1088. Where should I keep my medication? This medication is given in a hospital or clinic and will not be stored at home. NOTE: This sheet is a summary. It may not cover all possible information. If you have questions about this medicine, talk to your doctor, pharmacist, or health care provider.  2023 Elsevier/Gold Standard (2020-11-30 00:00:00)  

## 2021-08-10 NOTE — Progress Notes (Signed)
Patient arrived late for infusion appointment and needed to leave at 12:30 for another appointment. ?Received Potassium Chloride 10 Meq IV (instead of the ordered 30 Meq) due to time constraints. ? ?Mallory Ayers, [place], BCPS, BCOP ?08/10/2021 ?1:02 PM ? ?
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
## 2021-08-13 ENCOUNTER — Ambulatory Visit: Payer: Medicare HMO

## 2021-08-14 ENCOUNTER — Other Ambulatory Visit: Payer: Medicare HMO

## 2021-08-15 ENCOUNTER — Telehealth: Payer: Self-pay | Admitting: Medical Oncology

## 2021-08-15 ENCOUNTER — Ambulatory Visit: Payer: Medicare HMO

## 2021-08-15 NOTE — Telephone Encounter (Signed)
[id number] - TREATMENT OF REFRACTORY NAUSEA ? ?Outgoing Call ?I spoke with patient to follow up with her on the [employer] Record I provided to her at the start of Cycle 1. Patient stated she has not had a chance to complete these forms. I reaffirmed to patient to only complete the home record if she thinks she can remember what her nausea status was at the time, patient gave verbal understanding. Informed patient that study appreciates any information they receive but if she cannot remember then not for her to worry about completion. I also inquired with patient if she had received an email from study for the link to complete the study questionnaires. Patient states she may have, but has not had time to check. Patient states that she has been busy and has had "a lot going on." States her son's graduation was this past weekend. Patient states she will try to complete these forms next week. I inquired with her if she had any questions and she denied having any, at this time. I thanked patient for her time today and encouraged her to call should she have any questions/concerns related to the study.  ? ?Maxwell Marion, RN, BSN, [place] ?Clinical Research Nurse Lead ?08/15/2021 2:58 PM ? ?

## 2021-08-15 NOTE — Telephone Encounter (Signed)
DCP-001: Use of a Clinical Trial Screening Tool to Address Cancer Health Disparities in the [facility]) ? ? ?Outgoing call ? ?Follow-up call to patient regarding DCP-001 consent form information provided to her on April 17th, 23. Inquired with patient if she's had a chance to review the information provided to her. Patient stated she has not reviewed it. I provided patient with a explanation and purpose of the study and the voluntary nature of it. Patient states she has "a lot going on right now" and does not have the time and prefers not to participate. I gave patient my understanding and thanked her for her time and encouraged her to call me, should she have any questions related to the study.  ?Patient has my contact information.  ? ?Maxwell Marion, RN, BSN, [place] ?Clinical Research Nurse Lead ?08/15/2021 3:10 PM ? ? ?
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
## 2021-08-24 ENCOUNTER — Encounter: Payer: Self-pay | Admitting: Oncology

## 2021-08-24 ENCOUNTER — Inpatient Hospital Stay: Payer: Medicare HMO

## 2021-08-24 ENCOUNTER — Inpatient Hospital Stay (INDEPENDENT_AMBULATORY_CARE_PROVIDER_SITE_OTHER): Payer: Medicare HMO | Admitting: Oncology

## 2021-08-24 VITALS — BP 141/89 | HR 81 | Temp 97.9°F | Resp 18 | Ht <= 58 in | Wt 171.4 lb

## 2021-08-24 DIAGNOSIS — C50411 Malignant neoplasm of upper-outer quadrant of right female breast: Secondary | ICD-10-CM | POA: Diagnosis not present

## 2021-08-24 DIAGNOSIS — Z170 Estrogen receptor positive status [ER+]: Secondary | ICD-10-CM

## 2021-08-24 DIAGNOSIS — I2699 Other pulmonary embolism without acute cor pulmonale: Secondary | ICD-10-CM | POA: Diagnosis not present

## 2021-08-24 DIAGNOSIS — C773 Secondary and unspecified malignant neoplasm of axilla and upper limb lymph nodes: Secondary | ICD-10-CM

## 2021-08-24 LAB — CBC: RBC: 4.24 (ref 3.87–5.11)

## 2021-08-24 LAB — HEPATIC FUNCTION PANEL
ALT: 97 U/L — AB (ref 7–35)
AST: 238 — AB (ref 13–35)
Alkaline Phosphatase: 230 — AB (ref 25–125)
Bilirubin, Total: 0.7

## 2021-08-24 LAB — BASIC METABOLIC PANEL
BUN: 2 — AB (ref 4–21)
CO2: 33 — AB (ref 13–22)
Chloride: 98 — AB (ref 99–108)
Creatinine: 0.5 (ref 0.5–1.1)
Glucose: 97
Potassium: 4 mEq/L (ref 3.5–5.1)
Sodium: 137 (ref 137–147)

## 2021-08-24 LAB — CBC AND DIFFERENTIAL
HCT: 42 (ref 36–46)
Hemoglobin: 13.6 (ref 12.0–16.0)
Neutrophils Absolute: 5.11
Platelets: 180 10*3/uL (ref 150–400)
WBC: 7.3

## 2021-08-24 LAB — COMPREHENSIVE METABOLIC PANEL
Albumin: 3.4 — AB (ref 3.5–5.0)
Calcium: 8.4 — AB (ref 8.7–10.7)

## 2021-08-24 NOTE — Progress Notes (Signed)
Face to face contact with pt in [place]. Pt is here to follow up with Med Onc. Pt's chemo has been on hold waiting on wound healing . Pt had questions today regardind prosthesis. Information given regarding [employer].
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
## 2021-08-27 ENCOUNTER — Other Ambulatory Visit: Payer: Self-pay | Admitting: Hematology and Oncology

## 2021-08-27 ENCOUNTER — Other Ambulatory Visit: Payer: Self-pay | Admitting: Oncology

## 2021-08-27 ENCOUNTER — Telehealth: Payer: Self-pay

## 2021-08-27 DIAGNOSIS — C801 Malignant (primary) neoplasm, unspecified: Secondary | ICD-10-CM

## 2021-08-27 MED ORDER — LORAZEPAM 1 MG PO TABS: 1.0000 mg | ORAL_TABLET | Freq: Three times a day (TID) | ORAL | 0 refills | Status: DC | PRN

## 2021-08-27 NOTE — Telephone Encounter (Signed)
Talked with Manuela Schwartz at Dr. Venita Sheffield office. She has the patient in to see Dr. Noberto Retort on Tuesday Aug 28, 2021 at 0815 for wound check.  Called patient and left a detailed message regarding appointment with Dr. Noberto Retort.

## 2021-08-27 NOTE — Telephone Encounter (Signed)
-----   Message from [name], MD sent at 08/24/2021  1:58 PM EDT ----- Regarding: refer Pls ask Dr. Noberto Retort to check her wound to see if anything else can be done.  Holding chemo

## 2021-09-07 ENCOUNTER — Other Ambulatory Visit: Payer: Self-pay

## 2021-09-12 ENCOUNTER — Inpatient Hospital Stay: Payer: Medicare HMO

## 2021-09-12 ENCOUNTER — Inpatient Hospital Stay: Payer: Medicare HMO | Attending: Oncology | Admitting: Hematology and Oncology

## 2021-09-12 ENCOUNTER — Encounter: Payer: Self-pay | Admitting: Hematology and Oncology

## 2021-09-12 VITALS — BP 132/84 | HR 87 | Temp 98.0°F | Resp 20 | Ht <= 58 in | Wt 164.5 lb

## 2021-09-12 DIAGNOSIS — R7401 Elevation of levels of liver transaminase levels: Secondary | ICD-10-CM | POA: Diagnosis not present

## 2021-09-12 DIAGNOSIS — R197 Diarrhea, unspecified: Secondary | ICD-10-CM

## 2021-09-12 DIAGNOSIS — Z170 Estrogen receptor positive status [ER+]: Secondary | ICD-10-CM

## 2021-09-12 DIAGNOSIS — F32A Depression, unspecified: Secondary | ICD-10-CM

## 2021-09-12 DIAGNOSIS — Z5111 Encounter for antineoplastic chemotherapy: Secondary | ICD-10-CM | POA: Diagnosis not present

## 2021-09-12 DIAGNOSIS — I2699 Other pulmonary embolism without acute cor pulmonale: Secondary | ICD-10-CM

## 2021-09-12 DIAGNOSIS — C50411 Malignant neoplasm of upper-outer quadrant of right female breast: Secondary | ICD-10-CM

## 2021-09-12 DIAGNOSIS — Z803 Family history of malignant neoplasm of breast: Secondary | ICD-10-CM

## 2021-09-12 DIAGNOSIS — Z5189 Encounter for other specified aftercare: Secondary | ICD-10-CM | POA: Diagnosis not present

## 2021-09-12 DIAGNOSIS — F419 Anxiety disorder, unspecified: Secondary | ICD-10-CM

## 2021-09-12 DIAGNOSIS — R748 Abnormal levels of other serum enzymes: Secondary | ICD-10-CM

## 2021-09-12 DIAGNOSIS — R630 Anorexia: Secondary | ICD-10-CM

## 2021-09-12 LAB — HEPATITIS PANEL, ACUTE
HCV Ab: NONREACTIVE
Hep A IgM: NONREACTIVE
Hep B C IgM: NONREACTIVE
Hepatitis B Surface Ag: NONREACTIVE

## 2021-09-12 LAB — CBC AND DIFFERENTIAL
HCT: 43 (ref 36–46)
Hemoglobin: 14.2 (ref 12.0–16.0)
Neutrophils Absolute: 5.55
Platelets: 228 10*3/uL (ref 150–400)
WBC: 7.4

## 2021-09-12 LAB — COMPREHENSIVE METABOLIC PANEL
Albumin: 3.4 — AB (ref 3.5–5.0)
Calcium: 8.5 — AB (ref 8.7–10.7)

## 2021-09-12 LAB — BASIC METABOLIC PANEL
BUN: 5 (ref 4–21)
CO2: 23 — AB (ref 13–22)
Chloride: 100 (ref 99–108)
Creatinine: 0.6 (ref 0.5–1.1)
Glucose: 110
Potassium: 3.5 mEq/L (ref 3.5–5.1)
Sodium: 137 (ref 137–147)

## 2021-09-12 LAB — HEPATIC FUNCTION PANEL
ALT: 109 U/L — AB (ref 7–35)
AST: 247 — AB (ref 13–35)
Alkaline Phosphatase: 264 — AB (ref 25–125)
Bilirubin, Total: 1

## 2021-09-12 LAB — CBC: RBC: 4.55 (ref 3.87–5.11)

## 2021-09-12 NOTE — Assessment & Plan Note (Signed)
New onset significant elevation of the liver transaminases and alkaline phosphatase. Apparently she has had this in the past as it was documented in her chart.  She is having increased diarrhea.  I will obtain an acute hepatitis panel today.  She also has hepatic steatosis, which was notated on previous CT imaging in 2020.  She is drinking 1 glass of wine daily.  This also may be an effect of medications.  Both Prozac and Eliquis can cause elevation of the transaminases in less than 10% of patients.  I asked her to abstain from alcohol for the time being.  We will plan to repeat her hepatic panel early next week.  If she does not have improvement in her transaminases, we may need to consider imaging to evaluate for hepatic metastasis.

## 2021-09-12 NOTE — Assessment & Plan Note (Signed)
She reports chronic diarrhea since her cholecystectomy.  The diarrhea has increased with about 10 stools per day.  We will obtain stool studies to evaluate for infectious cause.  If she has an infectious cause, this could affect her transaminases.

## 2021-09-12 NOTE — Assessment & Plan Note (Signed)
She has had genetic counseling and we are awaiting the results of her genetic testing.

## 2021-09-12 NOTE — Assessment & Plan Note (Signed)
She is doing better with the increase in the Prozac to 60 mg daily.  She is usually using Ativan 1 mg 3 times a day.  I advised her she could increase this to 4 times a day as needed for anxiety.

## 2021-09-12 NOTE — Assessment & Plan Note (Signed)
Decreased appetite, chronic nausea and now weight loss likely due to gastric sleeve.  We discussed some strategies for maintaining her calorie intake.  I will also ask our dietitian to meet with her.

## 2021-09-12 NOTE — Assessment & Plan Note (Addendum)
History of recurrent pulmonary emboli.  Her first episode occurred after trauma in January 2009.  She had a second episode of pulmonary emboli in February 2019.  She was admitted in January 2020 and underwent testing for a hypercoagulable disorder.  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  She was on Eliquis 5 mg twice daily when we saw her for consultation.  She has now had a 3rd single segmental pulmonary embolism to the right upper lobe in April.  We increased her dose of Eliquis to 10 mg twice daily due to the recurrent pulmonary embolism.  She has had increased bruising, but denies bleeding.  She denies symptoms concerning for progressive pulmonary embolism.

## 2021-09-12 NOTE — Assessment & Plan Note (Signed)
Stage IB (T1 cN1 M0) hormone receptor positive right breast cancer.  Treated with mastectomy.  Endopredict Epclin score is 4.1 which is considered high risk correlating with a 20% risk of recurrence in the next 10 years with hormonal therapy alone.  She would have an 8% benefit from chemotherapy, so adjuvant chemotherapy with Adriamycin/cyclophosphamide for 4 cycles followed by weekly paclitaxel for 12 doses was recommended.  She had her first dose of AC on April 17 and was admitted to the hospital April 20 with an infection of her right mastectomy.  She underwent drainage of the seroma, but has had slow healing, so chemotherapy has been delayed to avoid affecting her ability to heal and recurrent infection.  We wound is nearly healed, but is still being packed, so we would recommend waiting 1 to 2 weeks before resuming chemotherapy.
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
## 2021-09-13 ENCOUNTER — Other Ambulatory Visit: Payer: Self-pay | Admitting: Hematology and Oncology

## 2021-09-13 ENCOUNTER — Telehealth: Payer: Self-pay | Admitting: Dietician

## 2021-09-13 ENCOUNTER — Encounter: Payer: Self-pay | Admitting: Oncology

## 2021-09-13 DIAGNOSIS — R197 Diarrhea, unspecified: Secondary | ICD-10-CM

## 2021-09-13 MED ORDER — CIPROFLOXACIN HCL 500 MG PO TABS: 500.0000 mg | ORAL_TABLET | Freq: Two times a day (BID) | ORAL | 0 refills | Status: DC

## 2021-09-13 NOTE — Telephone Encounter (Signed)
Nutrition Assessment  Reason for Assessment: Referral from PA-C.   ASSESSMENT: Patient is 51 year old female with stage IB (T1 cN1 M0) hormone receptor positive right breast cancer. PMHx includes DM2, mastectomy, bilateral AKA, COPD, PE, fatty liver, B12 deficiency anemia.   Patient reports she has been unable to take much PO fluids only water.  Diarrhea 10+ watery stools, per day, nausea and overall poor appetite.  Nutrition Focused Physical Exam:  Unable to perform NFPE    Medications: Not currently using any anti diarrheal meds stool being evaluated for infectious cause   Labs: 09/12/21  AST 247 ALT 109   Anthropometrics:   Height: 46" (bilat amputee) Weight:   09/12/21   164.5# 08/24/21  171..4#  UBW: declining since gastric sleeve 2015 BMI: n/a AKA   Estimated Energy Needs  Kcals: 3295-1884 Protein: 75-90 Fluid: > 3 L  INTERVENTION: Reviewed strategies for diarrhea management.  Encouraged alternating water wit electrolyte replacement fluid.  Reviewed BRAT diet and encouraged alternating BRAT bites with bland protein source and trial small feeds/fluids each hour.  Goal 100 oz to replace fluid losses. Emailed tips for diarrhea and nausea with contact information.   MONITORING, EVALUATION, GOAL: PO intake, weight, nutrition impact symptoms   Next Visit: Telephone follow up 09/18/21  April Manson, RDN, LDN Registered Dietitian, [place] Part Time Remote (Usual office hours: Tuesday-Thursday) Remote Office: [phone]

## 2021-09-13 NOTE — Telephone Encounter (Signed)
Patient referred by K. Mosher for weight loss and poor appetite. First attempt to reach. Provided my cell# on voice mail and also sent text to return call or provide good time set up a nutrition consult.  April Manson, RDN, LDN Registered Dietitian, [place] Part Time Remote (Usual office hours: Tuesday-Thursday) Cell: [phone]

## 2021-09-14 ENCOUNTER — Encounter: Payer: Self-pay | Admitting: Oncology

## 2021-09-17 ENCOUNTER — Inpatient Hospital Stay: Payer: Medicare HMO

## 2021-09-17 ENCOUNTER — Other Ambulatory Visit: Payer: Self-pay | Admitting: Hematology and Oncology

## 2021-09-17 DIAGNOSIS — R7401 Elevation of levels of liver transaminase levels: Secondary | ICD-10-CM

## 2021-09-17 LAB — BASIC METABOLIC PANEL
BUN: 2 — AB (ref 4–21)
CO2: 26 — AB (ref 13–22)
Chloride: 101 (ref 99–108)
Creatinine: 0.6 (ref 0.5–1.1)
Glucose: 93
Potassium: 4.2 mEq/L (ref 3.5–5.1)
Sodium: 137 (ref 137–147)

## 2021-09-17 LAB — CBC AND DIFFERENTIAL
HCT: 42 (ref 36–46)
Hemoglobin: 13.6 (ref 12.0–16.0)
Neutrophils Absolute: 5.46
Platelets: 172 10*3/uL (ref 150–400)
WBC: 7

## 2021-09-17 LAB — HEPATIC FUNCTION PANEL
ALT: 83 U/L — AB (ref 7–35)
AST: 200 — AB (ref 13–35)
Alkaline Phosphatase: 247 — AB (ref 25–125)
Bilirubin, Total: 0.9

## 2021-09-17 LAB — CBC: RBC: 4.3 (ref 3.87–5.11)

## 2021-09-17 LAB — COMPREHENSIVE METABOLIC PANEL
Albumin: 3.3 — AB (ref 3.5–5.0)
Calcium: 8.3 — AB (ref 8.7–10.7)

## 2021-09-17 NOTE — Progress Notes (Signed)
Face to face visit with pt who is at [facility] for [facility]. Pt 's chemo treatments have been on hold due to problems with would healing. Pt reports that she is feeling well and will follow up with the surgeon tomorrow. Pt reports that she has been to the beach and out cleaning the pool this weekend,

## 2021-09-18 ENCOUNTER — Inpatient Hospital Stay: Payer: Medicare HMO | Admitting: Dietician

## 2021-09-18 NOTE — Progress Notes (Signed)
Nutrition Follow-up:  Called patient at her home telephone# which is the same as her mobile.  She reports her diarrhea has improved, not completely resolved but much better.  Medications: Cipro added 6/8  Labs: 09/17/21  Na, K+ WNL, Bun 2  Anthropometrics:  no new weight  Estimated Energy Needs   Kcals: 5072-2575 Protein: 75-90 Fluid: > 3 L   INTERVENTION: Encourage continued increase in fluids and suggested she use recipe for home made OJ electrolyte replacement.  Relayed information about Juven for wound healing and suggested she consider use for Vit C 500 mg BID for wounds. Emailed screen shot of Juven with link, and offered to provide samples of Banatrol when I return on 09/25/21 with contact information.     MONITORING, EVALUATION, GOAL: PO intake, weight, nutrition impact symptoms     Next Visit: PRN at patient or provider request   April Manson, RDN, LDN Registered Dietitian, [place] Part Time Remote (Usual office hours: Tuesday-Thursday) Remote Office: [phone]

## 2021-09-24 ENCOUNTER — Other Ambulatory Visit: Payer: Self-pay | Admitting: Hematology and Oncology

## 2021-09-24 ENCOUNTER — Encounter: Payer: Self-pay | Admitting: Hematology and Oncology

## 2021-09-24 ENCOUNTER — Inpatient Hospital Stay (INDEPENDENT_AMBULATORY_CARE_PROVIDER_SITE_OTHER): Payer: Medicare HMO | Admitting: Hematology and Oncology

## 2021-09-24 ENCOUNTER — Inpatient Hospital Stay: Payer: Medicare HMO

## 2021-09-24 ENCOUNTER — Other Ambulatory Visit: Payer: Self-pay

## 2021-09-24 VITALS — BP 154/86 | HR 68 | Temp 97.9°F | Resp 18 | Ht <= 58 in | Wt 166.8 lb

## 2021-09-24 DIAGNOSIS — R7401 Elevation of levels of liver transaminase levels: Secondary | ICD-10-CM

## 2021-09-24 DIAGNOSIS — Z170 Estrogen receptor positive status [ER+]: Secondary | ICD-10-CM

## 2021-09-24 DIAGNOSIS — C50411 Malignant neoplasm of upper-outer quadrant of right female breast: Secondary | ICD-10-CM | POA: Diagnosis not present

## 2021-09-24 DIAGNOSIS — R932 Abnormal findings on diagnostic imaging of liver and biliary tract: Secondary | ICD-10-CM | POA: Diagnosis not present

## 2021-09-24 LAB — CBC AND DIFFERENTIAL
HCT: 41 (ref 36–46)
Hemoglobin: 13.5 (ref 12.0–16.0)
Neutrophils Absolute: 4.76
Platelets: 185 10*3/uL (ref 150–400)
WBC: 7.1

## 2021-09-24 LAB — HEPATIC FUNCTION PANEL
ALT: 47 U/L — AB (ref 7–35)
AST: 122 — AB (ref 13–35)
Alkaline Phosphatase: 182 — AB (ref 25–125)
Bilirubin, Total: 0.8

## 2021-09-24 LAB — BASIC METABOLIC PANEL
BUN: 3 — AB (ref 4–21)
CO2: 29 — AB (ref 13–22)
Chloride: 100 (ref 99–108)
Creatinine: 0.8 (ref 0.5–1.1)
Glucose: 103
Potassium: 3.4 mEq/L — AB (ref 3.5–5.1)
Sodium: 138 (ref 137–147)

## 2021-09-24 LAB — CBC
MCV: 97 (ref 81–99)
RBC: 4.17 (ref 3.87–5.11)

## 2021-09-24 LAB — COMPREHENSIVE METABOLIC PANEL
Albumin: 3.3 — AB (ref 3.5–5.0)
Calcium: 8.2 — AB (ref 8.7–10.7)

## 2021-09-24 LAB — CORRECTED CALCIUM (CC13): Calcium, Corrected: 8.9

## 2021-09-24 MED ORDER — LORAZEPAM 1 MG PO TABS: 1.0000 mg | ORAL_TABLET | Freq: Four times a day (QID) | ORAL | 0 refills | Status: DC | PRN

## 2021-09-24 NOTE — Assessment & Plan Note (Addendum)
Stage IB (T1 cN1 M0) hormone receptor positive right breast cancer.  Treated with mastectomy.  Endopredict Epclin score is 4.1 which is considered high risk correlating with a 20% risk of recurrence in the next 10 years with hormonal therapy alone. She would have an 8% benefit from chemotherapy, so adjuvant chemotherapy with Adriamycin/cyclophosphamide for 4 cycles followed by weekly paclitaxel for 12 doses was recommended.  She had her first dose of AC on April 17 and was admitted to the hospital April 20 with an infection of her right mastectomy.  She underwent drainage of the seroma, but has had slow healing, so chemotherapy has been delayed to avoid affecting her ability to heal and recurrent infection.  Her wound has healed. We now are holding off on therapy in order to investigate the abnormality seen in the liver.There is also a new wedge deformity of the T9 vertebral body, this is most likely due to her recent fall.

## 2021-09-24 NOTE — Assessment & Plan Note (Addendum)
CT abdomen/pelvis reveals somewhat amorphous hyperenhancing lesion with overlying capsular retraction centered in the anterior right lobe of the liver, hepatic segments V/VIII, measuring 7.3 x 3.1 cm. This appearance has evolved over multiple sequential prior examinations dating back to 01/06/2020 but is similar in appearance to most recent examination dated 06/26/2021. This unusual lesion is of uncertain significance but worrisome in the setting of referable pain and laboratory abnormalities. Capsular retraction is generally concerning for malignancy such as cholangiocarcinoma, however apparent enhancement characteristics are not typical. Morphology is likewise not typical for metastasis, although this is a definite differential consideration in the setting of known breast malignancy.  Recommend contrast enhanced MRI of the liver to further evaluate.  This lesion was not mentioned on previous CT reports.  I will get her scheduled for an MRI abdomen with contrast as soon as possible and plan to see her back once that is done.
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
## 2021-09-24 NOTE — Assessment & Plan Note (Signed)
Slowly improving. Initially, felt to be due to acute illness, perhaps worsened due to hepatic steatosis.  CT imaging was done for further evaluation and revealed an abnormality in the liver of uncertain etiology. MRI abdomen is recommended.

## 2021-09-25 ENCOUNTER — Other Ambulatory Visit: Payer: Self-pay | Admitting: Hematology and Oncology

## 2021-09-25 DIAGNOSIS — R7401 Elevation of levels of liver transaminase levels: Secondary | ICD-10-CM

## 2021-09-25 DIAGNOSIS — K769 Liver disease, unspecified: Secondary | ICD-10-CM

## 2021-09-25 DIAGNOSIS — Z170 Estrogen receptor positive status [ER+]: Secondary | ICD-10-CM

## 2021-09-25 DIAGNOSIS — C50411 Malignant neoplasm of upper-outer quadrant of right female breast: Secondary | ICD-10-CM

## 2021-09-25 DIAGNOSIS — R932 Abnormal findings on diagnostic imaging of liver and biliary tract: Secondary | ICD-10-CM

## 2021-09-28 ENCOUNTER — Other Ambulatory Visit (HOSPITAL_COMMUNITY): Payer: Self-pay | Admitting: Hematology and Oncology

## 2021-09-28 ENCOUNTER — Ambulatory Visit (HOSPITAL_COMMUNITY): Payer: Medicare HMO

## 2021-09-28 DIAGNOSIS — R7401 Elevation of levels of liver transaminase levels: Secondary | ICD-10-CM

## 2021-09-28 DIAGNOSIS — R932 Abnormal findings on diagnostic imaging of liver and biliary tract: Secondary | ICD-10-CM

## 2021-10-01 ENCOUNTER — Other Ambulatory Visit: Payer: Self-pay | Admitting: Oncology

## 2021-10-01 ENCOUNTER — Other Ambulatory Visit (HOSPITAL_COMMUNITY): Payer: Self-pay | Admitting: Hematology and Oncology

## 2021-10-01 ENCOUNTER — Ambulatory Visit (HOSPITAL_COMMUNITY): Admission: RE | Admit: 2021-10-01 | Payer: Medicare HMO | Source: Ambulatory Visit

## 2021-10-01 ENCOUNTER — Ambulatory Visit (HOSPITAL_COMMUNITY): Payer: Medicare HMO

## 2021-10-01 ENCOUNTER — Ambulatory Visit (HOSPITAL_COMMUNITY)
Admission: RE | Admit: 2021-10-01 | Discharge: 2021-10-01 | Disposition: A | Discharge status: Home / Self Care | Payer: Medicare HMO | Source: Ambulatory Visit | Attending: Oncology | Admitting: Oncology

## 2021-10-01 DIAGNOSIS — K769 Liver disease, unspecified: Secondary | ICD-10-CM | POA: Insufficient documentation

## 2021-10-01 DIAGNOSIS — R7401 Elevation of levels of liver transaminase levels: Secondary | ICD-10-CM

## 2021-10-01 DIAGNOSIS — R932 Abnormal findings on diagnostic imaging of liver and biliary tract: Secondary | ICD-10-CM

## 2021-10-01 MED ORDER — GADOBUTROL 1 MMOL/ML IV SOLN
8.0000 mL | Freq: Once | INTRAVENOUS | Status: AC | PRN
Start: 2021-10-01 — End: 2021-10-01
  Administered 2021-10-01: 8 mL via INTRAVENOUS
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
## 2021-10-02 ENCOUNTER — Inpatient Hospital Stay: Payer: Medicare HMO | Admitting: Oncology

## 2021-10-02 ENCOUNTER — Telehealth: Payer: Self-pay | Admitting: Genetic Counselor

## 2021-10-02 ENCOUNTER — Ambulatory Visit: Payer: Self-pay | Admitting: Genetic Counselor

## 2021-10-02 ENCOUNTER — Encounter: Payer: Self-pay | Admitting: Genetic Counselor

## 2021-10-02 ENCOUNTER — Other Ambulatory Visit: Payer: Self-pay | Admitting: Hematology and Oncology

## 2021-10-02 ENCOUNTER — Other Ambulatory Visit: Payer: Self-pay | Admitting: Oncology

## 2021-10-02 ENCOUNTER — Encounter: Payer: Self-pay | Admitting: Oncology

## 2021-10-02 ENCOUNTER — Inpatient Hospital Stay: Payer: Medicare HMO

## 2021-10-02 VITALS — BP 137/92 | HR 67 | Temp 98.2°F | Resp 18 | Ht <= 58 in | Wt 165.8 lb

## 2021-10-02 DIAGNOSIS — Z803 Family history of malignant neoplasm of breast: Secondary | ICD-10-CM

## 2021-10-02 DIAGNOSIS — Z1379 Encounter for other screening for genetic and chromosomal anomalies: Secondary | ICD-10-CM

## 2021-10-02 DIAGNOSIS — C773 Secondary and unspecified malignant neoplasm of axilla and upper limb lymph nodes: Secondary | ICD-10-CM | POA: Diagnosis not present

## 2021-10-02 DIAGNOSIS — I2699 Other pulmonary embolism without acute cor pulmonale: Secondary | ICD-10-CM

## 2021-10-02 DIAGNOSIS — C50411 Malignant neoplasm of upper-outer quadrant of right female breast: Secondary | ICD-10-CM

## 2021-10-02 DIAGNOSIS — Z170 Estrogen receptor positive status [ER+]: Secondary | ICD-10-CM

## 2021-10-02 LAB — CBC: RBC: 3.99 (ref 3.87–5.11)

## 2021-10-02 LAB — COMPREHENSIVE METABOLIC PANEL
Albumin: 3.2 — AB (ref 3.5–5.0)
Calcium: 8.3 — AB (ref 8.7–10.7)

## 2021-10-02 LAB — CBC AND DIFFERENTIAL
HCT: 37 (ref 36–46)
Hemoglobin: 12.5 (ref 12.0–16.0)
Neutrophils Absolute: 3.31
Platelets: 277 10*3/uL (ref 150–400)
WBC: 4.6

## 2021-10-02 LAB — HEPATIC FUNCTION PANEL
ALT: 29 U/L (ref 7–35)
AST: 77 — AB (ref 13–35)
Alkaline Phosphatase: 183 — AB (ref 25–125)
Bilirubin, Total: 0.9

## 2021-10-02 LAB — BASIC METABOLIC PANEL
BUN: 4 (ref 4–21)
CO2: 28 — AB (ref 13–22)
Chloride: 102 (ref 99–108)
Creatinine: 0.7 (ref 0.5–1.1)
Glucose: 96
Potassium: 3.8 mEq/L (ref 3.5–5.1)
Sodium: 136 — AB (ref 137–147)

## 2021-10-02 NOTE — Progress Notes (Signed)
HPI:   Ms. Lappin was previously seen in the [facility] due to a personal and family history of breast cancer and concerns regarding a hereditary predisposition to cancer. Please refer to our prior cancer genetics clinic note for more information regarding our discussion, assessment and recommendations, at the time. Ms. Barr recent genetic test results were disclosed to her, as were recommendations warranted by these results. These results and recommendations are discussed in more detail below.  CANCER HISTORY:  Oncology History  Malignant neoplasm of upper-outer quadrant of right breast in female, estrogen receptor positive (HCC)  05/14/2021 Initial Diagnosis   Breast cancer of upper-outer quadrant of right female breast (HCC)   05/25/2021 Cancer Staging   Staging form: Breast, AJCC 8th Edition - Clinical stage from 05/25/2021: Stage IB (cT1c, cN1(sn), cM0, G2, ER+, PR+, HER2-) - Signed by Dellia Beckwith, MD on 06/17/2021 Histopathologic type: Lobular carcinoma, NOS Stage prefix: Initial diagnosis Method of lymph node assessment: Sentinel lymph node biopsy Nuclear grade: G2 Histologic grading system: 3 grade system Laterality: Right Tumor size (mm): 20 Lymph-vascular invasion (LVI): LVI not present (absent)/not identified Diagnostic confirmation: Positive histology Specimen type: Excision Staged by: Managing physician Menopausal status: Postmenopausal Ki-67 (%): 20 Stage used in treatment planning: Yes National guidelines used in treatment planning: Yes Type of national guideline used in treatment planning: NCCN   06/26/2021 Imaging   CT chest, abdomen, pelvis  IMPRESSION:  1. Postsurgical change in the RIGHT chest wall consistent with  mastectomy and probable RIGHT axillary nodal dissection. Small fluid  collection in the RIGHT axilla likely represents a postsurgical  seroma.  2. No evidence of lymphadenopathy in the mediastinum.  3. No evidence of  pulmonary metastasis. Subpleural reticulation is  favored benign. Branching nodular pattern in the LEFT upper lobe is  favored post infectious or inflammatory.  4. No evidence of metastasis in the abdomen pelvis.  5. No evidence skeletal metastasis.    07/23/2021 -  Chemotherapy   Patient is on Treatment Plan : Breast AC q21 Days / PACLitaxel q7d     09/19/2021 Imaging   CT abdomen/pelvis  1. Somewhat amorphous hyperenhancing lesion with overlying capsular  retraction centered in the anterior right lobe of the liver, hepatic  segments V/VIII, measuring 7.3 x 3.1 cm. This appearance has evolved  over multiple sequential prior examinations dating back to  01/06/2020 but is similar in appearance to most recent examination  dated 06/26/2021. This unusual lesion is of uncertain significance  but worrisome in the setting of referable pain and laboratory  abnormalities. Capsular retraction is generally concerning for  malignancy such as cholangiocarcinoma, however apparent enhancement  characteristics are not typical. Morphology is likewise not typical  for metastasis, although this is a definite differential  consideration in the setting of known breast malignancy. Recommend  contrast enhanced MRI of the liver to further evaluate.  2. No other evidence of metastatic disease in the abdomen or pelvis.  3. Underlying hepatic steatosis.  4. Status post sleeve gastrectomy and cholecystectomy.  5. New, although age indeterminate subtle inferior endplate wedge  deformity of the T9 vertebral body, less than 25% anterior height  loss. Correlate for acute point tenderness.    09/30/2021 Genetic Testing   Negative hereditary cancer genetic testing: no pathogenic variants detected in [facility] CancerNext+RNAinsight Panel.  Report date is September 30, 2021.   The CancerNext gene panel offered by [facility] includes sequencing, rearrangement analysis, and RNA analysis for the following 36 genes:  APC, ATM,  AXIN2, BARD1, BMPR1A, BRCA1, BRCA2, BRIP1, CDH1, CDK4, CDKN2A, CHEK2, DICER1, HOXB13, EPCAM, GREM1, MLH1, MSH2, MSH3, MSH6, MUTYH, NBN, NF1, NTHL1, PALB2, PMS2, POLD1, POLE, PTEN, RAD51C, RAD51D, RECQL, SMAD4, SMARCA4, STK11, and TP53.      FAMILY HISTORY:  We obtained a detailed, 4-generation family history.  Significant diagnoses are listed below: Family History  Problem Relation Age of Onset   Breast cancer Maternal Aunt        dx after 79   Breast cancer Paternal Aunt        51s   Lymphoma Maternal Grandmother 54   Breast cancer Cousin 12       paternal female cousin       Ms. Mukherjee is unaware of previous family history of genetic testing for hereditary cancer risks. There is no reported Ashkenazi Jewish ancestry. There is no known consanguinity.    GENETIC TEST RESULTS:  The [facility] CancerNext+RNAinsight Panel found no pathogenic mutations.   The CancerNext gene panel offered by [employer] includes sequencing, rearrangement analysis, and RNA analysis for the following 36 genes:   APC, ATM, AXIN2, BARD1, BMPR1A, BRCA1, BRCA2, BRIP1, CDH1, CDK4, CDKN2A, CHEK2, DICER1, HOXB13, EPCAM, GREM1, MLH1, MSH2, MSH3, MSH6, MUTYH, NBN, NF1, NTHL1, PALB2, PMS2, POLD1, POLE, PTEN, RAD51C, RAD51D, RECQL, SMAD4, SMARCA4, STK11, and TP53.  .   The test report has been scanned into EPIC and is located under the Molecular Pathology section of the Results Review tab.  A portion of the result report is included below for reference. Genetic testing reported out on September 30, 2021.      Even though a pathogenic variant was not identified, possible explanations for the cancer in the family may include: There may be no hereditary risk for cancer in the family. The cancers in Ms. Chancellor and/or her family may be sporadic/familial or due to other genetic and environmental factors. There may be a gene mutation in one of these genes that current testing methods cannot detect but that chance is small. There  could be another gene that has not yet been discovered, or that we have not yet tested, that is responsible for the cancer diagnoses in the family.  It is also possible there is a hereditary cause for the cancer in the family that Ms. Magistro did not inherit.   Therefore, it is important to remain in touch with cancer genetics in the future so that we can continue to offer Ms. Lyson the most up to date genetic testing.    ADDITIONAL GENETIC TESTING:  We discussed with Ms. Djuric that her genetic testing was fairly extensive.  If there are additional relevant genes identified to increase cancer risk that can be analyzed in the future, we would be happy to discuss and coordinate this testing at that time.     CANCER SCREENING RECOMMENDATIONS:  Ms. Lezama test result is considered negative (normal).  This means that we have not identified a hereditary cause for her personal history of breast cancer at this time.   An individual's cancer risk and medical management are not determined by genetic test results alone. Overall cancer risk assessment incorporates additional factors, including personal medical history, family history, and any available genetic information that may result in a personalized plan for cancer prevention and surveillance. Therefore, it is recommended she continue to follow the cancer management and screening guidelines provided by her oncology and primary healthcare provider.  RECOMMENDATIONS FOR FAMILY MEMBERS:   Since she did not inherit a  identifiable mutation in a cancer predisposition gene included on this panel, her children could not have inherited a known mutation from her in one of these genes. Individuals in this family might be at some increased risk of developing cancer, over the general population risk, due to the family history of cancer.  Individuals in the family should notify their providers of the family history of cancer. We recommend women in this family have a yearly  mammogram beginning at age 61, or 33 years younger than the earliest onset of cancer, an annual clinical breast exam, and perform monthly breast self-exams.  Other members of the family may still carry a pathogenic variant in one of these genes that Ms. Grafe did not inherit. Based on the family history, we recommend her paternal cousin, who was diagnosed with breast cancer at age 38, have genetic counseling and testing. Ms. Laramore can let us know if we can be of any assistance in coordinating genetic counseling and/or testing for this family member.     FOLLOW-UP:  Lastly, we discussed with Ms. Pethel that cancer genetics is a rapidly advancing field and it is possible that new genetic tests will be appropriate for her and/or her family members in the future. We encouraged her to remain in contact with cancer genetics on an annual basis so we can update her personal and family histories and let her know of advances in cancer genetics that may benefit this family.   Our contact number was provided. Ms. Scaduto questions were answered to her satisfaction, and she knows she is welcome to call us at anytime with additional questions or concerns.   Leiby Pigeon M. Rennie Plowman, MS, [facility] Genetic Counselor [email or web address] (P) [phone]

## 2021-10-03 ENCOUNTER — Other Ambulatory Visit: Payer: Self-pay | Admitting: Pharmacist

## 2021-10-03 MED FILL — Fosaprepitant Dimeglumine For IV Infusion 150 MG (Base Eq): INTRAVENOUS | Qty: 5 | Status: AC

## 2021-10-03 MED FILL — Dexamethasone Sodium Phosphate Inj 100 MG/10ML: INTRAMUSCULAR | Qty: 1 | Status: AC

## 2021-10-03 MED FILL — Doxorubicin HCl Inj 2 MG/ML: INTRAVENOUS | Qty: 47 | Status: AC

## 2021-10-03 MED FILL — Cyclophosphamide For Inj 1 GM: INTRAMUSCULAR | Qty: 47 | Status: AC

## 2021-10-04 ENCOUNTER — Inpatient Hospital Stay: Payer: Medicare HMO

## 2021-10-04 VITALS — BP 128/78 | HR 77 | Temp 98.0°F | Resp 18 | Ht <= 58 in | Wt 165.0 lb

## 2021-10-04 DIAGNOSIS — C50411 Malignant neoplasm of upper-outer quadrant of right female breast: Secondary | ICD-10-CM

## 2021-10-04 DIAGNOSIS — Z5111 Encounter for antineoplastic chemotherapy: Secondary | ICD-10-CM | POA: Diagnosis not present

## 2021-10-04 MED ORDER — SODIUM CHLORIDE 0.9 % IV SOLN
10.0000 mg | Freq: Once | INTRAVENOUS | Status: AC
  Administered 2021-10-04: 10 mg via INTRAVENOUS
  Filled 2021-10-04: qty 10

## 2021-10-04 MED ORDER — SODIUM CHLORIDE 0.9% FLUSH
10.0000 mL | INTRAVENOUS | Status: DC | PRN
  Administered 2021-10-04: 10 mL

## 2021-10-04 MED ORDER — PALONOSETRON HCL INJECTION 0.25 MG/5ML
0.2500 mg | Freq: Once | INTRAVENOUS | Status: AC
  Administered 2021-10-04: 0.25 mg via INTRAVENOUS
  Filled 2021-10-04: qty 5

## 2021-10-04 MED ORDER — SODIUM CHLORIDE 0.9 % IV SOLN
600.0000 mg/m2 | Freq: Once | INTRAVENOUS | Status: AC
  Administered 2021-10-04: 940 mg via INTRAVENOUS
  Filled 2021-10-04: qty 47

## 2021-10-04 MED ORDER — DOXORUBICIN HCL CHEMO IV INJECTION 2 MG/ML
60.0000 mg/m2 | Freq: Once | INTRAVENOUS | Status: AC
  Administered 2021-10-04: 94 mg via INTRAVENOUS
  Filled 2021-10-04: qty 47

## 2021-10-04 MED ORDER — SODIUM CHLORIDE 0.9 % IV SOLN
150.0000 mg | Freq: Once | INTRAVENOUS | Status: AC
  Administered 2021-10-04: 150 mg via INTRAVENOUS
  Filled 2021-10-04: qty 150

## 2021-10-04 MED ORDER — SODIUM CHLORIDE 0.9 % IV SOLN: Freq: Once | INTRAVENOUS | Status: AC

## 2021-10-04 MED ORDER — HEPARIN SOD (PORK) LOCK FLUSH 100 UNIT/ML IV SOLN
500.0000 [IU] | Freq: Once | INTRAVENOUS | Status: AC | PRN
  Administered 2021-10-04: 500 [IU]

## 2021-10-04 NOTE — Patient Instructions (Signed)
Cyclophosphamide Injection What is this medication? CYCLOPHOSPHAMIDE (sye kloe FOSS fa mide) is a chemotherapy drug. It slows the growth of cancer cells. This medicine is used to treat many types of cancer like lymphoma, myeloma, leukemia, breast cancer, and ovarian cancer, to name a few. This medicine may be used for other purposes; ask your health care provider or pharmacist if you have questions. COMMON BRAND NAME(S): Cyclophosphamide, Cytoxan, Neosar What should I tell my care team before I take this medication? They need to know if you have any of these conditions: heart disease history of irregular heartbeat infection kidney disease liver disease low blood counts, like white cells, platelets, or red blood cells on hemodialysis recent or ongoing radiation therapy scarring or thickening of the lungs trouble passing urine an unusual or allergic reaction to cyclophosphamide, other medicines, foods, dyes, or preservatives pregnant or trying to get pregnant breast-feeding How should I use this medication? This drug is usually given as an injection into a vein or muscle or by infusion into a vein. It is administered in a hospital or clinic by a specially trained health care professional. Talk to your pediatrician regarding the use of this medicine in children. Special care may be needed. Overdosage: If you think you have taken too much of this medicine contact a poison control center or emergency room at once. NOTE: This medicine is only for you. Do not share this medicine with others. What if I miss a dose? It is important not to miss your dose. Call your doctor or health care professional if you are unable to keep an appointment. What may interact with this medication? amphotericin B azathioprine certain antivirals for HIV or hepatitis certain medicines for blood pressure, heart disease, irregular heart beat certain medicines that treat or prevent blood clots like warfarin certain  other medicines for cancer cyclosporine etanercept indomethacin medicines that relax muscles for surgery medicines to increase blood counts metronidazole This list may not describe all possible interactions. Give your health care provider a list of all the medicines, herbs, non-prescription drugs, or dietary supplements you use. Also tell them if you smoke, drink alcohol, or use illegal drugs. Some items may interact with your medicine. What should I watch for while using this medication? Your condition will be monitored carefully while you are receiving this medicine. You may need blood work done while you are taking this medicine. Drink water or other fluids as directed. Urinate often, even at night. Some products may contain alcohol. Ask your health care professional if this medicine contains alcohol. Be sure to tell all health care professionals you are taking this medicine. Certain medicines, like metronidazole and disulfiram, can cause an unpleasant reaction when taken with alcohol. The reaction includes flushing, headache, nausea, vomiting, sweating, and increased thirst. The reaction can last from 30 minutes to several hours. Do not become pregnant while taking this medicine or for 1 year after stopping it. Women should inform their health care professional if they wish to become pregnant or think they might be pregnant. Men should not father a child while taking this medicine and for 4 months after stopping it. There is potential for serious side effects to an unborn child. Talk to your health care professional for more information. Do not breast-feed an infant while taking this medicine or for 1 week after stopping it. This medicine has caused ovarian failure in some women. This medicine may make it more difficult to get pregnant. Talk to your health care professional if you are  concerned about your fertility. This medicine has caused decreased sperm counts in some men. This may make it  more difficult to father a child. Talk to your health care professional if you are concerned about your fertility. Call your health care professional for advice if you get a fever, chills, or sore throat, or other symptoms of a cold or flu. Do not treat yourself. This medicine decreases your body's ability to fight infections. Try to avoid being around people who are sick. Avoid taking medicines that contain aspirin, acetaminophen, ibuprofen, naproxen, or ketoprofen unless instructed by your health care professional. These medicines may hide a fever. Talk to your health care professional about your risk of cancer. You may be more at risk for certain types of cancer if you take this medicine. If you are going to need surgery or other procedure, tell your health care professional that you are using this medicine. Be careful brushing or flossing your teeth or using a toothpick because you may get an infection or bleed more easily. If you have any dental work done, tell your dentist you are receiving this medicine. What side effects may I notice from receiving this medication? Side effects that you should report to your doctor or health care professional as soon as possible: allergic reactions like skin rash, itching or hives, swelling of the face, lips, or tongue breathing problems nausea, vomiting signs and symptoms of bleeding such as bloody or black, tarry stools; red or dark brown urine; spitting up blood or brown material that looks like coffee grounds; red spots on the skin; unusual bruising or bleeding from the eyes, gums, or nose signs and symptoms of heart failure like fast, irregular heartbeat, sudden weight gain; swelling of the ankles, feet, hands signs and symptoms of infection like fever; chills; cough; sore throat; pain or trouble passing urine signs and symptoms of kidney injury like trouble passing urine or change in the amount of urine signs and symptoms of liver injury like dark yellow  or brown urine; general ill feeling or flu-like symptoms; light-colored stools; loss of appetite; nausea; right upper belly pain; unusually weak or tired; yellowing of the eyes or skin Side effects that usually do not require medical attention (report to your doctor or health care professional if they continue or are bothersome): confusion decreased hearing diarrhea facial flushing hair loss headache loss of appetite missed menstrual periods signs and symptoms of low red blood cells or anemia such as unusually weak or tired; feeling faint or lightheaded; falls skin discoloration This list may not describe all possible side effects. Call your doctor for medical advice about side effects. You may report side effects to FDA at 1-800-FDA-1088. Where should I keep my medication? This drug is given in a hospital or clinic and will not be stored at home. NOTE: This sheet is a summary. It may not cover all possible information. If you have questions about this medicine, talk to your doctor, pharmacist, or health care provider.  2023 Elsevier/Gold Standard (2021-02-23 00:00:00) Doxorubicin injection What is this medication? DOXORUBICIN (dox oh ROO bi sin) is a chemotherapy drug. It is used to treat many kinds of cancer like leukemia, lymphoma, neuroblastoma, sarcoma, and Wilms' tumor. It is also used to treat bladder cancer, breast cancer, lung cancer, ovarian cancer, stomach cancer, and thyroid cancer. This medicine may be used for other purposes; ask your health care provider or pharmacist if you have questions. COMMON BRAND NAME(S): Adriamycin, Adriamycin PFS, Adriamycin RDF, Rubex What should I  tell my care team before I take this medication? They need to know if you have any of these conditions: heart disease history of low blood counts caused by a medicine liver disease recent or ongoing radiation therapy an unusual or allergic reaction to doxorubicin, other chemotherapy agents, other  medicines, foods, dyes, or preservatives pregnant or trying to get pregnant breast-feeding How should I use this medication? This drug is given as an infusion into a vein. It is administered in a hospital or clinic by a specially trained health care professional. If you have pain, swelling, burning or any unusual feeling around the site of your injection, tell your health care professional right away. Talk to your pediatrician regarding the use of this medicine in children. Special care may be needed. Overdosage: If you think you have taken too much of this medicine contact a poison control center or emergency room at once. NOTE: This medicine is only for you. Do not share this medicine with others. What if I miss a dose? It is important not to miss your dose. Call your doctor or health care professional if you are unable to keep an appointment. What may interact with this medication? This medicine may interact with the following medications: 6-mercaptopurine paclitaxel phenytoin St. John's Wort trastuzumab verapamil This list may not describe all possible interactions. Give your health care provider a list of all the medicines, herbs, non-prescription drugs, or dietary supplements you use. Also tell them if you smoke, drink alcohol, or use illegal drugs. Some items may interact with your medicine. What should I watch for while using this medication? This drug may make you feel generally unwell. This is not uncommon, as chemotherapy can affect healthy cells as well as cancer cells. Report any side effects. Continue your course of treatment even though you feel ill unless your doctor tells you to stop. There is a maximum amount of this medicine you should receive throughout your life. The amount depends on the medical condition being treated and your overall health. Your doctor will watch how much of this medicine you receive in your lifetime. Tell your doctor if you have taken this medicine  before. You may need blood work done while you are taking this medicine. Your urine may turn red for a few days after your dose. This is not blood. If your urine is dark or brown, call your doctor. In some cases, you may be given additional medicines to help with side effects. Follow all directions for their use. Call your doctor or health care professional for advice if you get a fever, chills or sore throat, or other symptoms of a cold or flu. Do not treat yourself. This drug decreases your body's ability to fight infections. Try to avoid being around people who are sick. This medicine may increase your risk to bruise or bleed. Call your doctor or health care professional if you notice any unusual bleeding. Talk to your doctor about your risk of cancer. You may be more at risk for certain types of cancers if you take this medicine. Do not become pregnant while taking this medicine or for 6 months after stopping it. Women should inform their doctor if they wish to become pregnant or think they might be pregnant. Men should not father a child while taking this medicine and for 6 months after stopping it. There is a potential for serious side effects to an unborn child. Talk to your health care professional or pharmacist for more information. Do not breast-feed  an infant while taking this medicine. This medicine has caused ovarian failure in some women and reduced sperm counts in some men This medicine may interfere with the ability to have a child. Talk with your doctor or health care professional if you are concerned about your fertility. This medicine may cause a decrease in Co-Enzyme Q-10. You should make sure that you get enough Co-Enzyme Q-10 while you are taking this medicine. Discuss the foods you eat and the vitamins you take with your health care professional. What side effects may I notice from receiving this medication? Side effects that you should report to your doctor or health care  professional as soon as possible: allergic reactions like skin rash, itching or hives, swelling of the face, lips, or tongue breathing problems chest pain fast or irregular heartbeat low blood counts - this medicine may decrease the number of white blood cells, red blood cells and platelets. You may be at increased risk for infections and bleeding. pain, redness, or irritation at site where injected signs of infection - fever or chills, cough, sore throat, pain or difficulty passing urine signs of decreased platelets or bleeding - bruising, pinpoint red spots on the skin, black, tarry stools, blood in the urine swelling of the ankles, feet, hands tiredness weakness Side effects that usually do not require medical attention (report to your doctor or health care professional if they continue or are bothersome): diarrhea hair loss mouth sores nail discoloration or damage nausea red colored urine vomiting This list may not describe all possible side effects. Call your doctor for medical advice about side effects. You may report side effects to FDA at 1-800-FDA-1088. Where should I keep my medication? This drug is given in a hospital or clinic and will not be stored at home. NOTE: This sheet is a summary. It may not cover all possible information. If you have questions about this medicine, talk to your doctor, pharmacist, or health care provider.  2023 Elsevier/Gold Standard (2016-11-28 00:00:00)

## 2021-10-05 ENCOUNTER — Telehealth: Payer: Self-pay

## 2021-10-05 ENCOUNTER — Inpatient Hospital Stay: Payer: Medicare HMO

## 2021-10-05 VITALS — BP 148/78 | HR 78 | Temp 98.1°F | Resp 18 | Ht <= 58 in | Wt 165.0 lb

## 2021-10-05 DIAGNOSIS — C50411 Malignant neoplasm of upper-outer quadrant of right female breast: Secondary | ICD-10-CM

## 2021-10-05 DIAGNOSIS — Z5111 Encounter for antineoplastic chemotherapy: Secondary | ICD-10-CM | POA: Diagnosis not present

## 2021-10-05 MED ORDER — PEGFILGRASTIM-JMDB 6 MG/0.6ML ~~LOC~~ SOSY
6.0000 mg | PREFILLED_SYRINGE | Freq: Once | SUBCUTANEOUS | Status: AC
  Administered 2021-10-05: 6 mg via SUBCUTANEOUS
  Filled 2021-10-05: qty 0.6

## 2021-10-05 NOTE — Telephone Encounter (Signed)
I spoke with Mallory Ayers. She states, "I did pretty good. I had some nausea but took Zofran". Zofran relieved the nausea. No emesis. No diarrhea. No fevers. No skin rashes/itching. Pt went for her Neulasta injection earlier today. I reminded her to take Claritin '10mg'$  po today and qd x 5 days to hopefully decrease bone pain. She will need to do this each cycle after getting the Neulasta injection. I stressed the importance of calling us if she develops temp of 100.4 or higher, day or night. She verbalized understanding.

## 2021-10-05 NOTE — Patient Instructions (Signed)

## 2021-10-23 ENCOUNTER — Encounter: Payer: Self-pay | Admitting: Hematology and Oncology

## 2021-10-23 ENCOUNTER — Inpatient Hospital Stay: Payer: Medicare HMO

## 2021-10-23 ENCOUNTER — Telehealth: Payer: Self-pay | Admitting: Hematology and Oncology

## 2021-10-23 ENCOUNTER — Inpatient Hospital Stay: Payer: Medicare HMO | Attending: Oncology | Admitting: Hematology and Oncology

## 2021-10-23 DIAGNOSIS — R932 Abnormal findings on diagnostic imaging of liver and biliary tract: Secondary | ICD-10-CM

## 2021-10-23 DIAGNOSIS — Z5189 Encounter for other specified aftercare: Secondary | ICD-10-CM | POA: Insufficient documentation

## 2021-10-23 DIAGNOSIS — C50411 Malignant neoplasm of upper-outer quadrant of right female breast: Secondary | ICD-10-CM

## 2021-10-23 DIAGNOSIS — R7401 Elevation of levels of liver transaminase levels: Secondary | ICD-10-CM

## 2021-10-23 DIAGNOSIS — I2699 Other pulmonary embolism without acute cor pulmonale: Secondary | ICD-10-CM

## 2021-10-23 DIAGNOSIS — Z5111 Encounter for antineoplastic chemotherapy: Secondary | ICD-10-CM | POA: Insufficient documentation

## 2021-10-23 DIAGNOSIS — Z170 Estrogen receptor positive status [ER+]: Secondary | ICD-10-CM

## 2021-10-23 LAB — BASIC METABOLIC PANEL
BUN: 2 — AB (ref 4–21)
CO2: 22 (ref 13–22)
Chloride: 100 (ref 99–108)
Creatinine: 0.5 (ref 0.5–1.1)
Glucose: 112
Potassium: 3.6 mEq/L (ref 3.5–5.1)
Sodium: 134 — AB (ref 137–147)

## 2021-10-23 LAB — CORRECTED CALCIUM (CC13): Calcium, Corrected: 9.3

## 2021-10-23 LAB — CBC AND DIFFERENTIAL
HCT: 37 (ref 36–46)
Hemoglobin: 12.4 (ref 12.0–16.0)
Neutrophils Absolute: 5.08
Platelets: 250 10*3/uL (ref 150–400)
WBC: 6.2

## 2021-10-23 LAB — HEPATIC FUNCTION PANEL
ALT: 59 U/L — AB (ref 7–35)
AST: 164 — AB (ref 13–35)
Alkaline Phosphatase: 304 — AB (ref 25–125)
Bilirubin, Total: 0.8

## 2021-10-23 LAB — COMPREHENSIVE METABOLIC PANEL
Albumin: 3.3 — AB (ref 3.5–5.0)
Calcium: 8.6 — AB (ref 8.7–10.7)

## 2021-10-23 LAB — CBC: RBC: 4.01 (ref 3.87–5.11)

## 2021-10-23 MED ORDER — LORAZEPAM 1 MG PO TABS: 1.0000 mg | ORAL_TABLET | Freq: Four times a day (QID) | ORAL | 0 refills | Status: DC | PRN

## 2021-10-23 NOTE — Telephone Encounter (Signed)
Per 10/23/21 los next appt scheduled and confirmed with patient 

## 2021-10-23 NOTE — Assessment & Plan Note (Signed)
History of recurrent pulmonary emboli.  The first pulmonary embolism developed after trauma in January 2009, so so is not likely to be related to a hypercoagulable state but I will investigate as to whether this was evaluated at [facility] in [place].  However she had a second episode of pulmonary emboli in February 2019.  She then had 3rd single segmental pulmonary embolism of the right upper lobe in 07/26/21.  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  She will need to continue on lifelong anticoagulation.
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
## 2021-10-23 NOTE — Assessment & Plan Note (Signed)
CT abdomen and pelvis was done to evaluate elevated transaminases.  This was abnormal revealing and amorphous hyperenhancing lesion with overlying capsular retraction centered in the anterior right lobe of the liver, hepatic segments V/VIII, measuring 7.3 x 3.1 cm. This appearance has evolved over multiple sequential prior examinations dating back to 01/06/2020, but was similar in appearance to most recent examination dated 06/26/2021. This unusual lesion is of uncertain significance but worrisome in the setting of referable pain and laboratory abnormalities. Capsular retraction is generally concerning for malignancy such as cholangiocarcinoma, however apparent enhancement characteristics are not typical. Morphology is likewise not typical for metastasis, although this is a definite differential consideration in the setting of known breast malignancy. Recommend contrast enhanced MRI of the liver to further evaluate.This lesion was not mentioned on previous CT reports.   MRI abdomen revealed findings on the recent CT examination reflect heterogeneous hepatic steatosis with focal fatty sparing throughout the central aspect of the liver which also exhibits an associated perfusion anomaly. No definite suspicious hepatic lesion identified at this time. A single repeat abdominal MRI with and without IV gadolinium is recommended in 6 months to ensure the stability of these findings.  Status post cholecystectomy.

## 2021-10-23 NOTE — Assessment & Plan Note (Signed)
Stage IB(T1 cN1 M0) hormone receptor positiveright breast cancer diagnosed in February.  She was  teated with mastectomy. Endopredict Epclin score is 4.1 whichis consideredhigh riskcorrelatingwith a 20% risk of recurrence in the next 10 years with hormonal therapy alone. She would have an 8% benefit from chemotherapy.  She is receiving adjuvant chemotherapy with Adriamycin/cyclophosphamide for 4 cycles, followed by weekly paclitaxel for 12 doses was recommended.She had her first dose of AC onApril 17 and was admitted to the hospitalApril 20with an infection of her right mastectomy. She underwent drainage of the seroma and had slow healing, so chemotherapy was delayed to avoid affecting her ability to heal and recurrent infection. She then had elevation of her transaminases, so treatment was held to investigate this.  She proceeded with a second cycle of AC on June 29.  She will proceed with a third cycle this week.  We will plan to see her back in 3 weeks for repeat clinical assessment prior to a fourth cycle.

## 2021-10-23 NOTE — Assessment & Plan Note (Signed)
The transaminases have increased again.  This may possibly be due to chemotherapy.  We will continue to monitor this.

## 2021-10-24 ENCOUNTER — Encounter: Payer: Self-pay | Admitting: Hematology and Oncology

## 2021-10-24 ENCOUNTER — Encounter: Payer: Self-pay | Admitting: Oncology

## 2021-10-24 MED FILL — Fosaprepitant Dimeglumine For IV Infusion 150 MG (Base Eq): INTRAVENOUS | Qty: 5 | Status: AC

## 2021-10-24 MED FILL — Dexamethasone Sodium Phosphate Inj 100 MG/10ML: INTRAMUSCULAR | Qty: 1 | Status: AC

## 2021-10-24 MED FILL — Cyclophosphamide For Inj 1 GM: INTRAMUSCULAR | Qty: 47 | Status: AC

## 2021-10-24 NOTE — Progress Notes (Signed)
Ok to proceed with doxorubicin/cyclophosphamide despite AST=164 per [facility].  We will continue to monitor this closely.

## 2021-10-25 ENCOUNTER — Inpatient Hospital Stay: Payer: Medicare HMO

## 2021-10-25 DIAGNOSIS — Z5111 Encounter for antineoplastic chemotherapy: Secondary | ICD-10-CM | POA: Diagnosis not present

## 2021-10-25 DIAGNOSIS — R7401 Elevation of levels of liver transaminase levels: Secondary | ICD-10-CM | POA: Diagnosis not present

## 2021-10-25 DIAGNOSIS — C50411 Malignant neoplasm of upper-outer quadrant of right female breast: Secondary | ICD-10-CM

## 2021-10-25 DIAGNOSIS — I2699 Other pulmonary embolism without acute cor pulmonale: Secondary | ICD-10-CM | POA: Diagnosis not present

## 2021-10-25 DIAGNOSIS — Z170 Estrogen receptor positive status [ER+]: Secondary | ICD-10-CM | POA: Diagnosis not present

## 2021-10-25 DIAGNOSIS — Z5189 Encounter for other specified aftercare: Secondary | ICD-10-CM | POA: Diagnosis not present

## 2021-10-25 MED ORDER — SODIUM CHLORIDE 0.9% FLUSH
10.0000 mL | INTRAVENOUS | Status: DC | PRN
  Administered 2021-10-25: 10 mL

## 2021-10-25 MED ORDER — SODIUM CHLORIDE 0.9 % IV SOLN
10.0000 mg | Freq: Once | INTRAVENOUS | Status: AC
  Administered 2021-10-25: 10 mg via INTRAVENOUS
  Filled 2021-10-25: qty 10

## 2021-10-25 MED ORDER — SODIUM CHLORIDE 0.9 % IV SOLN: Freq: Once | INTRAVENOUS | Status: AC

## 2021-10-25 MED ORDER — PALONOSETRON HCL INJECTION 0.25 MG/5ML
0.2500 mg | Freq: Once | INTRAVENOUS | Status: AC
  Administered 2021-10-25: 0.25 mg via INTRAVENOUS
  Filled 2021-10-25: qty 5

## 2021-10-25 MED ORDER — DOXORUBICIN HCL CHEMO IV INJECTION 2 MG/ML
60.0000 mg/m2 | Freq: Once | INTRAVENOUS | Status: AC
  Administered 2021-10-25: 92 mg via INTRAVENOUS
  Filled 2021-10-25: qty 46

## 2021-10-25 MED ORDER — SODIUM CHLORIDE 0.9 % IV SOLN
600.0000 mg/m2 | Freq: Once | INTRAVENOUS | Status: AC
  Administered 2021-10-25: 940 mg via INTRAVENOUS
  Filled 2021-10-25: qty 47

## 2021-10-25 MED ORDER — SODIUM CHLORIDE 0.9 % IV SOLN
150.0000 mg | Freq: Once | INTRAVENOUS | Status: AC
  Administered 2021-10-25: 150 mg via INTRAVENOUS
  Filled 2021-10-25: qty 150

## 2021-10-25 MED ORDER — HEPARIN SOD (PORK) LOCK FLUSH 100 UNIT/ML IV SOLN
500.0000 [IU] | Freq: Once | INTRAVENOUS | Status: AC | PRN
  Administered 2021-10-25: 500 [IU]

## 2021-10-25 NOTE — Patient Instructions (Signed)
[facility]  Discharge Instructions: Thank you for choosing [facility] to provide your oncology and hematology care.  If you have a lab appointment with the [facility], please go directly to the [facility] and check in at the registration area.   Wear comfortable clothing and clothing appropriate for easy access to any Portacath or PICC line.   We strive to give you quality time with your provider. You may need to reschedule your appointment if you arrive late (15 or more minutes).  Arriving late affects you and other patients whose appointments are after yours.  Also, if you miss three or more appointments without notifying the office, you may be dismissed from the clinic at the provider's discretion.      For prescription refill requests, have your pharmacy contact our office and allow 72 hours for refills to be completed.    Today you received the following chemotherapy and/or immunotherapy agents Adriamycin, Cytoxan Doxorubicin injection What is this medication? DOXORUBICIN (dox oh ROO bi sin) is a chemotherapy drug. It is used to treat many kinds of cancer like leukemia, lymphoma, neuroblastoma, sarcoma, and Wilms' tumor. It is also used to treat bladder cancer, breast cancer, lung cancer, ovarian cancer, stomach cancer, and thyroid cancer. This medicine may be used for other purposes; ask your health care provider or pharmacist if you have questions. COMMON BRAND NAME(S): Adriamycin, Adriamycin PFS, Adriamycin RDF, Rubex What should I tell my care team before I take this medication? They need to know if you have any of these conditions: heart disease history of low blood counts caused by a medicine liver disease recent or ongoing radiation therapy an unusual or allergic reaction to doxorubicin, other chemotherapy agents, other medicines, foods, dyes, or preservatives pregnant or trying to get pregnant breast-feeding How should I use this  medication? This drug is given as an infusion into a vein. It is administered in a hospital or clinic by a specially trained health care professional. If you have pain, swelling, burning or any unusual feeling around the site of your injection, tell your health care professional right away. Talk to your pediatrician regarding the use of this medicine in children. Special care may be needed. Overdosage: If you think you have taken too much of this medicine contact a poison control center or emergency room at once. NOTE: This medicine is only for you. Do not share this medicine with others. What if I miss a dose? It is important not to miss your dose. Call your doctor or health care professional if you are unable to keep an appointment. What may interact with this medication? This medicine may interact with the following medications: 6-mercaptopurine paclitaxel phenytoin [facility]. John's Wort trastuzumab verapamil This list may not describe all possible interactions. Give your health care provider a list of all the medicines, herbs, non-prescription drugs, or dietary supplements you use. Also tell them if you smoke, drink alcohol, or use illegal drugs. Some items may interact with your medicine. What should I watch for while using this medication? This drug may make you feel generally unwell. This is not uncommon, as chemotherapy can affect healthy cells as well as cancer cells. Report any side effects. Continue your course of treatment even though you feel ill unless your doctor tells you to stop. There is a maximum amount of this medicine you should receive throughout your life. The amount depends on the medical condition being treated and your overall health. Your doctor will  watch how much of this medicine you receive in your lifetime. Tell your doctor if you have taken this medicine before. You may need blood work done while you are taking this medicine. Your urine may turn red for a few days after  your dose. This is not blood. If your urine is dark or brown, call your doctor. In some cases, you may be given additional medicines to help with side effects. Follow all directions for their use. Call your doctor or health care professional for advice if you get a fever, chills or sore throat, or other symptoms of a cold or flu. Do not treat yourself. This drug decreases your body's ability to fight infections. Try to avoid being around people who are sick. This medicine may increase your risk to bruise or bleed. Call your doctor or health care professional if you notice any unusual bleeding. Talk to your doctor about your risk of cancer. You may be more at risk for certain types of cancers if you take this medicine. Do not become pregnant while taking this medicine or for 6 months after stopping it. Women should inform their doctor if they wish to become pregnant or think they might be pregnant. Men should not father a child while taking this medicine and for 6 months after stopping it. There is a potential for serious side effects to an unborn child. Talk to your health care professional or pharmacist for more information. Do not breast-feed an infant while taking this medicine. This medicine has caused ovarian failure in some women and reduced sperm counts in some men This medicine may interfere with the ability to have a child. Talk with your doctor or health care professional if you are concerned about your fertility. This medicine may cause a decrease in Co-Enzyme Q-10. You should make sure that you get enough Co-Enzyme Q-10 while you are taking this medicine. Discuss the foods you eat and the vitamins you take with your health care professional. What side effects may I notice from receiving this medication? Side effects that you should report to your doctor or health care professional as soon as possible: allergic reactions like skin rash, itching or hives, swelling of the face, lips, or  tongue breathing problems chest pain fast or irregular heartbeat low blood counts - this medicine may decrease the number of white blood cells, red blood cells and platelets. You may be at increased risk for infections and bleeding. pain, redness, or irritation at site where injected signs of infection - fever or chills, cough, sore throat, pain or difficulty passing urine signs of decreased platelets or bleeding - bruising, pinpoint red spots on the skin, black, tarry stools, blood in the urine swelling of the ankles, feet, hands tiredness weakness Side effects that usually do not require medical attention (report to your doctor or health care professional if they continue or are bothersome): diarrhea hair loss mouth sores nail discoloration or damage nausea red colored urine vomiting This list may not describe all possible side effects. Call your doctor for medical advice about side effects. You may report side effects to FDA at 1-800-FDA-1088. Where should I keep my medication? This drug is given in a hospital or clinic and will not be stored at home. NOTE: This sheet is a summary. It may not cover all possible information. If you have questions about this medicine, talk to your doctor, pharmacist, or health care provider.  2023 Elsevier/Gold Standard (2016-11-28 00:00:00) Cyclophosphamide Injection What is this medication? CYCLOPHOSPHAMIDE (sye  kloe FOSS fa mide) is a chemotherapy drug. It slows the growth of cancer cells. This medicine is used to treat many types of cancer like lymphoma, myeloma, leukemia, breast cancer, and ovarian cancer, to name a few. This medicine may be used for other purposes; ask your health care provider or pharmacist if you have questions. COMMON BRAND NAME(S): Cyclophosphamide, Cytoxan, Neosar What should I tell my care team before I take this medication? They need to know if you have any of these conditions: heart disease history of irregular  heartbeat infection kidney disease liver disease low blood counts, like white cells, platelets, or red blood cells on hemodialysis recent or ongoing radiation therapy scarring or thickening of the lungs trouble passing urine an unusual or allergic reaction to cyclophosphamide, other medicines, foods, dyes, or preservatives pregnant or trying to get pregnant breast-feeding How should I use this medication? This drug is usually given as an injection into a vein or muscle or by infusion into a vein. It is administered in a hospital or clinic by a specially trained health care professional. Talk to your pediatrician regarding the use of this medicine in children. Special care may be needed. Overdosage: If you think you have taken too much of this medicine contact a poison control center or emergency room at once. NOTE: This medicine is only for you. Do not share this medicine with others. What if I miss a dose? It is important not to miss your dose. Call your doctor or health care professional if you are unable to keep an appointment. What may interact with this medication? amphotericin B azathioprine certain antivirals for HIV or hepatitis certain medicines for blood pressure, heart disease, irregular heart beat certain medicines that treat or prevent blood clots like warfarin certain other medicines for cancer cyclosporine etanercept indomethacin medicines that relax muscles for surgery medicines to increase blood counts metronidazole This list may not describe all possible interactions. Give your health care provider a list of all the medicines, herbs, non-prescription drugs, or dietary supplements you use. Also tell them if you smoke, drink alcohol, or use illegal drugs. Some items may interact with your medicine. What should I watch for while using this medication? Your condition will be monitored carefully while you are receiving this medicine. You may need blood work done while  you are taking this medicine. Drink water or other fluids as directed. Urinate often, even at night. Some products may contain alcohol. Ask your health care professional if this medicine contains alcohol. Be sure to tell all health care professionals you are taking this medicine. Certain medicines, like metronidazole and disulfiram, can cause an unpleasant reaction when taken with alcohol. The reaction includes flushing, headache, nausea, vomiting, sweating, and increased thirst. The reaction can last from 30 minutes to several hours. Do not become pregnant while taking this medicine or for 1 year after stopping it. Women should inform their health care professional if they wish to become pregnant or think they might be pregnant. Men should not father a child while taking this medicine and for 4 months after stopping it. There is potential for serious side effects to an unborn child. Talk to your health care professional for more information. Do not breast-feed an infant while taking this medicine or for 1 week after stopping it. This medicine has caused ovarian failure in some women. This medicine may make it more difficult to get pregnant. Talk to your health care professional if you are concerned about your fertility. This medicine has  caused decreased sperm counts in some men. This may make it more difficult to father a child. Talk to your health care professional if you are concerned about your fertility. Call your health care professional for advice if you get a fever, chills, or sore throat, or other symptoms of a cold or flu. Do not treat yourself. This medicine decreases your body's ability to fight infections. Try to avoid being around people who are sick. Avoid taking medicines that contain aspirin, acetaminophen, ibuprofen, naproxen, or ketoprofen unless instructed by your health care professional. These medicines may hide a fever. Talk to your health care professional about your risk of cancer.  You may be more at risk for certain types of cancer if you take this medicine. If you are going to need surgery or other procedure, tell your health care professional that you are using this medicine. Be careful brushing or flossing your teeth or using a toothpick because you may get an infection or bleed more easily. If you have any dental work done, tell your dentist you are receiving this medicine. What side effects may I notice from receiving this medication? Side effects that you should report to your doctor or health care professional as soon as possible: allergic reactions like skin rash, itching or hives, swelling of the face, lips, or tongue breathing problems nausea, vomiting signs and symptoms of bleeding such as bloody or black, tarry stools; red or dark brown urine; spitting up blood or brown material that looks like coffee grounds; red spots on the skin; unusual bruising or bleeding from the eyes, gums, or nose signs and symptoms of heart failure like fast, irregular heartbeat, sudden weight gain; swelling of the ankles, feet, hands signs and symptoms of infection like fever; chills; cough; sore throat; pain or trouble passing urine signs and symptoms of kidney injury like trouble passing urine or change in the amount of urine signs and symptoms of liver injury like dark yellow or brown urine; general ill feeling or flu-like symptoms; light-colored stools; loss of appetite; nausea; right upper belly pain; unusually weak or tired; yellowing of the eyes or skin Side effects that usually do not require medical attention (report to your doctor or health care professional if they continue or are bothersome): confusion decreased hearing diarrhea facial flushing hair loss headache loss of appetite missed menstrual periods signs and symptoms of low red blood cells or anemia such as unusually weak or tired; feeling faint or lightheaded; falls skin discoloration This list may not describe  all possible side effects. Call your doctor for medical advice about side effects. You may report side effects to FDA at 1-800-FDA-1088. Where should I keep my medication? This drug is given in a hospital or clinic and will not be stored at home. NOTE: This sheet is a summary. It may not cover all possible information. If you have questions about this medicine, talk to your doctor, pharmacist, or health care provider.  2023 Elsevier/Gold Standard (2021-02-23 00:00:00)       To help prevent nausea and vomiting after your treatment, we encourage you to take your nausea medication as directed.  BELOW ARE SYMPTOMS THAT SHOULD BE REPORTED IMMEDIATELY: *FEVER GREATER THAN 100.4 F (38 C) OR HIGHER *CHILLS OR SWEATING *NAUSEA AND VOMITING THAT IS NOT CONTROLLED WITH YOUR NAUSEA MEDICATION *UNUSUAL SHORTNESS OF BREATH *UNUSUAL BRUISING OR BLEEDING *URINARY PROBLEMS (pain or burning when urinating, or frequent urination) *BOWEL PROBLEMS (unusual diarrhea, constipation, pain near the anus) TENDERNESS IN MOUTH AND THROAT WITH  OR WITHOUT PRESENCE OF ULCERS (sore throat, sores in mouth, or a toothache) UNUSUAL RASH, SWELLING OR PAIN  UNUSUAL VAGINAL DISCHARGE OR ITCHING   Items with * indicate a potential emergency and should be followed up as soon as possible or go to the Emergency Department if any problems should occur.  Please show the CHEMOTHERAPY ALERT CARD or IMMUNOTHERAPY ALERT CARD at check-in to the Emergency Department and triage nurse.  Should you have questions after your visit or need to cancel or reschedule your appointment, please contact [facility]  Dept: [phone]  and follow the prompts.  Office hours are 8:00 a.m. to 4:30 p.m. Monday - Friday. Please note that voicemails left after 4:00 p.m. may not be returned until the following business day.  We are closed weekends and major holidays. You have access to a nurse at all times for urgent questions.  Please call the main number to the clinic Dept: [phone] and follow the prompts.  For any non-urgent questions, you may also contact your provider using MyChart. We now offer e-Visits for anyone 34 and older to request care online for non-urgent symptoms. For details visit [email or web address].   Also download the MyChart app! Go to the app store, search "MyChart", open the app, select [facility], and log in with your MyChart username and password.  Masks are optional in the cancer centers. If you would like for your care team to wear a mask while they are taking care of you, please let them know. For doctor visits, patients may have with them one support person who is at least 51 years old. At this time, visitors are not allowed in the infusion area.

## 2021-10-26 ENCOUNTER — Inpatient Hospital Stay: Payer: Medicare HMO

## 2021-10-26 VITALS — BP 117/67 | HR 93 | Temp 98.0°F | Resp 18

## 2021-10-26 DIAGNOSIS — Z5111 Encounter for antineoplastic chemotherapy: Secondary | ICD-10-CM | POA: Diagnosis not present

## 2021-10-26 DIAGNOSIS — Z170 Estrogen receptor positive status [ER+]: Secondary | ICD-10-CM

## 2021-10-26 MED ORDER — PEGFILGRASTIM-JMDB 6 MG/0.6ML ~~LOC~~ SOSY
6.0000 mg | PREFILLED_SYRINGE | Freq: Once | SUBCUTANEOUS | Status: AC
  Administered 2021-10-26: 6 mg via SUBCUTANEOUS
  Filled 2021-10-26: qty 0.6

## 2021-10-26 NOTE — Patient Instructions (Signed)

## 2021-10-29 ENCOUNTER — Other Ambulatory Visit: Payer: Self-pay

## 2021-10-31 ENCOUNTER — Encounter: Payer: Self-pay | Admitting: Oncology

## 2021-10-31 DIAGNOSIS — R112 Nausea with vomiting, unspecified: Secondary | ICD-10-CM | POA: Diagnosis not present

## 2021-10-31 DIAGNOSIS — C50411 Malignant neoplasm of upper-outer quadrant of right female breast: Secondary | ICD-10-CM

## 2021-10-31 DIAGNOSIS — Z170 Estrogen receptor positive status [ER+]: Secondary | ICD-10-CM

## 2021-10-31 DIAGNOSIS — R531 Weakness: Secondary | ICD-10-CM | POA: Diagnosis not present

## 2021-11-02 ENCOUNTER — Encounter: Payer: Self-pay | Admitting: Oncology

## 2021-11-03 DIAGNOSIS — Z170 Estrogen receptor positive status [ER+]: Secondary | ICD-10-CM | POA: Diagnosis not present

## 2021-11-03 DIAGNOSIS — C50411 Malignant neoplasm of upper-outer quadrant of right female breast: Secondary | ICD-10-CM | POA: Diagnosis not present

## 2021-11-05 DIAGNOSIS — Z170 Estrogen receptor positive status [ER+]: Secondary | ICD-10-CM | POA: Diagnosis not present

## 2021-11-05 DIAGNOSIS — C50411 Malignant neoplasm of upper-outer quadrant of right female breast: Secondary | ICD-10-CM | POA: Diagnosis not present

## 2021-11-06 ENCOUNTER — Other Ambulatory Visit: Payer: Self-pay

## 2021-11-13 ENCOUNTER — Encounter: Payer: Self-pay | Admitting: Hematology and Oncology

## 2021-11-13 ENCOUNTER — Inpatient Hospital Stay: Payer: Medicare HMO | Attending: Oncology | Admitting: Hematology and Oncology

## 2021-11-13 ENCOUNTER — Other Ambulatory Visit: Payer: Self-pay | Admitting: Hematology and Oncology

## 2021-11-13 ENCOUNTER — Inpatient Hospital Stay: Payer: Medicare HMO

## 2021-11-13 ENCOUNTER — Ambulatory Visit: Payer: Medicare HMO

## 2021-11-13 VITALS — BP 112/70 | HR 72 | Temp 98.5°F | Resp 14 | Ht <= 58 in

## 2021-11-13 VITALS — BP 113/72 | HR 69 | Temp 98.1°F | Resp 18 | Ht <= 58 in

## 2021-11-13 DIAGNOSIS — C50411 Malignant neoplasm of upper-outer quadrant of right female breast: Secondary | ICD-10-CM

## 2021-11-13 DIAGNOSIS — R7401 Elevation of levels of liver transaminase levels: Secondary | ICD-10-CM

## 2021-11-13 DIAGNOSIS — R197 Diarrhea, unspecified: Secondary | ICD-10-CM

## 2021-11-13 DIAGNOSIS — Z170 Estrogen receptor positive status [ER+]: Secondary | ICD-10-CM

## 2021-11-13 DIAGNOSIS — E876 Hypokalemia: Secondary | ICD-10-CM

## 2021-11-13 DIAGNOSIS — R932 Abnormal findings on diagnostic imaging of liver and biliary tract: Secondary | ICD-10-CM | POA: Diagnosis not present

## 2021-11-13 LAB — COMPREHENSIVE METABOLIC PANEL
Albumin: 2.7 — AB (ref 3.5–5.0)
Calcium: 7.6 — AB (ref 8.7–10.7)

## 2021-11-13 LAB — BASIC METABOLIC PANEL
BUN: 2 — AB (ref 4–21)
CO2: 27 — AB (ref 13–22)
Chloride: 99 (ref 99–108)
Creatinine: 0.5 (ref 0.5–1.1)
Glucose: 96
Potassium: 2.4 mEq/L — AB (ref 3.5–5.1)
Sodium: 135 — AB (ref 137–147)

## 2021-11-13 LAB — HEPATIC FUNCTION PANEL
ALT: 29 U/L (ref 7–35)
AST: 77 — AB (ref 13–35)
Alkaline Phosphatase: 192 — AB (ref 25–125)
Bilirubin, Total: 0.5

## 2021-11-13 LAB — PROTEIN, TOTAL: Total Protein: 5.5 g/dL — AB (ref 6.3–8.2)

## 2021-11-13 LAB — CORRECTED CALCIUM (CC13): Calcium, Corrected: 8.9

## 2021-11-13 MED ORDER — LORAZEPAM 1 MG PO TABS: 1.0000 mg | ORAL_TABLET | Freq: Four times a day (QID) | ORAL | 0 refills | Status: DC | PRN

## 2021-11-13 MED ORDER — HEPARIN SOD (PORK) LOCK FLUSH 100 UNIT/ML IV SOLN
500.0000 [IU] | Freq: Once | INTRAVENOUS | Status: AC | PRN
  Administered 2021-11-13: 500 [IU]

## 2021-11-13 MED ORDER — SODIUM CHLORIDE 0.9% FLUSH
10.0000 mL | Freq: Once | INTRAVENOUS | Status: AC | PRN
  Administered 2021-11-13: 10 mL

## 2021-11-13 MED ORDER — SODIUM CHLORIDE 0.9 % IV SOLN: Freq: Once | INTRAVENOUS | Status: AC

## 2021-11-13 MED ORDER — VANCOMYCIN HCL 125 MG PO CAPS: 125.0000 mg | ORAL_CAPSULE | Freq: Four times a day (QID) | ORAL | 0 refills | Status: DC

## 2021-11-13 MED ORDER — POTASSIUM CHLORIDE 10 MEQ/100ML IV SOLN
10.0000 meq | INTRAVENOUS | Status: AC
  Administered 2021-11-13 (×4): 10 meq via INTRAVENOUS
  Filled 2021-11-13 (×4): qty 100

## 2021-11-13 NOTE — Assessment & Plan Note (Signed)
Recurrent severe hypokalemia, despite oral potassium chloride 20 mEq twice daily, due to diarrhea.  I will give her IV potassium today as an outpatient and continue oral.  I recommended she use Pepto-Bismol to better control her diarrhea.

## 2021-11-13 NOTE — Progress Notes (Signed)
Face to face contact with pt in lobby. Pt is recovering from recent hospitalization after her last chemo. Pt has been very sick and is here today for assessment of condition. Pt appears weak. She is accompanied by her fiance.

## 2021-11-13 NOTE — Assessment & Plan Note (Addendum)
Stage IB hormone receptor positive breast cancer  diagnosed in February.  She was  teated with mastectomy. Endopredict Epclin score is 4.1 whichis consideredhigh riskcorrelatingwith a 20% risk of recurrence in the next 10 years with hormonal therapy alone. She would have an 8% benefit from chemotherapy.  She has been receiving adjuvant chemotherapy with Adriamycin/cyclophosphamide [facility]).  She completed 3 cycles on July 20 and was admitted the following week with severe mucositis, intractable nausea and vomiting, and severe diarrhea with hypokalemia.  Due to this, we decided to stop the [facility] chemotherapy and hopefully proceed with weekly paclitaxel once she recovered.

## 2021-11-13 NOTE — Assessment & Plan Note (Signed)
CT abdomen and pelvis was done to evaluate elevated transaminases.  This was abnormal revealing and amorphous hyperenhancing lesion with overlying capsular retraction centered in the anterior right lobe of the liver, hepatic segments V/VIII, measuring 7.3 x 3.1 cm. This appearance has evolved over multiple sequential prior examinations dating back to 01/06/2020, but was similar in appearance to most recent examination dated 06/26/2021. This unusual lesion is of uncertain significance but worrisome in the setting of referable pain and laboratory abnormalities. Capsular retraction is generally concerning for malignancy such as cholangiocarcinoma, however apparent enhancement characteristics are not typical. Morphology is likewise not typical for metastasis, although this is a definite differential consideration in the setting of known breast malignancy. Recommend contrast enhanced MRI of the liver to further evaluate.This lesion was not mentioned on previous CT reports.   MRI abdomen revealed findings on the recent CT examination reflect heterogeneous hepatic steatosis with focal fatty sparing throughout the central aspect of the liver which also exhibits an associated perfusion anomaly. No definite suspicious hepatic lesion identified at this time. A single repeat abdominal MRI with and without IV gadolinium is recommended in 6 months to ensure the stability of these findings.  Status post cholecystectomy.

## 2021-11-13 NOTE — Assessment & Plan Note (Signed)
She reports chronic diarrhea since her cholecystectomy.  She had severe diarrhea on hospital admission in July. Stool was positive for enteropathic E. Coli and due to her immunocompromised state, she was treated with IV antibiotics. She has persistent diarrhea with 5-6 stools daily, so I will repeat stool studies including C. Diff.  She is not taking anything for the diarrhea, so I recommended she use Pepto Bismol to try to control the diarrhea is much as possible.

## 2021-11-13 NOTE — Assessment & Plan Note (Signed)
The AST remains mildly elevated, fluctuating up and down.  The ALT is now normal.  This may possibly be due to chemotherapy.  We will continue to monitor this.
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
## 2021-11-13 NOTE — Patient Instructions (Addendum)
\  [id number] Hypokalemia means that the amount of potassium in the blood is lower than normal. Potassium is a mineral (electrolyte) that helps regulate the amount of fluid in the body. It also stimulates muscle tightening (contraction) and helps nerves work properly. Normally, most of the body's potassium is inside cells, and only a very small amount is in the blood. Because the amount in the blood is so small, minor changes to potassium levels in the blood can be life-threatening. What are the causes? This condition may be caused by: Antibiotic medicine. Diarrhea or vomiting. Taking too much of a medicine that helps you have a bowel movement (laxative) can cause diarrhea and lead to hypokalemia. Chronic kidney disease (CKD). Medicines that help the body get rid of excess fluid (diuretics). Eating disorders, such as anorexia or bulimia. Low magnesium levels in the body. Sweating a lot. What are the signs or symptoms? Symptoms of this condition include: Weakness. Constipation. Fatigue. Muscle cramps. Mental confusion. Skipped heartbeats or irregular heartbeat (palpitations). Tingling or numbness. How is this diagnosed? This condition is diagnosed with a blood test. How is this treated? This condition may be treated by: Taking potassium supplements. Adjusting the medicines that you take. Eating more foods that contain a lot of potassium. If your potassium level is very low, you may need to get potassium through an IV and be monitored in the hospital. Follow these instructions at home: Eating and drinking  Eat a healthy diet. A healthy diet includes fresh fruits and vegetables, whole grains, healthy fats, and lean proteins. If told, eat more foods that contain a lot of potassium. These include: Nuts, such as peanuts and pistachios. Seeds, such as sunflower seeds and pumpkin seeds. Peas, lentils, and lima beans. Whole grain and bran cereals  and breads. Fresh fruits and vegetables, such as apricots, avocado, bananas, cantaloupe, kiwi, oranges, tomatoes, asparagus, and potatoes. Juices, such as orange, tomato, and prune. Lean meats, including fish. Milk and milk products, such as yogurt. General instructions Take over-the-counter and prescription medicines only as told by your health care provider. This includes vitamins, natural food products, and supplements. Keep all follow-up visits. This is important. Contact a health care provider if: You have weakness that gets worse. You feel your heart pounding or racing. You vomit. You have diarrhea. You have diabetes and you have trouble keeping your blood sugar in your target range. Get help right away if: You have chest pain. You have shortness of breath. You have vomiting or diarrhea that lasts for more than 2 days. You faint. These symptoms may be an emergency. Get help right away. Call 911. Do not wait to see if the symptoms will go away. Do not drive yourself to the hospital. Summary Hypokalemia means that the amount of potassium in the blood is lower than normal. This condition is diagnosed with a blood test. Hypokalemia may be treated by taking potassium supplements, adjusting the medicines that you take, or eating more foods that are high in potassium. If your potassium level is very low, you may need to get potassium through an IV and be monitored in the hospital. This information is not intended to replace advice given to you by your health care provider. Make sure you discuss any questions you have with your health care provider. Document Revised: 12/07/2020 Document Reviewed: 12/07/2020 Elsevier Patient Education  [employer].

## 2021-11-14 ENCOUNTER — Other Ambulatory Visit: Payer: Self-pay | Admitting: Hematology and Oncology

## 2021-11-14 ENCOUNTER — Other Ambulatory Visit: Payer: Self-pay

## 2021-11-14 ENCOUNTER — Encounter: Payer: Self-pay | Admitting: Oncology

## 2021-11-15 ENCOUNTER — Ambulatory Visit: Payer: Self-pay

## 2021-11-16 ENCOUNTER — Ambulatory Visit: Payer: Self-pay

## 2021-11-19 ENCOUNTER — Other Ambulatory Visit: Payer: Self-pay

## 2021-11-20 ENCOUNTER — Other Ambulatory Visit: Payer: Medicare HMO

## 2021-11-20 ENCOUNTER — Ambulatory Visit: Payer: Medicare HMO

## 2021-11-20 ENCOUNTER — Other Ambulatory Visit: Payer: Self-pay | Admitting: Pharmacist

## 2021-11-20 ENCOUNTER — Ambulatory Visit: Payer: Self-pay | Admitting: Hematology and Oncology

## 2021-11-21 ENCOUNTER — Other Ambulatory Visit: Payer: Self-pay

## 2021-11-27 ENCOUNTER — Other Ambulatory Visit: Payer: Self-pay | Admitting: Pharmacist

## 2021-11-27 ENCOUNTER — Encounter: Payer: Self-pay | Admitting: Hematology and Oncology

## 2021-11-27 ENCOUNTER — Inpatient Hospital Stay: Payer: Medicare HMO

## 2021-11-27 ENCOUNTER — Ambulatory Visit: Payer: Self-pay | Admitting: Hematology and Oncology

## 2021-11-27 ENCOUNTER — Ambulatory Visit: Payer: Self-pay

## 2021-11-27 ENCOUNTER — Inpatient Hospital Stay (INDEPENDENT_AMBULATORY_CARE_PROVIDER_SITE_OTHER): Payer: Medicare HMO | Admitting: Hematology and Oncology

## 2021-11-27 VITALS — BP 128/84 | HR 78 | Resp 16

## 2021-11-27 DIAGNOSIS — C50411 Malignant neoplasm of upper-outer quadrant of right female breast: Secondary | ICD-10-CM | POA: Diagnosis not present

## 2021-11-27 DIAGNOSIS — E876 Hypokalemia: Secondary | ICD-10-CM

## 2021-11-27 DIAGNOSIS — Z170 Estrogen receptor positive status [ER+]: Secondary | ICD-10-CM | POA: Diagnosis not present

## 2021-11-27 DIAGNOSIS — E860 Dehydration: Secondary | ICD-10-CM

## 2021-11-27 DIAGNOSIS — R197 Diarrhea, unspecified: Secondary | ICD-10-CM

## 2021-11-27 LAB — BASIC METABOLIC PANEL
BUN: 4 (ref 4–21)
CO2: 28 — AB (ref 13–22)
Chloride: 103 (ref 99–108)
Creatinine: 0.4 — AB (ref 0.5–1.1)
Glucose: 88
Potassium: 3.3 mEq/L — AB (ref 3.5–5.1)
Sodium: 136 — AB (ref 137–147)

## 2021-11-27 LAB — HEPATIC FUNCTION PANEL
ALT: 45 U/L — AB (ref 7–35)
AST: 178 — AB (ref 13–35)
Alkaline Phosphatase: 344 — AB (ref 25–125)
Bilirubin, Total: 1.3

## 2021-11-27 LAB — CBC: RBC: 3.15 — AB (ref 3.87–5.11)

## 2021-11-27 LAB — CBC AND DIFFERENTIAL
HCT: 30 — AB (ref 36–46)
Hemoglobin: 10.1 — AB (ref 12.0–16.0)
Neutrophils Absolute: 5.99
Platelets: 159 10*3/uL (ref 150–400)
WBC: 7.4

## 2021-11-27 LAB — COMPREHENSIVE METABOLIC PANEL
Albumin: 2.4 — AB (ref 3.5–5.0)
Calcium: 7.8 — AB (ref 8.7–10.7)

## 2021-11-27 MED ORDER — HEPARIN SOD (PORK) LOCK FLUSH 100 UNIT/ML IV SOLN
500.0000 [IU] | Freq: Once | INTRAVENOUS | Status: AC | PRN
  Administered 2021-11-27: 500 [IU]

## 2021-11-27 MED ORDER — SODIUM CHLORIDE 0.9% FLUSH
10.0000 mL | Freq: Once | INTRAVENOUS | Status: AC | PRN
  Administered 2021-11-27: 10 mL

## 2021-11-27 MED ORDER — POTASSIUM CHLORIDE IN NACL 20-0.9 MEQ/L-% IV SOLN
Freq: Once | INTRAVENOUS | Status: AC
  Filled 2021-11-27: qty 1000

## 2021-11-27 MED ORDER — ONDANSETRON HCL 4 MG/2ML IJ SOLN
8.0000 mg | Freq: Once | INTRAMUSCULAR | Status: AC
  Administered 2021-11-27: 8 mg via INTRAVENOUS
  Filled 2021-11-27: qty 4

## 2021-11-27 NOTE — Assessment & Plan Note (Signed)
Stage IB hormone receptor positive breast cancer  diagnosed in February.  She was  teated with mastectomy. Endopredict Epclin score is 4.1 whichis consideredhigh riskcorrelatingwith a 20% risk of recurrence in the next 10 years with hormonal therapy alone. She would have an 8% benefit from chemotherapy.  She has been receiving adjuvant chemotherapy with Adriamycin/cyclophosphamide [facility]).  She completed 3 cycles on July 20 and was admitted the following week with severe mucositis, intractable nausea and vomiting, and severe diarrhea with hypokalemia.  Due to this, we decided to stop the [facility] chemotherapy and hopefully proceed with weekly paclitaxel once she recovered. Unfortunately, she has had more admissions to the hospital, most recently with diagnosis of c-diff. Today, she remains tired and continues to have nausea. Her diarrhea has improved, but still remains. Potassium today is 3.3, so we will send to infusion for IVF with potassium and Zofran. We will hold starting chemo until she has recovered. She will return to clinic in one week for repeat evaluation.

## 2021-11-27 NOTE — Progress Notes (Signed)
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  She was  teated with mastectomy. Endopredict Epclin score is 4.1 which is considered high risk correlating with a 20% risk of recurrence in the next 10 years with hormonal therapy alone.  She would have an 8% benefit from chemotherapy.  She has been receiving adjuvant chemotherapy with Adriamycin/cyclophosphamide [facility]).  She completed 3 cycles on July 20 and was admitted the following week with severe mucositis, intractable nausea and vomiting, and severe diarrhea with hypokalemia.  Due to this, we decided to stop the [facility] chemotherapy and hopefully proceed with weekly paclitaxel once she recovered. Unfortunately, she has had more admissions to the hospital, most recently with diagnosis of c-diff. Today, she remains tired and continues to have nausea. Her diarrhea has improved, but still remains. Potassium today is 3.3, so we will send to infusion for IVF with potassium and Zofran. We will hold starting chemo until she has recovered. She will return to clinic in one week for repeat evaluation.    The patient understands the plans discussed today and is in agreement with them.  She knows to contact our office if she develops concerns prior to her next appointment.    Melodye Ped, NP  [facility] [address] [place] [address] Dept: [phone] Dept Fax: [phone]   Orders Placed This Encounter  Procedures   CBC and differential    This external order was created  through the Results Console.   CBC    This external order was created through the Results Console.   Basic metabolic panel    This external order was created through the Results Console.   Comprehensive metabolic panel    This external order was created through the Results Console.   Hepatic function panel    This external order was created through the Results Console.      CHIEF COMPLAINT:  CC: [name] with history of breast cancer here for hospital follow up  Current Treatment:  Paclitaxel; on hold  INTERVAL HISTORY:  Kellan is here today for repeat clinical assessment. She denies fevers or chills. She denies pain. Her appetite is good. Her weight has been stable.  I have reviewed the past medical history, past surgical history, social history and family history with the patient and they are unchanged from previous note.  ALLERGIES:  has No Known Allergies.  MEDICATIONS:  Current Outpatient Medications  Medication Sig Dispense Refill   HYDROmorphone (DILAUDID) 4 MG tablet 1 tablet as needed Orally every 6 hrs for 30 days     oxyCODONE (ROXICODONE) 15 MG immediate release tablet 1 tablet as needed Orally every 6 hrs for 30 days     pregabalin (LYRICA) 225 MG capsule 1 capsule Orally Twice a day for 30 days     apixaban (ELIQUIS) 5 MG TABS tablet 1 tablet Orally twice a day     BELSOMRA 20 MG TABS Take 1 tablet by mouth at bedtime as needed.     budesonide-formoterol (SYMBICORT) 160-4.5 MCG/ACT inhaler 2 puffs Inhalation Once a  day     busPIRone (BUSPAR) 10 MG tablet Take 15 mg by mouth 2 (two) times daily.     cholestyramine (QUESTRAN) 4 g packet Take 1 packet by mouth 2 (two) times daily.     cyclobenzaprine (FLEXERIL) 10 MG tablet Take 10 mg by mouth 2 (two) times daily as needed.     DIFICID 200 MG TABS tablet Take 200 mg by mouth 2 (two) times daily.     DODEX 1000 MCG/ML injection INJECT 1 ML AS DIRECTED ONCE A MONTH     esomeprazole (NEXIUM) 40 MG capsule Take 40 mg  by mouth daily.     FLUoxetine HCl 60 MG TABS Take 60 mg by mouth daily. 30 tablet 5   levothyroxine (SYNTHROID) 100 MCG tablet 1 tablet in the morning on an empty stomach Orally Once a day     loratadine (CLARITIN) 10 MG tablet Take 10 mg by mouth daily.     LORazepam (ATIVAN) 1 MG tablet 1 tablet as needed Orally Three times a day for 30 days     Multiple Vitamins-Iron (MULTIVITAMINS WITH IRON) TABS tablet Take 1 tablet by mouth every morning.      norethindrone (MICRONOR) 0.35 MG tablet 1 tablet Orally Once a day     ondansetron (ZOFRAN-ODT) 4 MG disintegrating tablet 1 tablet on the tongue and allow to dissolve Orally Every 4 hours     potassium chloride SA (KLOR-CON M) 20 MEQ tablet Take 20 mEq by mouth 2 (two) times daily.     prochlorperazine (COMPAZINE) 10 MG tablet Take 1 tablet (10 mg total) by mouth every 6 (six) hours as needed for nausea or vomiting. 90 tablet 3   traZODone (DESYREL) 100 MG tablet 1 tablet at bedtime as needed Orally Once a day for 30 days     No current facility-administered medications for this visit.   Facility-Administered Medications Ordered in Other Visits  Medication Dose Route Frequency Provider Last Rate Last Admin   0.9 % NaCl with KCl 20 mEq/ L  infusion   Intravenous Once [place] Scrape A, NP 500 mL/hr at 11/27/21 1428 New Bag at 11/27/21 1428   heparin lock flush 100 unit/mL  500 Units Intracatheter Once PRN [place] Scrape A, NP       sodium chloride flush (NS) 0.9 % injection 10 mL  10 mL Intracatheter Once PRN Melodye Ped, NP        HISTORY OF PRESENT ILLNESS:   Oncology History  Malignant neoplasm of upper-outer quadrant of right breast in female, estrogen receptor positive ([place])  05/14/2021 Initial Diagnosis   Breast cancer of upper-outer quadrant of right female breast ([place])   05/25/2021 Cancer Staging   Staging form: Breast, AJCC 8th Edition - Clinical stage from 05/25/2021: Stage IB (cT1c, cN1(sn), cM0, G2, ER+, PR+, HER2-) -  Signed by [name], MD on 06/17/2021 Histopathologic type: Lobular carcinoma, NOS Stage prefix: Initial diagnosis Method of lymph node assessment: Sentinel lymph node biopsy Nuclear grade: G2 Histologic grading system: 3 grade system Laterality: Right Tumor size (mm): 20 Lymph-vascular invasion (LVI): LVI not present (absent)/not identified Diagnostic confirmation: Positive histology Specimen type: Excision Staged by: Managing physician Menopausal status: Postmenopausal Ki-67 (%): 20 Stage used in treatment planning: Yes National guidelines used in treatment planning: Yes Type of national guideline used in treatment planning: NCCN   06/26/2021 Imaging   CT chest, abdomen, pelvis  IMPRESSION:  1. Postsurgical change in the RIGHT chest wall consistent with  mastectomy and probable  RIGHT axillary nodal dissection. Small fluid  collection in the RIGHT axilla likely represents a postsurgical  seroma.  2. No evidence of lymphadenopathy in the mediastinum.  3. No evidence of pulmonary metastasis. Subpleural reticulation is  favored benign. Branching nodular pattern in the LEFT upper lobe is  favored post infectious or inflammatory.  4. No evidence of metastasis in the abdomen pelvis.  5. No evidence skeletal metastasis.    07/23/2021 -  Chemotherapy   Patient is on Treatment Plan : Breast AC q21 Days / PACLitaxel q7d     09/19/2021 Imaging   CT abdomen/pelvis  1. Somewhat amorphous hyperenhancing lesion with overlying capsular  retraction centered in the anterior right lobe of the liver, hepatic  segments V/VIII, measuring 7.3 x 3.1 cm. This appearance has evolved  over multiple sequential prior examinations dating back to  01/06/2020 but is similar in appearance to most recent examination  dated 06/26/2021. This unusual lesion is of uncertain significance  but worrisome in the setting of referable pain and laboratory  abnormalities. Capsular retraction is generally  concerning for  malignancy such as cholangiocarcinoma, however apparent enhancement  characteristics are not typical. Morphology is likewise not typical  for metastasis, although this is a definite differential  consideration in the setting of known breast malignancy. Recommend  contrast enhanced MRI of the liver to further evaluate.  2. No other evidence of metastatic disease in the abdomen or pelvis.  3. Underlying hepatic steatosis.  4. Status post sleeve gastrectomy and cholecystectomy.  5. New, although age indeterminate subtle inferior endplate wedge  deformity of the T9 vertebral body, less than 25% anterior height  loss. Correlate for acute point tenderness.    09/30/2021 Genetic Testing   Negative hereditary cancer genetic testing: no pathogenic variants detected in [facility] CancerNext+RNAinsight Panel.  Report date is September 30, 2021.   The CancerNext gene panel offered by [employer] includes sequencing, rearrangement analysis, and RNA analysis for the following 36 genes:   APC, ATM, AXIN2, BARD1, BMPR1A, BRCA1, BRCA2, BRIP1, CDH1, CDK4, CDKN2A, CHEK2, DICER1, HOXB13, EPCAM, GREM1, MLH1, MSH2, MSH3, MSH6, MUTYH, NBN, NF1, NTHL1, PALB2, PMS2, POLD1, POLE, PTEN, RAD51C, RAD51D, RECQL, SMAD4, SMARCA4, STK11, and TP53.        REVIEW OF SYSTEMS:   Constitutional: Denies fevers, chills or abnormal weight loss Eyes: Denies blurriness of vision Ears, nose, mouth, throat, and face: Denies mucositis or sore throat Respiratory: Denies cough, dyspnea or wheezes Cardiovascular: Denies palpitation, chest discomfort or lower extremity swelling Gastrointestinal:  Denies nausea, heartburn or change in bowel habits Skin: Denies abnormal skin rashes Lymphatics: Denies new lymphadenopathy or easy bruising Neurological:Denies numbness, tingling or new weaknesses Behavioral/Psych: Mood is stable, no new changes  All other systems were reviewed with the patient and are negative.   VITALS:   Blood pressure 130/81, pulse 87, temperature 98.1 F (36.7 C), temperature source Oral, resp. rate 20, height $RemoveBe'3\' 10"'uFltAlocf$  (1.168 m), weight 165 lb 9.6 oz (75.1 kg), SpO2 96 %.  Wt Readings from Last 3 Encounters:  11/27/21 165 lb 9.6 oz (75.1 kg)  10/23/21 159 lb 3.2 oz (72.2 kg)  10/05/21 165 lb (74.8 kg)    Body mass index is 55.02 kg/m.  Performance status (ECOG): 1 - Symptomatic but completely ambulatory  PHYSICAL EXAM:   GENERAL:alert, no distress and comfortable SKIN: skin color, texture, turgor are normal, no rashes or significant lesions EYES: normal, Conjunctiva are pink and non-injected, sclera clear OROPHARYNX:no exudate, no erythema and lips, buccal mucosa, and tongue  normal  NECK: supple, thyroid normal size, non-tender, without nodularity LYMPH:  no palpable lymphadenopathy in the cervical, axillary or inguinal LUNGS: clear to auscultation and percussion with normal breathing effort HEART: regular rate & rhythm and no murmurs and no lower extremity edema ABDOMEN:abdomen soft, non-tender and normal bowel sounds Musculoskeletal:no cyanosis of digits and no clubbing  NEURO: alert & oriented x 3 with fluent speech, no focal motor/sensory deficits  LABORATORY DATA:  I have reviewed the data as listed    Component Value Date/Time   NA 136 (A) 11/27/2021 0000   K 3.3 (A) 11/27/2021 0000   CL 103 11/27/2021 0000   CO2 28 (A) 11/27/2021 0000   GLUCOSE 151 (H) 03/29/2014 0535   BUN 4 11/27/2021 0000   CREATININE 0.4 (A) 11/27/2021 0000   CREATININE 0.55 03/29/2014 0535   CALCIUM 7.8 (A) 11/27/2021 0000   PROT 5.5 (A) 11/13/2021 0000   ALBUMIN 2.4 (A) 11/27/2021 0000   AST 178 (A) 11/27/2021 0000   ALT 45 (A) 11/27/2021 0000   ALKPHOS 344 (A) 11/27/2021 0000   BILITOT 0.4 03/29/2014 0535   GFRNONAA >90 03/29/2014 0535   GFRAA >90 03/29/2014 0535    No results found for: "SPEP", "UPEP"  Lab Results  Component Value Date   WBC 7.4 11/27/2021   NEUTROABS 5.99  11/27/2021   HGB 10.1 (A) 11/27/2021   HCT 30 (A) 11/27/2021   MCV 97 09/24/2021   PLT 159 11/27/2021      Chemistry      Component Value Date/Time   NA 136 (A) 11/27/2021 0000   K 3.3 (A) 11/27/2021 0000   CL 103 11/27/2021 0000   CO2 28 (A) 11/27/2021 0000   BUN 4 11/27/2021 0000   CREATININE 0.4 (A) 11/27/2021 0000   CREATININE 0.55 03/29/2014 0535   GLU 88 11/27/2021 0000      Component Value Date/Time   CALCIUM 7.8 (A) 11/27/2021 0000   ALKPHOS 344 (A) 11/27/2021 0000   AST 178 (A) 11/27/2021 0000   ALT 45 (A) 11/27/2021 0000   BILITOT 0.4 03/29/2014 0535       RADIOGRAPHIC STUDIES: I have personally reviewed the radiological images as listed and agreed with the findings in the report. No results found.

## 2021-11-28 ENCOUNTER — Encounter: Payer: Self-pay | Admitting: Hematology and Oncology

## 2021-11-29 ENCOUNTER — Ambulatory Visit: Payer: Medicare HMO

## 2021-11-29 ENCOUNTER — Inpatient Hospital Stay: Payer: Medicare HMO

## 2021-11-29 VITALS — BP 126/80 | HR 79 | Temp 97.8°F | Resp 18

## 2021-11-29 DIAGNOSIS — E876 Hypokalemia: Secondary | ICD-10-CM

## 2021-11-29 DIAGNOSIS — E860 Dehydration: Secondary | ICD-10-CM

## 2021-11-29 DIAGNOSIS — R197 Diarrhea, unspecified: Secondary | ICD-10-CM

## 2021-11-29 MED ORDER — SODIUM CHLORIDE 0.9% FLUSH
10.0000 mL | Freq: Once | INTRAVENOUS | Status: AC | PRN
  Administered 2021-11-29: 10 mL

## 2021-11-29 MED ORDER — POTASSIUM CHLORIDE IN NACL 20-0.9 MEQ/L-% IV SOLN
Freq: Once | INTRAVENOUS | Status: AC
  Filled 2021-11-29: qty 1000

## 2021-11-29 MED ORDER — HEPARIN SOD (PORK) LOCK FLUSH 100 UNIT/ML IV SOLN
500.0000 [IU] | Freq: Once | INTRAVENOUS | Status: AC | PRN
  Administered 2021-11-29: 500 [IU]

## 2021-11-29 NOTE — Patient Instructions (Signed)
Hypokalemia Hypokalemia means that the amount of potassium in the blood is lower than normal. Potassium is a mineral (electrolyte) that helps regulate the amount of fluid in the body. It also stimulates muscle tightening (contraction) and helps nerves work properly. Normally, most of the body's potassium is inside cells, and only a very small amount is in the blood. Because the amount in the blood is so small, minor changes to potassium levels in the blood can be life-threatening. What are the causes? This condition may be caused by: Antibiotic medicine. Diarrhea or vomiting. Taking too much of a medicine that helps you have a bowel movement (laxative) can cause diarrhea and lead to hypokalemia. Chronic kidney disease (CKD). Medicines that help the body get rid of excess fluid (diuretics). Eating disorders, such as anorexia or bulimia. Low magnesium levels in the body. Sweating a lot. What are the signs or symptoms? Symptoms of this condition include: Weakness. Constipation. Fatigue. Muscle cramps. Mental confusion. Skipped heartbeats or irregular heartbeat (palpitations). Tingling or numbness. How is this diagnosed? This condition is diagnosed with a blood test. How is this treated? This condition may be treated by: Taking potassium supplements. Adjusting the medicines that you take. Eating more foods that contain a lot of potassium. If your potassium level is very low, you may need to get potassium through an IV and be monitored in the hospital. Follow these instructions at home: Eating and drinking  Eat a healthy diet. A healthy diet includes fresh fruits and vegetables, whole grains, healthy fats, and lean proteins. If told, eat more foods that contain a lot of potassium. These include: Nuts, such as peanuts and pistachios. Seeds, such as sunflower seeds and pumpkin seeds. Peas, lentils, and lima beans. Whole grain and bran cereals and breads. Fresh fruits and vegetables,  such as apricots, avocado, bananas, cantaloupe, kiwi, oranges, tomatoes, asparagus, and potatoes. Juices, such as orange, tomato, and prune. Lean meats, including fish. Milk and milk products, such as yogurt. General instructions Take over-the-counter and prescription medicines only as told by your health care provider. This includes vitamins, natural food products, and supplements. Keep all follow-up visits. This is important. Contact a health care provider if: You have weakness that gets worse. You feel your heart pounding or racing. You vomit. You have diarrhea. You have diabetes and you have trouble keeping your blood sugar in your target range. Get help right away if: You have chest pain. You have shortness of breath. You have vomiting or diarrhea that lasts for more than 2 days. You faint. These symptoms may be an emergency. Get help right away. Call 911. Do not wait to see if the symptoms will go away. Do not drive yourself to the hospital. Summary Hypokalemia means that the amount of potassium in the blood is lower than normal. This condition is diagnosed with a blood test. Hypokalemia may be treated by taking potassium supplements, adjusting the medicines that you take, or eating more foods that are high in potassium. If your potassium level is very low, you may need to get potassium through an IV and be monitored in the hospital. This information is not intended to replace advice given to you by your health care provider. Make sure you discuss any questions you have with your health care provider. Document Revised: 12/07/2020 Document Reviewed: 12/07/2020 Elsevier Patient Education  2023 [employer] Inc.  

## 2021-12-05 ENCOUNTER — Inpatient Hospital Stay (HOSPITAL_COMMUNITY)
Admit: 2021-12-05 | Discharge: 2021-12-20 | DRG: 308 | Disposition: A | Discharge status: Home / Self Care | Payer: Medicare HMO | Source: Ambulatory Visit | Attending: Internal Medicine | Admitting: Internal Medicine

## 2021-12-05 ENCOUNTER — Other Ambulatory Visit: Payer: Medicare HMO

## 2021-12-05 ENCOUNTER — Inpatient Hospital Stay (HOSPITAL_COMMUNITY): Payer: Medicare HMO

## 2021-12-05 ENCOUNTER — Inpatient Hospital Stay: Admission: AD | Admit: 2021-12-05 | Payer: Medicare HMO | Source: Other Acute Inpatient Hospital

## 2021-12-05 ENCOUNTER — Encounter (HOSPITAL_COMMUNITY): Payer: Self-pay

## 2021-12-05 ENCOUNTER — Ambulatory Visit: Payer: Medicare HMO | Admitting: Hematology and Oncology

## 2021-12-05 DIAGNOSIS — Z9911 Dependence on respirator [ventilator] status: Secondary | ICD-10-CM | POA: Diagnosis not present

## 2021-12-05 DIAGNOSIS — I248 Other forms of acute ischemic heart disease: Secondary | ICD-10-CM | POA: Diagnosis present

## 2021-12-05 DIAGNOSIS — D539 Nutritional anemia, unspecified: Secondary | ICD-10-CM | POA: Diagnosis not present

## 2021-12-05 DIAGNOSIS — Z87891 Personal history of nicotine dependence: Secondary | ICD-10-CM

## 2021-12-05 DIAGNOSIS — I4729 Other ventricular tachycardia: Secondary | ICD-10-CM | POA: Diagnosis not present

## 2021-12-05 DIAGNOSIS — R570 Cardiogenic shock: Secondary | ICD-10-CM | POA: Diagnosis present

## 2021-12-05 DIAGNOSIS — J9601 Acute respiratory failure with hypoxia: Secondary | ICD-10-CM

## 2021-12-05 DIAGNOSIS — Z803 Family history of malignant neoplasm of breast: Secondary | ICD-10-CM

## 2021-12-05 DIAGNOSIS — R6521 Severe sepsis with septic shock: Secondary | ICD-10-CM | POA: Diagnosis present

## 2021-12-05 DIAGNOSIS — I4901 Ventricular fibrillation: Secondary | ICD-10-CM | POA: Diagnosis present

## 2021-12-05 DIAGNOSIS — I10 Essential (primary) hypertension: Secondary | ICD-10-CM | POA: Diagnosis present

## 2021-12-05 DIAGNOSIS — Z170 Estrogen receptor positive status [ER+]: Secondary | ICD-10-CM

## 2021-12-05 DIAGNOSIS — C50411 Malignant neoplasm of upper-outer quadrant of right female breast: Secondary | ICD-10-CM

## 2021-12-05 DIAGNOSIS — E876 Hypokalemia: Secondary | ICD-10-CM | POA: Diagnosis present

## 2021-12-05 DIAGNOSIS — Z9011 Acquired absence of right breast and nipple: Secondary | ICD-10-CM

## 2021-12-05 DIAGNOSIS — Z8249 Family history of ischemic heart disease and other diseases of the circulatory system: Secondary | ICD-10-CM

## 2021-12-05 DIAGNOSIS — I462 Cardiac arrest due to underlying cardiac condition: Secondary | ICD-10-CM | POA: Diagnosis present

## 2021-12-05 DIAGNOSIS — I4581 Long QT syndrome: Secondary | ICD-10-CM

## 2021-12-05 DIAGNOSIS — A419 Sepsis, unspecified organism: Secondary | ICD-10-CM | POA: Diagnosis present

## 2021-12-05 DIAGNOSIS — I469 Cardiac arrest, cause unspecified: Principal | ICD-10-CM

## 2021-12-05 DIAGNOSIS — E8809 Other disorders of plasma-protein metabolism, not elsewhere classified: Secondary | ICD-10-CM | POA: Diagnosis present

## 2021-12-05 DIAGNOSIS — M96A3 Multiple fractures of ribs associated with chest compression and cardiopulmonary resuscitation: Secondary | ICD-10-CM | POA: Diagnosis present

## 2021-12-05 DIAGNOSIS — E872 Acidosis, unspecified: Secondary | ICD-10-CM | POA: Diagnosis present

## 2021-12-05 DIAGNOSIS — Z89611 Acquired absence of right leg above knee: Secondary | ICD-10-CM

## 2021-12-05 DIAGNOSIS — E039 Hypothyroidism, unspecified: Secondary | ICD-10-CM | POA: Diagnosis present

## 2021-12-05 DIAGNOSIS — R7401 Elevation of levels of liver transaminase levels: Secondary | ICD-10-CM | POA: Diagnosis not present

## 2021-12-05 DIAGNOSIS — I428 Other cardiomyopathies: Secondary | ICD-10-CM | POA: Diagnosis not present

## 2021-12-05 DIAGNOSIS — Z86711 Personal history of pulmonary embolism: Secondary | ICD-10-CM | POA: Diagnosis present

## 2021-12-05 DIAGNOSIS — E871 Hypo-osmolality and hyponatremia: Secondary | ICD-10-CM | POA: Diagnosis present

## 2021-12-05 DIAGNOSIS — Z6841 Body Mass Index (BMI) 40.0 and over, adult: Secondary | ICD-10-CM

## 2021-12-05 DIAGNOSIS — D696 Thrombocytopenia, unspecified: Secondary | ICD-10-CM | POA: Diagnosis present

## 2021-12-05 DIAGNOSIS — A0472 Enterocolitis due to Clostridium difficile, not specified as recurrent: Secondary | ICD-10-CM | POA: Diagnosis present

## 2021-12-05 DIAGNOSIS — Z89612 Acquired absence of left leg above knee: Secondary | ICD-10-CM | POA: Diagnosis not present

## 2021-12-05 DIAGNOSIS — Z9221 Personal history of antineoplastic chemotherapy: Secondary | ICD-10-CM

## 2021-12-05 DIAGNOSIS — Z807 Family history of other malignant neoplasms of lymphoid, hematopoietic and related tissues: Secondary | ICD-10-CM

## 2021-12-05 DIAGNOSIS — J690 Pneumonitis due to inhalation of food and vomit: Secondary | ICD-10-CM

## 2021-12-05 DIAGNOSIS — E162 Hypoglycemia, unspecified: Secondary | ICD-10-CM | POA: Diagnosis not present

## 2021-12-05 DIAGNOSIS — R197 Diarrhea, unspecified: Secondary | ICD-10-CM | POA: Diagnosis not present

## 2021-12-05 DIAGNOSIS — D638 Anemia in other chronic diseases classified elsewhere: Secondary | ICD-10-CM | POA: Diagnosis present

## 2021-12-05 DIAGNOSIS — I959 Hypotension, unspecified: Secondary | ICD-10-CM | POA: Diagnosis not present

## 2021-12-05 DIAGNOSIS — Z79890 Hormone replacement therapy (postmenopausal): Secondary | ICD-10-CM

## 2021-12-05 DIAGNOSIS — Z79899 Other long term (current) drug therapy: Secondary | ICD-10-CM

## 2021-12-05 DIAGNOSIS — Z9884 Bariatric surgery status: Secondary | ICD-10-CM

## 2021-12-05 DIAGNOSIS — G4733 Obstructive sleep apnea (adult) (pediatric): Secondary | ICD-10-CM | POA: Diagnosis present

## 2021-12-05 DIAGNOSIS — R748 Abnormal levels of other serum enzymes: Secondary | ICD-10-CM | POA: Diagnosis present

## 2021-12-05 DIAGNOSIS — Z89619 Acquired absence of unspecified leg above knee: Secondary | ICD-10-CM

## 2021-12-05 DIAGNOSIS — Z8674 Personal history of sudden cardiac arrest: Secondary | ICD-10-CM | POA: Diagnosis present

## 2021-12-05 DIAGNOSIS — Z7951 Long term (current) use of inhaled steroids: Secondary | ICD-10-CM

## 2021-12-05 DIAGNOSIS — Z86718 Personal history of other venous thrombosis and embolism: Secondary | ICD-10-CM

## 2021-12-05 DIAGNOSIS — R7989 Other specified abnormal findings of blood chemistry: Secondary | ICD-10-CM | POA: Diagnosis not present

## 2021-12-05 DIAGNOSIS — Z7901 Long term (current) use of anticoagulants: Secondary | ICD-10-CM

## 2021-12-05 DIAGNOSIS — E0781 Sick-euthyroid syndrome: Secondary | ICD-10-CM | POA: Diagnosis present

## 2021-12-05 LAB — CBC
HCT: 25.7 % — ABNORMAL LOW (ref 36.0–46.0)
Hemoglobin: 8.8 g/dL — ABNORMAL LOW (ref 12.0–15.0)
MCH: 33.6 pg (ref 26.0–34.0)
MCHC: 34.2 g/dL (ref 30.0–36.0)
MCV: 98.1 fL (ref 80.0–100.0)
Platelets: 144 10*3/uL — ABNORMAL LOW (ref 150–400)
RBC: 2.62 MIL/uL — ABNORMAL LOW (ref 3.87–5.11)
RDW: 20 % — ABNORMAL HIGH (ref 11.5–15.5)
WBC: 12.4 10*3/uL — ABNORMAL HIGH (ref 4.0–10.5)
nRBC: 0 % (ref 0.0–0.2)

## 2021-12-05 LAB — APTT: aPTT: 98 seconds — ABNORMAL HIGH (ref 24–36)

## 2021-12-05 LAB — COMPREHENSIVE METABOLIC PANEL
ALT: 47 U/L — ABNORMAL HIGH (ref 0–44)
AST: 148 U/L — ABNORMAL HIGH (ref 15–41)
Albumin: 1.7 g/dL — ABNORMAL LOW (ref 3.5–5.0)
Alkaline Phosphatase: 248 U/L — ABNORMAL HIGH (ref 38–126)
Anion gap: 13 (ref 5–15)
BUN: <5 mg/dL — ABNORMAL LOW (ref 6–20)
CO2: 20 mmol/L — ABNORMAL LOW (ref 22–32)
Calcium: 7.7 mg/dL — ABNORMAL LOW (ref 8.9–10.3)
Chloride: 99 mmol/L (ref 98–111)
Creatinine, Ser: 0.92 mg/dL (ref 0.44–1.00)
GFR, Estimated: >60 mL/min (ref >60)
Glucose, Bld: 120 mg/dL — ABNORMAL HIGH (ref 70–99)
Potassium: 2.9 mmol/L — ABNORMAL LOW (ref 3.5–5.1)
Sodium: 132 mmol/L — ABNORMAL LOW (ref 135–145)
Total Bilirubin: 1.1 mg/dL (ref 0.3–1.2)
Total Protein: 4.8 g/dL — ABNORMAL LOW (ref 6.5–8.1)

## 2021-12-05 LAB — POCT I-STAT 7, (LYTES, BLD GAS, ICA,H+H)
Acid-base deficit: 3 mmol/L — ABNORMAL HIGH (ref 0.0–2.0)
Bicarbonate: 22.8 mmol/L (ref 20.0–28.0)
Calcium, Ion: 0.99 mmol/L — ABNORMAL LOW (ref 1.15–1.40)
HCT: 31 % — ABNORMAL LOW (ref 36.0–46.0)
Hemoglobin: 10.5 g/dL — ABNORMAL LOW (ref 12.0–15.0)
O2 Saturation: 98 %
Potassium: 2.9 mmol/L — ABNORMAL LOW (ref 3.5–5.1)
Sodium: 134 mmol/L — ABNORMAL LOW (ref 135–145)
TCO2: 24 mmol/L (ref 22–32)
pCO2 arterial: 40.8 mmHg (ref 32–48)
pH, Arterial: 7.356 (ref 7.35–7.45)
pO2, Arterial: 107 mmHg (ref 83–108)

## 2021-12-05 LAB — HEPARIN LEVEL (UNFRACTIONATED): Heparin Unfractionated: >1.1 IU/mL — ABNORMAL HIGH (ref 0.30–0.70)

## 2021-12-05 LAB — HEMOGLOBIN A1C
Hgb A1c MFr Bld: 4.1 % — ABNORMAL LOW (ref 4.8–5.6)
Mean Plasma Glucose: 70.97 mg/dL

## 2021-12-05 LAB — TSH: TSH: 139.676 u[IU]/mL — ABNORMAL HIGH (ref 0.350–4.500)

## 2021-12-05 LAB — LACTIC ACID, PLASMA: Lactic Acid, Venous: 7.7 mmol/L (ref 0.5–1.9)

## 2021-12-05 LAB — MRSA NEXT GEN BY PCR, NASAL: MRSA by PCR Next Gen: DETECTED — AB

## 2021-12-05 LAB — TROPONIN I (HIGH SENSITIVITY): Troponin I (High Sensitivity): 1771 ng/L (ref <18)

## 2021-12-05 LAB — GLUCOSE, CAPILLARY
Glucose-Capillary: 165 mg/dL — ABNORMAL HIGH (ref 70–99)
Glucose-Capillary: 113 mg/dL — ABNORMAL HIGH (ref 70–99)

## 2021-12-05 MED ORDER — FENTANYL CITRATE PF 50 MCG/ML IJ SOSY
50.0000 ug | PREFILLED_SYRINGE | Freq: Once | INTRAMUSCULAR | Status: DC
  Filled 2021-12-05: qty 1

## 2021-12-05 MED ORDER — POLYETHYLENE GLYCOL 3350 17 G PO PACK
17.0000 g | PACK | Freq: Every day | ORAL | Status: DC
  Administered 2021-12-05 – 2021-12-06 (×2): 17 g
  Filled 2021-12-05 (×2): qty 1

## 2021-12-05 MED ORDER — POTASSIUM CHLORIDE 20 MEQ PO PACK
40.0000 meq | PACK | Freq: Once | ORAL | Status: AC
  Administered 2021-12-05: 40 meq
  Filled 2021-12-05: qty 2

## 2021-12-05 MED ORDER — HEPARIN BOLUS VIA INFUSION
2500.0000 [IU] | Freq: Once | INTRAVENOUS | Status: AC
  Administered 2021-12-05: 2500 [IU] via INTRAVENOUS
  Filled 2021-12-05: qty 2500

## 2021-12-05 MED ORDER — ACETAMINOPHEN 650 MG RE SUPP: 650.0000 mg | RECTAL | Status: AC

## 2021-12-05 MED ORDER — HEPARIN (PORCINE) 25000 UT/250ML-% IV SOLN
650.0000 [IU]/h | INTRAVENOUS | Status: DC
  Administered 2021-12-05: 1050 [IU]/h via INTRAVENOUS
  Filled 2021-12-05 (×2): qty 250

## 2021-12-05 MED ORDER — BUSPIRONE HCL 15 MG PO TABS
30.0000 mg | ORAL_TABLET | Freq: Three times a day (TID) | ORAL | Status: DC | PRN
Start: 2021-12-05 — End: 2021-12-06

## 2021-12-05 MED ORDER — DOCUSATE SODIUM 50 MG/5ML PO LIQD
100.0000 mg | Freq: Two times a day (BID) | ORAL | Status: DC
  Administered 2021-12-05 – 2021-12-06 (×2): 100 mg
  Filled 2021-12-05 (×3): qty 10

## 2021-12-05 MED ORDER — ACETAMINOPHEN 325 MG PO TABS
650.0000 mg | ORAL_TABLET | ORAL | Status: AC
  Administered 2021-12-06 – 2021-12-07 (×3): 650 mg via ORAL
  Filled 2021-12-05 (×3): qty 2

## 2021-12-05 MED ORDER — IOHEXOL 350 MG/ML SOLN
100.0000 mL | Freq: Once | INTRAVENOUS | Status: AC | PRN
  Administered 2021-12-05: 100 mL via INTRAVENOUS

## 2021-12-05 MED ORDER — FIDAXOMICIN 200 MG PO TABS
200.0000 mg | ORAL_TABLET | Freq: Two times a day (BID) | ORAL | Status: DC
  Administered 2021-12-05: 200 mg via ORAL
  Filled 2021-12-05: qty 1

## 2021-12-05 MED ORDER — PROPOFOL 1000 MG/100ML IV EMUL
0.0000 ug/kg/min | INTRAVENOUS | Status: DC
  Administered 2021-12-05: 5 ug/kg/min via INTRAVENOUS
  Administered 2021-12-06: 20 ug/kg/min via INTRAVENOUS
  Filled 2021-12-05 (×3): qty 100

## 2021-12-05 MED ORDER — SODIUM CHLORIDE 0.9 % IV SOLN
250.0000 mL | INTRAVENOUS | Status: DC
  Administered 2021-12-05 – 2021-12-07 (×2): 250 mL via INTRAVENOUS

## 2021-12-05 MED ORDER — NOREPINEPHRINE 4 MG/250ML-% IV SOLN
2.0000 ug/min | INTRAVENOUS | Status: DC
  Administered 2021-12-05: 2 ug/min via INTRAVENOUS
  Filled 2021-12-05: qty 250

## 2021-12-05 MED ORDER — PANTOPRAZOLE 2 MG/ML SUSPENSION
40.0000 mg | Freq: Every day | ORAL | Status: DC
  Administered 2021-12-05 – 2021-12-06 (×2): 40 mg
  Filled 2021-12-05 (×4): qty 20

## 2021-12-05 MED ORDER — ACETAMINOPHEN 160 MG/5ML PO SOLN
650.0000 mg | ORAL | Status: AC
  Administered 2021-12-05 – 2021-12-07 (×6): 650 mg
  Filled 2021-12-05 (×6): qty 20.3

## 2021-12-05 MED ORDER — VITAL HIGH PROTEIN PO LIQD
1000.0000 mL | ORAL | Status: DC
  Administered 2021-12-05: 1000 mL

## 2021-12-05 MED ORDER — FENTANYL BOLUS VIA INFUSION
50.0000 ug | INTRAVENOUS | Status: DC | PRN
  Administered 2021-12-06: 100 ug via INTRAVENOUS
  Administered 2021-12-06: 50 ug via INTRAVENOUS
  Administered 2021-12-06: 100 ug via INTRAVENOUS

## 2021-12-05 MED ORDER — INSULIN ASPART 100 UNIT/ML IJ SOLN
0.0000 [IU] | INTRAMUSCULAR | Status: DC
  Administered 2021-12-05: 2 [IU] via SUBCUTANEOUS
  Administered 2021-12-06 – 2021-12-07 (×6): 1 [IU] via SUBCUTANEOUS

## 2021-12-05 MED ORDER — ACETAMINOPHEN 160 MG/5ML PO SOLN: 650.0000 mg | ORAL | Status: DC | PRN

## 2021-12-05 MED ORDER — PROSOURCE TF20 ENFIT COMPATIBL EN LIQD
60.0000 mL | Freq: Every day | ENTERAL | Status: DC
  Administered 2021-12-05 – 2021-12-06 (×2): 60 mL
  Filled 2021-12-05 (×2): qty 60

## 2021-12-05 MED ORDER — CALCIUM GLUCONATE-NACL 1-0.675 GM/50ML-% IV SOLN
1.0000 g | Freq: Once | INTRAVENOUS | Status: AC
Start: 2021-12-05 — End: 2021-12-05
  Administered 2021-12-05: 1000 mg via INTRAVENOUS
  Filled 2021-12-05: qty 50

## 2021-12-05 MED ORDER — ACETAMINOPHEN 325 MG PO TABS
650.0000 mg | ORAL_TABLET | ORAL | Status: DC | PRN
Start: 2021-12-07 — End: 2021-12-07

## 2021-12-05 MED ORDER — ACETAMINOPHEN 650 MG RE SUPP: 650.0000 mg | RECTAL | Status: DC | PRN

## 2021-12-05 MED ORDER — MAGNESIUM SULFATE 2 GM/50ML IV SOLN
2.0000 g | Freq: Once | INTRAVENOUS | Status: DC | PRN
  Administered 2021-12-05: 2 g via INTRAVENOUS
  Filled 2021-12-05: qty 50

## 2021-12-05 MED ORDER — FENTANYL 2500MCG IN NS 250ML (10MCG/ML) PREMIX INFUSION
50.0000 ug/h | INTRAVENOUS | Status: DC
  Administered 2021-12-06: 50 ug/h via INTRAVENOUS
  Filled 2021-12-05: qty 250

## 2021-12-05 MED ORDER — ONDANSETRON HCL 4 MG/2ML IJ SOLN: 4.0000 mg | Freq: Four times a day (QID) | INTRAMUSCULAR | Status: DC | PRN

## 2021-12-05 MED ORDER — BUSPIRONE HCL 15 MG PO TABS: 30.0000 mg | ORAL_TABLET | Freq: Three times a day (TID) | ORAL | Status: DC | PRN

## 2021-12-05 NOTE — Progress Notes (Signed)
Date and time results received: 12/05/21 2249   Test: Troponin Critical Value: 1771  Name of Provider Notified: Oletta Darter, MD  Orders Received? Or Actions Taken?:  awaiting orders

## 2021-12-05 NOTE — Progress Notes (Signed)
A consult was placed to IV Therapy to place an ultrasound guided PIV for vasopressors;  Spoke with Alroy Dust, RN,  caring for the pt , who says the pt has adequate access at this time, including a port, and also an IO;  Will hold USPIV at this time; please re-consult IV Team if more access is needed.  Thank you!
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
## 2021-12-05 NOTE — Progress Notes (Addendum)
eLink Physician-Brief Progress Note Patient Name: Mallory Ayers DOB: [birth year] MRN: [id number]   Date of Service  12/05/2021  HPI/Events of Note  Multiple issues: 1. Lactic Acid #1 = 7.7 in setting of post cardiac arrest. 2. Daughter questions if UDS sent at [facility] 3.  Chest CTA negative for PE. Nursing question if Heparin IV infusion still required. Indication for Heparin IV infusion appears to be ACS/STEMI.   eICU Interventions  Plan: Continue to trend Lactic Acid.  Urine drug screen now. Continue Heparin IV infusion.      Intervention Category Major Interventions: Acid-Base disturbance - evaluation and management;Other:  Lysle Dingwall 12/05/2021, 10:43 PM

## 2021-12-05 NOTE — Progress Notes (Signed)
Date and time results received: 12/05/21 2215   Test: LA Critical Value: 7.7  Name of Provider Notified: Oletta Darter, MD  Orders Received? Or Actions Taken?:  awaiting orders

## 2021-12-05 NOTE — Progress Notes (Signed)
Pt transported from 2M08 on vent to CT. Pt transported back tom room. No complications. Pt on PRVC settings. Will continue to monitor.

## 2021-12-06 ENCOUNTER — Inpatient Hospital Stay (HOSPITAL_COMMUNITY): Payer: Medicare HMO

## 2021-12-06 ENCOUNTER — Ambulatory Visit: Payer: Medicare HMO

## 2021-12-06 DIAGNOSIS — J9601 Acute respiratory failure with hypoxia: Secondary | ICD-10-CM

## 2021-12-06 DIAGNOSIS — I469 Cardiac arrest, cause unspecified: Secondary | ICD-10-CM | POA: Diagnosis not present

## 2021-12-06 DIAGNOSIS — I428 Other cardiomyopathies: Secondary | ICD-10-CM

## 2021-12-06 DIAGNOSIS — E876 Hypokalemia: Secondary | ICD-10-CM | POA: Diagnosis not present

## 2021-12-06 LAB — HEPATIC FUNCTION PANEL
ALT: 47 U/L — ABNORMAL HIGH (ref 0–44)
AST: 140 U/L — ABNORMAL HIGH (ref 15–41)
Albumin: 1.7 g/dL — ABNORMAL LOW (ref 3.5–5.0)
Alkaline Phosphatase: 253 U/L — ABNORMAL HIGH (ref 38–126)
Bilirubin, Direct: 0.5 mg/dL — ABNORMAL HIGH (ref 0.0–0.2)
Indirect Bilirubin: 0.7 mg/dL (ref 0.3–0.9)
Total Bilirubin: 1.2 mg/dL (ref 0.3–1.2)
Total Protein: 4.9 g/dL — ABNORMAL LOW (ref 6.5–8.1)

## 2021-12-06 LAB — ECHOCARDIOGRAM COMPLETE
AR max vel: 2.42 cm2
AV Area VTI: 2.23 cm2
AV Area mean vel: 2.27 cm2
AV Mean grad: 5 mmHg
AV Peak grad: 8.1 mmHg
Ao pk vel: 1.42 m/s
Area-P 1/2: 2.18 cm2
Height: 44 in
S' Lateral: 3.9 cm
Weight: 2881.85 oz

## 2021-12-06 LAB — BASIC METABOLIC PANEL
Anion gap: 9 (ref 5–15)
BUN: <5 mg/dL — ABNORMAL LOW (ref 6–20)
CO2: 23 mmol/L (ref 22–32)
Calcium: 7.5 mg/dL — ABNORMAL LOW (ref 8.9–10.3)
Chloride: 105 mmol/L (ref 98–111)
Creatinine, Ser: 0.93 mg/dL (ref 0.44–1.00)
GFR, Estimated: >60 mL/min (ref >60)
Glucose, Bld: 150 mg/dL — ABNORMAL HIGH (ref 70–99)
Potassium: 3.8 mmol/L (ref 3.5–5.1)
Sodium: 137 mmol/L (ref 135–145)

## 2021-12-06 LAB — HEPARIN LEVEL (UNFRACTIONATED)
Heparin Unfractionated: >1.1 IU/mL — ABNORMAL HIGH (ref 0.30–0.70)
Heparin Unfractionated: >1.1 IU/mL — ABNORMAL HIGH (ref 0.30–0.70)
Heparin Unfractionated: >1.1 IU/mL — ABNORMAL HIGH (ref 0.30–0.70)

## 2021-12-06 LAB — RAPID URINE DRUG SCREEN, HOSP PERFORMED
Amphetamines: NOT DETECTED
Barbiturates: NOT DETECTED
Benzodiazepines: POSITIVE — AB
Cocaine: NOT DETECTED
Opiates: POSITIVE — AB
Tetrahydrocannabinol: NOT DETECTED

## 2021-12-06 LAB — GLUCOSE, CAPILLARY
Glucose-Capillary: 139 mg/dL — ABNORMAL HIGH (ref 70–99)
Glucose-Capillary: 127 mg/dL — ABNORMAL HIGH (ref 70–99)
Glucose-Capillary: 129 mg/dL — ABNORMAL HIGH (ref 70–99)
Glucose-Capillary: 123 mg/dL — ABNORMAL HIGH (ref 70–99)
Glucose-Capillary: 95 mg/dL (ref 70–99)
Glucose-Capillary: 127 mg/dL — ABNORMAL HIGH (ref 70–99)

## 2021-12-06 LAB — CBC
HCT: 26.3 % — ABNORMAL LOW (ref 36.0–46.0)
Hemoglobin: 8.9 g/dL — ABNORMAL LOW (ref 12.0–15.0)
MCH: 33.1 pg (ref 26.0–34.0)
MCHC: 33.8 g/dL (ref 30.0–36.0)
MCV: 97.8 fL (ref 80.0–100.0)
Platelets: 180 10*3/uL (ref 150–400)
RBC: 2.69 MIL/uL — ABNORMAL LOW (ref 3.87–5.11)
RDW: 20.6 % — ABNORMAL HIGH (ref 11.5–15.5)
WBC: 16 10*3/uL — ABNORMAL HIGH (ref 4.0–10.5)
nRBC: 0 % (ref 0.0–0.2)

## 2021-12-06 LAB — MAGNESIUM: Magnesium: 2.3 mg/dL (ref 1.7–2.4)

## 2021-12-06 LAB — PHOSPHORUS
Phosphorus: 1.5 mg/dL — ABNORMAL LOW (ref 2.5–4.6)
Phosphorus: 3.8 mg/dL (ref 2.5–4.6)

## 2021-12-06 LAB — TRIGLYCERIDES: Triglycerides: 93 mg/dL (ref <150)

## 2021-12-06 LAB — TROPONIN I (HIGH SENSITIVITY): Troponin I (High Sensitivity): 1789 ng/L (ref <18)

## 2021-12-06 LAB — LACTIC ACID, PLASMA
Lactic Acid, Venous: 5.9 mmol/L (ref 0.5–1.9)
Lactic Acid, Venous: 4.2 mmol/L (ref 0.5–1.9)
Lactic Acid, Venous: 4.2 mmol/L (ref 0.5–1.9)

## 2021-12-06 LAB — T4, FREE: Free T4: 0.4 ng/dL — ABNORMAL LOW (ref 0.61–1.12)

## 2021-12-06 LAB — APTT
aPTT: >200 seconds (ref 24–36)
aPTT: >200 seconds (ref 24–36)
aPTT: 195 seconds (ref 24–36)

## 2021-12-06 LAB — PROCALCITONIN: Procalcitonin: 1.12 ng/mL

## 2021-12-06 MED ORDER — NOREPINEPHRINE 4 MG/250ML-% IV SOLN
0.0000 ug/min | INTRAVENOUS | Status: DC
  Administered 2021-12-06: 10 ug/min via INTRAVENOUS
  Administered 2021-12-06: 11 ug/min via INTRAVENOUS
  Administered 2021-12-06: 8 ug/min via INTRAVENOUS
  Administered 2021-12-06: 14 ug/min via INTRAVENOUS
  Filled 2021-12-06 (×3): qty 250

## 2021-12-06 MED ORDER — LIDOCAINE IN D5W 4-5 MG/ML-% IV SOLN
0.5000 mg/min | INTRAVENOUS | Status: DC
  Administered 2021-12-06 – 2021-12-07 (×2): 1 mg/min via INTRAVENOUS
  Filled 2021-12-06 (×2): qty 500

## 2021-12-06 MED ORDER — ORAL CARE MOUTH RINSE
15.0000 mL | OROMUCOSAL | Status: DC
  Administered 2021-12-06 (×10): 15 mL via OROMUCOSAL

## 2021-12-06 MED ORDER — HEPARIN (PORCINE) 25000 UT/250ML-% IV SOLN
1150.0000 [IU]/h | INTRAVENOUS | Status: DC
  Administered 2021-12-06: 650 [IU]/h via INTRAVENOUS
  Administered 2021-12-08: 900 [IU]/h via INTRAVENOUS
  Administered 2021-12-09: 1150 [IU]/h via INTRAVENOUS
  Filled 2021-12-06 (×2): qty 250

## 2021-12-06 MED ORDER — POTASSIUM PHOSPHATES 15 MMOLE/5ML IV SOLN
45.0000 mmol | Freq: Once | INTRAVENOUS | Status: AC
  Administered 2021-12-06: 45 mmol via INTRAVENOUS
  Filled 2021-12-06: qty 15

## 2021-12-06 MED ORDER — POTASSIUM CHLORIDE 20 MEQ PO PACK
40.0000 meq | PACK | Freq: Once | ORAL | Status: AC
Start: 2021-12-06 — End: 2021-12-06
  Administered 2021-12-06: 40 meq
  Filled 2021-12-06: qty 2

## 2021-12-06 MED ORDER — PREGABALIN 75 MG PO CAPS
225.0000 mg | ORAL_CAPSULE | Freq: Two times a day (BID) | ORAL | Status: DC
  Administered 2021-12-06 – 2021-12-20 (×28): 225 mg via ORAL
  Filled 2021-12-06 (×28): qty 3

## 2021-12-06 MED ORDER — LIDOCAINE 5 % EX PTCH
1.0000 | MEDICATED_PATCH | CUTANEOUS | Status: DC
Start: 2021-12-06 — End: 2021-12-08
  Administered 2021-12-06 – 2021-12-07 (×2): 1 via TRANSDERMAL
  Filled 2021-12-06 (×2): qty 1

## 2021-12-06 MED ORDER — FIDAXOMICIN 200 MG PO TABS
200.0000 mg | ORAL_TABLET | Freq: Two times a day (BID) | ORAL | Status: DC
Start: 2021-12-06 — End: 2021-12-06
  Administered 2021-12-06: 200 mg
  Filled 2021-12-06 (×2): qty 1

## 2021-12-06 MED ORDER — MUPIROCIN 2 % EX OINT
1.0000 | TOPICAL_OINTMENT | Freq: Two times a day (BID) | CUTANEOUS | Status: AC
  Administered 2021-12-06 – 2021-12-10 (×10): 1 via NASAL
  Filled 2021-12-06 (×3): qty 22

## 2021-12-06 MED ORDER — MAGNESIUM SULFATE 4 GM/100ML IV SOLN
4.0000 g | Freq: Once | INTRAVENOUS | Status: AC
  Administered 2021-12-06: 4 g via INTRAVENOUS
  Filled 2021-12-06: qty 100

## 2021-12-06 MED ORDER — PROSOURCE TF20 ENFIT COMPATIBL EN LIQD
60.0000 mL | Freq: Two times a day (BID) | ENTERAL | Status: DC
  Administered 2021-12-06: 60 mL
  Filled 2021-12-06: qty 60

## 2021-12-06 MED ORDER — ADULT MULTIVITAMIN W/MINERALS CH
1.0000 | ORAL_TABLET | Freq: Every day | ORAL | Status: DC
  Administered 2021-12-06 – 2021-12-07 (×2): 1
  Filled 2021-12-06 (×2): qty 1

## 2021-12-06 MED ORDER — LIDOCAINE HCL (CARDIAC) PF 100 MG/5ML IV SOSY
PREFILLED_SYRINGE | INTRAVENOUS | Status: AC
  Filled 2021-12-06: qty 5

## 2021-12-06 MED ORDER — LIDOCAINE HCL (CARDIAC) PF 100 MG/5ML IV SOSY
1.0000 mg/kg | PREFILLED_SYRINGE | Freq: Once | INTRAVENOUS | Status: AC
  Administered 2021-12-06: 80 mg via INTRAVENOUS

## 2021-12-06 MED ORDER — VITAL HIGH PROTEIN PO LIQD
1000.0000 mL | ORAL | Status: DC
  Administered 2021-12-06: 1000 mL

## 2021-12-06 MED ORDER — ASPIRIN 81 MG PO CHEW
81.0000 mg | CHEWABLE_TABLET | Freq: Every day | ORAL | Status: DC
  Administered 2021-12-06 – 2021-12-07 (×2): 81 mg
  Filled 2021-12-06: qty 1

## 2021-12-06 MED ORDER — FENTANYL CITRATE PF 50 MCG/ML IJ SOSY
50.0000 ug | PREFILLED_SYRINGE | Freq: Once | INTRAMUSCULAR | Status: AC
  Administered 2021-12-06: 50 ug via INTRAVENOUS
  Filled 2021-12-06: qty 1

## 2021-12-06 MED ORDER — VITAL HIGH PROTEIN PO LIQD: 1000.0000 mL | ORAL | Status: DC

## 2021-12-06 MED ORDER — SODIUM CHLORIDE 0.9% FLUSH
10.0000 mL | Freq: Two times a day (BID) | INTRAVENOUS | Status: DC
  Administered 2021-12-06 – 2021-12-13 (×16): 10 mL
  Administered 2021-12-14: 20 mL
  Administered 2021-12-14 – 2021-12-20 (×11): 10 mL

## 2021-12-06 MED ORDER — PIPERACILLIN-TAZOBACTAM 3.375 G IVPB
3.3750 g | Freq: Three times a day (TID) | INTRAVENOUS | Status: DC
  Administered 2021-12-06 – 2021-12-08 (×6): 3.375 g via INTRAVENOUS
  Filled 2021-12-06 (×7): qty 50

## 2021-12-06 MED ORDER — CHLORHEXIDINE GLUCONATE CLOTH 2 % EX PADS
6.0000 | MEDICATED_PAD | Freq: Every day | CUTANEOUS | Status: DC
  Administered 2021-12-06 – 2021-12-08 (×2): 6 via TOPICAL

## 2021-12-06 MED ORDER — ORAL CARE MOUTH RINSE: 15.0000 mL | OROMUCOSAL | Status: DC | PRN

## 2021-12-06 MED ORDER — POTASSIUM CHLORIDE 20 MEQ PO PACK: 40.0000 meq | PACK | Freq: Once | ORAL | Status: DC

## 2021-12-06 MED ORDER — ASPIRIN 81 MG PO CHEW
81.0000 mg | CHEWABLE_TABLET | Freq: Every day | ORAL | Status: DC
Start: 2021-12-06 — End: 2021-12-06
  Filled 2021-12-06: qty 1

## 2021-12-06 MED ORDER — PERFLUTREN LIPID MICROSPHERE
1.0000 mL | INTRAVENOUS | Status: AC | PRN
  Administered 2021-12-06: 2.5 mL via INTRAVENOUS

## 2021-12-06 MED ORDER — SODIUM CHLORIDE 0.9% FLUSH: 10.0000 mL | INTRAVENOUS | Status: DC | PRN

## 2021-12-06 MED ORDER — HYDROMORPHONE HCL 2 MG PO TABS
2.0000 mg | ORAL_TABLET | Freq: Four times a day (QID) | ORAL | Status: DC | PRN
  Administered 2021-12-06 – 2021-12-20 (×28): 2 mg via ORAL
  Filled 2021-12-06 (×32): qty 1

## 2021-12-06 MED ORDER — VITAL 1.5 CAL PO LIQD: 1000.0000 mL | ORAL | Status: DC

## 2021-12-06 NOTE — Progress Notes (Signed)
eLink Physician-Brief Progress Note Patient Name: Mallory Ayers DOB: Jul 23, 1970 MRN: [id number]   Date of Service  12/06/2021  HPI/Events of Note  Patient reports no relief with Fentanyl 50 mcg IV X 1 given about 1 hour ago.  eICU Interventions  Plan: Lyrica 225 mg PO now and BID. Dilaudid 2 mg PO now and Q 6 hour PRN severe pain.      Intervention Category Major Interventions: Other:  Lysle Dingwall 12/06/2021, 9:35 PM

## 2021-12-06 NOTE — Progress Notes (Signed)
Pharmacy Electrolyte Replacement  Recent Labs:  Recent Labs    12/06/21 0445  K 3.8  MG 2.3  PHOS 1.5*  CREATININE 0.93    Low Critical Values (K </= 2.5, Phos </= 1, Mg </= 1) Present: Phos = 1.5  MD Contacted: N/A  Plan: Replete phos with 45mol of K phos x 1   HVicenta Dunning PharmD  PGY1 Pharmacy Resident

## 2021-12-06 NOTE — Progress Notes (Signed)
Wasted 50 mL of versed into stericycle bin with Waymon Budge, RN.
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
## 2021-12-06 NOTE — Progress Notes (Signed)
eLink Physician-Brief Progress Note Patient Name: Mallory Ayers DOB: [birth year] MRN: [id number]   Date of Service  12/06/2021  HPI/Events of Note  Multiple issues: 1. Troponin = 1771 in setting of post cardiac arrest and CPR. Patient is already on a Heparin IV infusion. Can't B-Block d/t Norepinephrine IV infusion requirement. 2. Hypokalemia - K+ = 2.9 and Creatinine = 0.92. 3. Hypotension - BP = 74/54 with a MAP = 62.  eICU Interventions  Plan: ASA 81 mg per tube now and Q day.  Will replace K+. Will run Norepinephrine IV infusion via port and change dosing to via central line. Titrate to MAP >= 65.      Intervention Category Major Interventions: Hypotension - evaluation and management;Electrolyte abnormality - evaluation and management;Other:  Lysle Dingwall 12/06/2021, 12:13 AM
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
## 2021-12-06 NOTE — Progress Notes (Signed)
eLink Physician-Brief Progress Note Patient Name: TURA ROLLER DOB: 1970-12-15 MRN: [id number]   Date of Service  12/06/2021  HPI/Events of Note  Notified of PTT > 200. Patient on Heparin IV infusion managed by pharmacy service.   eICU Interventions  Defer Heparin IV infusion rate adjustment to pharmacy service.      Intervention Category Major Interventions: Other:  Hussain Maimone Cornelia Copa 12/06/2021, 6:42 AM

## 2021-12-06 NOTE — Progress Notes (Signed)
eLink Physician-Brief Progress Note Patient Name: Mallory Ayers DOB: 01/04/1971 MRN: [id number]   Date of Service  12/06/2021  HPI/Events of Note  Pain - Nursing request for pain medication.   eICU Interventions  Plan: Fentanyl 50 mcg IV X 1 now.         Calder Oblinger Eugene 12/06/2021, 8:22 PM
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
## 2021-12-06 NOTE — TOC Progression Note (Signed)
Transition of Care [facility]) - Progression Note    Patient Details  Name: Mallory Ayers MRN: [id number] Date of Birth: 10/28/1970  Transition of Care [facility]) CM/SW [place], RN Phone Number:[phone]  12/06/2021, 3:42 PM  Clinical Narrative:     Transition of Care [facility]) Screening Note   Patient Details  Name: Mallory Ayers Date of Birth: 11/05/1970   Transition of Care [facility]) CM/SW Contact:    Angelita Ingles, RN Phone Number: 12/06/2021, 3:43 PM    Transition of Care Department [facility]) has reviewed patient and no TOC needs have been identified at this time. We will continue to monitor patient advancement through interdisciplinary progression rounds.           Expected Discharge Plan and Services                                                 Social Determinants of Health (SDOH) Interventions    Readmission Risk Interventions     No data to display

## 2021-12-06 NOTE — Procedures (Signed)
Extubation Procedure Note  Patient Details:   Name: Mallory Ayers DOB: [birth year] MRN: [id number]   Airway Documentation:    Vent end date: 12/06/21 Vent end time: 1515   Evaluation  O2 sats: stable throughout Complications: No apparent complications Patient did tolerate procedure well. Bilateral Breath Sounds: Clear, Diminished   Yes  RT extubated pt to 4L [place]. Pt tolerated procedure well. No respiratory distress noted.   Alvera Singh 12/06/2021, 3:29 PM

## 2021-12-06 NOTE — Progress Notes (Signed)
Pharmacy Antibiotic Note  Mallory Ayers is a 51 y.o. female admitted on 12/05/2021 with sepsis.  Pharmacy has been consulted for zosyn dosing. Pt has WBC 16.0 and is afebrile.   Plan: Zosyn 3.375g IV q8h (4 hour infusion). F/u cultures, clinical progress, renal function  Height: '3\' 8"'$  (111.8 cm) Weight: 81.7 kg (180 lb 1.9 oz) IBW/kg (Calculated) : 8.7  Temp (24hrs), Avg:98.2 F (36.8 C), Min:97.4 F (36.3 C), Max:99.3 F (37.4 C)  Recent Labs  Lab 12/05/21 2105 12/06/21 0005 12/06/21 0445 12/06/21 1424  WBC 12.4*  --  16.0*  --   CREATININE 0.92  --  0.93  --   LATICACIDVEN 7.7* 5.9*  --  4.2*    Estimated Creatinine Clearance: 43.3 mL/min (by C-G formula based on SCr of 0.93 mg/dL).    No Known Allergies  Antimicrobials this admission: 8/30 fidaxomicin  >> 8/31 8/31 zosyn >>   Dose adjustments this admission:   Microbiology results: 8/30 MRSA PCR: positive  Thank you for allowing pharmacy to be a part of this patient's care. Wilson Singer, PharmD Clinical Pharmacist 12/06/2021 3:54 PM

## 2021-12-07 DIAGNOSIS — J9601 Acute respiratory failure with hypoxia: Secondary | ICD-10-CM | POA: Diagnosis not present

## 2021-12-07 DIAGNOSIS — I469 Cardiac arrest, cause unspecified: Secondary | ICD-10-CM | POA: Diagnosis not present

## 2021-12-07 DIAGNOSIS — I4729 Other ventricular tachycardia: Secondary | ICD-10-CM | POA: Diagnosis not present

## 2021-12-07 LAB — T4: T4, Total: 5.2 ug/dL (ref 4.5–12.0)

## 2021-12-07 LAB — GLUCOSE, CAPILLARY
Glucose-Capillary: 123 mg/dL — ABNORMAL HIGH (ref 70–99)
Glucose-Capillary: 66 mg/dL — ABNORMAL LOW (ref 70–99)
Glucose-Capillary: 68 mg/dL — ABNORMAL LOW (ref 70–99)
Glucose-Capillary: 78 mg/dL (ref 70–99)
Glucose-Capillary: 84 mg/dL (ref 70–99)
Glucose-Capillary: 71 mg/dL (ref 70–99)
Glucose-Capillary: 78 mg/dL (ref 70–99)
Glucose-Capillary: 77 mg/dL (ref 70–99)

## 2021-12-07 LAB — T3, FREE: T3, Free: 2.6 pg/mL (ref 2.0–4.4)

## 2021-12-07 LAB — CBC
HCT: 27.3 % — ABNORMAL LOW (ref 36.0–46.0)
Hemoglobin: 9.4 g/dL — ABNORMAL LOW (ref 12.0–15.0)
MCH: 33.2 pg (ref 26.0–34.0)
MCHC: 34.4 g/dL (ref 30.0–36.0)
MCV: 96.5 fL (ref 80.0–100.0)
Platelets: 129 10*3/uL — ABNORMAL LOW (ref 150–400)
RBC: 2.83 MIL/uL — ABNORMAL LOW (ref 3.87–5.11)
RDW: 20.7 % — ABNORMAL HIGH (ref 11.5–15.5)
WBC: 10.6 10*3/uL — ABNORMAL HIGH (ref 4.0–10.5)
nRBC: 0 % (ref 0.0–0.2)

## 2021-12-07 LAB — APTT
aPTT: 91 seconds — ABNORMAL HIGH (ref 24–36)
aPTT: 122 seconds — ABNORMAL HIGH (ref 24–36)

## 2021-12-07 LAB — BASIC METABOLIC PANEL
Anion gap: 9 (ref 5–15)
BUN: <5 mg/dL — ABNORMAL LOW (ref 6–20)
CO2: 23 mmol/L (ref 22–32)
Calcium: 7.1 mg/dL — ABNORMAL LOW (ref 8.9–10.3)
Chloride: 102 mmol/L (ref 98–111)
Creatinine, Ser: 0.78 mg/dL (ref 0.44–1.00)
GFR, Estimated: >60 mL/min (ref >60)
Glucose, Bld: 106 mg/dL — ABNORMAL HIGH (ref 70–99)
Potassium: 3.4 mmol/L — ABNORMAL LOW (ref 3.5–5.1)
Sodium: 134 mmol/L — ABNORMAL LOW (ref 135–145)

## 2021-12-07 LAB — HEPARIN LEVEL (UNFRACTIONATED)
Heparin Unfractionated: 0.61 IU/mL (ref 0.30–0.70)
Heparin Unfractionated: 0.46 IU/mL (ref 0.30–0.70)

## 2021-12-07 LAB — HEPATIC FUNCTION PANEL
ALT: 48 U/L — ABNORMAL HIGH (ref 0–44)
AST: 157 U/L — ABNORMAL HIGH (ref 15–41)
Albumin: 1.8 g/dL — ABNORMAL LOW (ref 3.5–5.0)
Alkaline Phosphatase: 259 U/L — ABNORMAL HIGH (ref 38–126)
Bilirubin, Direct: 0.8 mg/dL — ABNORMAL HIGH (ref 0.0–0.2)
Indirect Bilirubin: 0.8 mg/dL (ref 0.3–0.9)
Total Bilirubin: 1.6 mg/dL — ABNORMAL HIGH (ref 0.3–1.2)
Total Protein: 5.3 g/dL — ABNORMAL LOW (ref 6.5–8.1)

## 2021-12-07 LAB — LACTIC ACID, PLASMA
Lactic Acid, Venous: 3 mmol/L (ref 0.5–1.9)
Lactic Acid, Venous: 3.5 mmol/L (ref 0.5–1.9)
Lactic Acid, Venous: 3.2 mmol/L (ref 0.5–1.9)

## 2021-12-07 LAB — MAGNESIUM: Magnesium: 2.5 mg/dL — ABNORMAL HIGH (ref 1.7–2.4)

## 2021-12-07 LAB — PHOSPHORUS: Phosphorus: 3.8 mg/dL (ref 2.5–4.6)

## 2021-12-07 MED ORDER — ENSURE ENLIVE PO LIQD
237.0000 mL | Freq: Two times a day (BID) | ORAL | Status: DC
  Administered 2021-12-08 – 2021-12-16 (×12): 237 mL via ORAL
  Filled 2021-12-07: qty 237

## 2021-12-07 MED ORDER — THIAMINE MONONITRATE 100 MG PO TABS
100.0000 mg | ORAL_TABLET | Freq: Every day | ORAL | Status: DC
  Administered 2021-12-07 – 2021-12-20 (×14): 100 mg via ORAL
  Filled 2021-12-07 (×14): qty 1

## 2021-12-07 MED ORDER — DOCUSATE SODIUM 50 MG/5ML PO LIQD: 100.0000 mg | Freq: Two times a day (BID) | ORAL | Status: DC

## 2021-12-07 MED ORDER — ACETAMINOPHEN 325 MG PO TABS
650.0000 mg | ORAL_TABLET | Freq: Once | ORAL | Status: AC
  Administered 2021-12-07: 650 mg via ORAL
  Filled 2021-12-07: qty 2

## 2021-12-07 MED ORDER — ADULT MULTIVITAMIN W/MINERALS CH
1.0000 | ORAL_TABLET | Freq: Every day | ORAL | Status: DC
  Administered 2021-12-08 – 2021-12-17 (×10): 1 via ORAL
  Filled 2021-12-07 (×10): qty 1

## 2021-12-07 MED ORDER — POTASSIUM CHLORIDE CRYS ER 20 MEQ PO TBCR
40.0000 meq | EXTENDED_RELEASE_TABLET | Freq: Two times a day (BID) | ORAL | Status: DC
  Administered 2021-12-07 (×2): 40 meq via ORAL
  Filled 2021-12-07 (×2): qty 2

## 2021-12-07 MED ORDER — ASPIRIN 81 MG PO CHEW
81.0000 mg | CHEWABLE_TABLET | Freq: Every day | ORAL | Status: DC
  Administered 2021-12-08 – 2021-12-20 (×13): 81 mg via ORAL
  Filled 2021-12-07 (×13): qty 1

## 2021-12-07 MED ORDER — PANTOPRAZOLE 2 MG/ML SUSPENSION
40.0000 mg | Freq: Every day | ORAL | Status: DC
  Filled 2021-12-07: qty 20

## 2021-12-07 MED ORDER — LEVOTHYROXINE SODIUM 100 MCG PO TABS
100.0000 ug | ORAL_TABLET | Freq: Every day | ORAL | Status: DC
  Administered 2021-12-08 – 2021-12-20 (×13): 100 ug via ORAL
  Filled 2021-12-07 (×13): qty 1

## 2021-12-07 MED ORDER — ACETAMINOPHEN 500 MG PO TABS
1000.0000 mg | ORAL_TABLET | Freq: Four times a day (QID) | ORAL | Status: DC | PRN
  Administered 2021-12-07 – 2021-12-08 (×2): 1000 mg via ORAL
  Filled 2021-12-07 (×2): qty 2

## 2021-12-07 MED ORDER — POTASSIUM CHLORIDE 10 MEQ/100ML IV SOLN: 10.0000 meq | INTRAVENOUS | Status: DC

## 2021-12-07 MED ORDER — POLYETHYLENE GLYCOL 3350 17 G PO PACK: 17.0000 g | PACK | Freq: Every day | ORAL | Status: DC

## 2021-12-07 MED ORDER — ACETAMINOPHEN 500 MG PO TABS
1000.0000 mg | ORAL_TABLET | Freq: Four times a day (QID) | ORAL | Status: DC | PRN
  Filled 2021-12-07: qty 2

## 2021-12-07 MED ORDER — POTASSIUM CHLORIDE CRYS ER 20 MEQ PO TBCR
40.0000 meq | EXTENDED_RELEASE_TABLET | Freq: Once | ORAL | Status: AC
  Administered 2021-12-07: 40 meq via ORAL
  Filled 2021-12-07: qty 2

## 2021-12-07 NOTE — Progress Notes (Signed)
   Inpatient Rehab Admissions Coordinator :  Per therapy recommendations patient was screened for CIR candidacy by Shivonne Schwartzman RN MSN. Patient is not yet at a level to tolerate the intensity required to pursue a CIR admit . Patient may have the potential to progress to become a candidate. The CIR admissions team will follow and monitor for progress and place a Rehab Consult order if felt to be appropriate. Please contact me with any questions.  Kerri Kovacik RN MSN Admissions Coordinator [phone]  

## 2021-12-07 NOTE — Progress Notes (Signed)
ANTICOAGULATION CONSULT NOTE - Follow Up Consult  Pharmacy Consult for heparin Indication:  h/o VTE  Labs: Recent Labs    12/05/21 2105 12/06/21 0445 12/06/21 0714 12/06/21 1640 12/07/21 0348  HGB 8.8* 8.9*  --   --  9.4*  HCT 25.7* 26.3*  --   --  27.3*  PLT 144* 180  --   --  129*  APTT 98* >200* >200* 195* 91*  HEPARINUNFRC >1.10* >1.10* >1.10* >1.10* 0.61  CREATININE 0.92 0.93  --   --   --   TROPONINIHS 1,771* 1,789*  --   --   --     Assessment/Plan:  51yo female therapeutic on heparin after rate change. Will continue infusion at current rate of 650 units/hr and confirm stable with additional level.   Wynona Neat, PharmD, BCPS  12/07/2021,4:44 AM
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
## 2021-12-07 NOTE — Progress Notes (Signed)
[facility] ADULT ICU REPLACEMENT PROTOCOL   The patient does apply for the [facility] Adult ICU Electrolyte Replacment Protocol based on the criteria listed below:   1.Exclusion criteria: TCTS patients, ECMO patients, and Dialysis patients 2. Is GFR >/= 30 ml/min? Yes.    Patient's GFR today is >60 3. Is SCr </= 2? Yes.   Patient's SCr is 0.78 mg/dL 4. Did SCr increase >/= 0.5 in 24 hours? No. 5.Pt's weight >40kg  Yes.   6. Abnormal electrolyte(s): K 3.4  7. Electrolytes replaced per protocol 8.  Call MD STAT for K+ </= 2.5, Phos </= 1, or Mag </= 1 Physician:    Ronda Fairly A 12/07/2021 4:59 AM
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
## 2021-12-07 NOTE — Plan of Care (Signed)

## 2021-12-07 NOTE — Progress Notes (Addendum)
Rounding Note    Patient Name: Mallory Ayers Date of Encounter: 12/07/2021  [place] Cardiologist: None   Subjective   AAO, denies any complaints this morning, chest wall is sore  Inpatient Medications    Scheduled Meds:  acetaminophen  650 mg Oral Q4H   Or   acetaminophen (TYLENOL) oral liquid 160 mg/5 mL  650 mg Per Tube Q4H   Or   acetaminophen  650 mg Rectal Q4H   aspirin  81 mg Per Tube Daily   Chlorhexidine Gluconate Cloth  6 each Topical Q0600   docusate  100 mg Per Tube BID   feeding supplement (PROSource TF20)  60 mL Per Tube BID   insulin aspart  0-9 Units Subcutaneous Q4H   lidocaine  1 patch Transdermal Q24H   multivitamin with minerals  1 tablet Per Tube Daily   mupirocin ointment  1 Application Nasal BID   pantoprazole  40 mg Per Tube Daily   polyethylene glycol  17 g Per Tube Daily   potassium chloride  40 mEq Oral BID   pregabalin  225 mg Oral BID   sodium chloride flush  10-40 mL Intracatheter Q12H   Continuous Infusions:  sodium chloride Stopped (12/07/21 0619)   feeding supplement (VITAL 1.5 CAL) Stopped (12/07/21 0547)   heparin 650 Units/hr (12/07/21 0700)   lidocaine 1 mg/min (12/07/21 0700)   norepinephrine (LEVOPHED) Adult infusion Stopped (12/07/21 0541)   piperacillin-tazobactam (ZOSYN)  IV 12.5 mL/hr at 12/07/21 0700   PRN Meds: [DISCONTINUED] acetaminophen **OR** acetaminophen (TYLENOL) oral liquid 160 mg/5 mL **OR** acetaminophen, acetaminophen, HYDROmorphone, ondansetron (ZOFRAN) IV, mouth rinse, sodium chloride flush   Vital Signs    Vitals:   12/07/21 0630 12/07/21 0645 12/07/21 0700 12/07/21 0715  BP: '93/67 92/65 91/63 '$ 98/64  Pulse: 77 76 75 76  Resp: 12 12 (!) 22 19  Temp:      TempSrc:      SpO2: 96% 96% 96% 97%  Weight:      Height:        Intake/Output Summary (Last 24 hours) at 12/07/2021 0738 Last data filed at 12/07/2021 0700 Gross per 24 hour  Intake 3683.53 ml  Output 250 ml  Net 3433.53 ml       12/07/2021    5:00 AM 12/05/2021    7:00 PM 11/27/2021    1:40 PM  Last 3 Weights  Weight (lbs) 181 lb 3.5 oz 180 lb 1.9 oz 165 lb 9.6 oz  Weight (kg) 82.2 kg 81.7 kg 75.116 kg      Telemetry    No new EKGs - Personally Reviewed  ECG    SR - Personally Reviewed  Physical Exam    Examined by Dr. Quentin Ore GEN: No acute distress.   Neck: No JVD Cardiac: RRR, no murmurs, rubs, or gallops.  Respiratory: CTA b/l. GI: Soft, nontender, non-distended  MS: b/l AKA's (remotely) Neuro:  Nonfocal  Psych: Normal affect   Labs    High Sensitivity Troponin:   Recent Labs  Lab 12/05/21 2105 12/06/21 0445  TROPONINIHS 1,771* 1,789*     Chemistry Recent Labs  Lab 12/05/21 2105 12/06/21 0445 12/07/21 0348  NA 132* 137 134*  K 2.9* 3.8 3.4*  CL 99 105 102  CO2 20* 23 23  GLUCOSE 120* 150* 106*  BUN <5* <5* <5*  CREATININE 0.92 0.93 0.78  CALCIUM 7.7* 7.5* 7.1*  MG  --  2.3 2.5*  PROT 4.8* 4.9* 5.3*  ALBUMIN 1.7* 1.7* 1.8*  AST 148* 140* 157*  ALT 47* 47* 48*  ALKPHOS 248* 253* 259*  BILITOT 1.1 1.2 1.6*  GFRNONAA >60 >60 >60  ANIONGAP '13 9 9    '$ Lipids  Recent Labs  Lab 12/06/21 0445  TRIG 93    Hematology Recent Labs  Lab 12/05/21 2105 12/06/21 0445 12/07/21 0348  WBC 12.4* 16.0* 10.6*  RBC 2.62* 2.69* 2.83*  HGB 8.8* 8.9* 9.4*  HCT 25.7* 26.3* 27.3*  MCV 98.1 97.8 96.5  MCH 33.6 33.1 33.2  MCHC 34.2 33.8 34.4  RDW 20.0* 20.6* 20.7*  PLT 144* 180 129*   Thyroid  Recent Labs  Lab 12/05/21 2105 12/06/21 0714  TSH 139.676*  --   FREET4  --  0.40*    BNPNo results for input(s): "BNP", "PROBNP" in the last 168 hours.  DDimer No results for input(s): "DDIMER" in the last 168 hours.   Radiology     Cardiac Studies   12/06/2021: TTE  1. Compared with the echo 07/2021, LVEF has increased slightly. There is  very mild hypokinesis of the basa anteroseptum and the basal to mid  inferoseptum which is unchanged from prior. No acute findings.   2. Left  ventricular ejection fraction, by estimation, is 55 to 60%. The  left ventricle has normal function. The left ventricle demonstrates  regional wall motion abnormalities (see scoring diagram/findings for  description). Left ventricular diastolic  parameters are consistent with Grade I diastolic dysfunction (impaired  relaxation).   3. Right ventricular systolic function is normal. The right ventricular  size is normal.   4. The mitral valve is normal in structure. Trivial mitral valve  regurgitation. No evidence of mitral stenosis.   5. The aortic valve is normal in structure. Aortic valve regurgitation is  not visualized. No aortic stenosis is present.   Comparison(s): No significant change from prior study.   07/10/21: TTE  1. GLS -9.4. Left ventricular ejection fraction, by estimation, is 55 to  60%. The left ventricle has normal function. The left ventricle has no  regional wall motion abnormalities. Left ventricular diastolic parameters  were normal.   2. Right ventricular systolic function is normal. The right ventricular  size is normal. There is normal pulmonary artery systolic pressure.   3. The mitral valve is normal in structure. No evidence of mitral valve  regurgitation. No evidence of mitral stenosis.   4. The aortic valve is normal in structure. Aortic valve regurgitation is  not visualized. No aortic stenosis is present.   5. The inferior vena cava is normal in size with greater than 50%  respiratory variability, suggesting right atrial pressure of 3 mmHg.   Patient Profile     51 y.o. female with a hx of traumatic b/l AKA, OSA, hx of provoked PE on Eliquis, chronic pain, breast cancer admitted with OOH arrest  Assessment & Plan    Long QT VF QT likely provoked by drugs, trazodone, zofran, compazine, among others and hypokalemia And perhaps noncompliance (over-taking) with meds per family QT remains long on tele, EKG is pending Keep lidocaine gtt today MS is  intact [place] level today  She is off pressor and extubated but BP probably still not able to tolerate propanolol Follow EKGs  Keep electrolytes replaced Avoid ALL QT prolonging drugs Plan off lidocaine tomorrow if rhythm OK< and propanolol if BP allows  If QT does not return to normal off meds and with normal lytes, may need to consider ICD  Prior to leaving will need to  plan some kind of coronary eval, though CT here with mild coronary Ca++, [place] likely 2/2 arrest.  CCM thinks TSH is sick euthyroid   For questions or updates, please contact [facility] Please consult [email or web address] for contact info under        Signed, Baldwin [place], PA-C  12/07/2021, 7:38 AM

## 2021-12-08 DIAGNOSIS — J9601 Acute respiratory failure with hypoxia: Secondary | ICD-10-CM | POA: Diagnosis not present

## 2021-12-08 DIAGNOSIS — I469 Cardiac arrest, cause unspecified: Secondary | ICD-10-CM | POA: Diagnosis not present

## 2021-12-08 LAB — HEPATIC FUNCTION PANEL
ALT: 51 U/L — ABNORMAL HIGH (ref 0–44)
AST: 196 U/L — ABNORMAL HIGH (ref 15–41)
Albumin: 1.8 g/dL — ABNORMAL LOW (ref 3.5–5.0)
Alkaline Phosphatase: 266 U/L — ABNORMAL HIGH (ref 38–126)
Bilirubin, Direct: 1.2 mg/dL — ABNORMAL HIGH (ref 0.0–0.2)
Indirect Bilirubin: 0.9 mg/dL (ref 0.3–0.9)
Total Bilirubin: 2.1 mg/dL — ABNORMAL HIGH (ref 0.3–1.2)
Total Protein: 5 g/dL — ABNORMAL LOW (ref 6.5–8.1)

## 2021-12-08 LAB — CBC
HCT: 26 % — ABNORMAL LOW (ref 36.0–46.0)
Hemoglobin: 8.7 g/dL — ABNORMAL LOW (ref 12.0–15.0)
MCH: 33 pg (ref 26.0–34.0)
MCHC: 33.5 g/dL (ref 30.0–36.0)
MCV: 98.5 fL (ref 80.0–100.0)
Platelets: 98 10*3/uL — ABNORMAL LOW (ref 150–400)
RBC: 2.64 MIL/uL — ABNORMAL LOW (ref 3.87–5.11)
RDW: 20.9 % — ABNORMAL HIGH (ref 11.5–15.5)
WBC: 8.9 10*3/uL (ref 4.0–10.5)
nRBC: 0 % (ref 0.0–0.2)

## 2021-12-08 LAB — BASIC METABOLIC PANEL
Anion gap: 5 (ref 5–15)
BUN: <5 mg/dL — ABNORMAL LOW (ref 6–20)
CO2: 25 mmol/L (ref 22–32)
Calcium: 7.4 mg/dL — ABNORMAL LOW (ref 8.9–10.3)
Chloride: 104 mmol/L (ref 98–111)
Creatinine, Ser: 0.62 mg/dL (ref 0.44–1.00)
GFR, Estimated: >60 mL/min (ref >60)
Glucose, Bld: 75 mg/dL (ref 70–99)
Potassium: 4.7 mmol/L (ref 3.5–5.1)
Sodium: 134 mmol/L — ABNORMAL LOW (ref 135–145)

## 2021-12-08 LAB — HEPARIN LEVEL (UNFRACTIONATED)
Heparin Unfractionated: 0.18 IU/mL — ABNORMAL LOW (ref 0.30–0.70)
Heparin Unfractionated: 0.28 IU/mL — ABNORMAL LOW (ref 0.30–0.70)
Heparin Unfractionated: 0.32 IU/mL (ref 0.30–0.70)

## 2021-12-08 LAB — PHOSPHORUS: Phosphorus: 2.8 mg/dL (ref 2.5–4.6)

## 2021-12-08 LAB — GLUCOSE, CAPILLARY
Glucose-Capillary: 117 mg/dL — ABNORMAL HIGH (ref 70–99)
Glucose-Capillary: 68 mg/dL — ABNORMAL LOW (ref 70–99)
Glucose-Capillary: 75 mg/dL (ref 70–99)
Glucose-Capillary: 83 mg/dL (ref 70–99)
Glucose-Capillary: 86 mg/dL (ref 70–99)
Glucose-Capillary: 90 mg/dL (ref 70–99)
Glucose-Capillary: 91 mg/dL (ref 70–99)

## 2021-12-08 LAB — MAGNESIUM: Magnesium: 2.1 mg/dL (ref 1.7–2.4)

## 2021-12-08 MED ORDER — CHLORHEXIDINE GLUCONATE CLOTH 2 % EX PADS
6.0000 | MEDICATED_PAD | CUTANEOUS | Status: DC
  Administered 2021-12-09 – 2021-12-20 (×12): 6 via TOPICAL

## 2021-12-08 MED ORDER — DEXTROSE 50 % IV SOLN
INTRAVENOUS | Status: AC
  Administered 2021-12-08: 12.5 g via INTRAVENOUS
  Filled 2021-12-08: qty 50

## 2021-12-08 MED ORDER — METOPROLOL TARTRATE 12.5 MG HALF TABLET
12.5000 mg | ORAL_TABLET | Freq: Two times a day (BID) | ORAL | Status: DC
  Filled 2021-12-08 (×3): qty 1

## 2021-12-08 MED ORDER — DEXTROSE 50 % IV SOLN: 12.5000 g | INTRAVENOUS | Status: AC

## 2021-12-08 MED ORDER — METHOCARBAMOL 500 MG PO TABS
500.0000 mg | ORAL_TABLET | Freq: Three times a day (TID) | ORAL | Status: DC | PRN
Start: 2021-12-08 — End: 2021-12-21
  Administered 2021-12-08 – 2021-12-20 (×16): 500 mg via ORAL
  Filled 2021-12-08 (×17): qty 1

## 2021-12-08 NOTE — Progress Notes (Signed)
eLink Physician-Brief Progress Note Patient Name: Mallory Ayers DOB: 1971/01/06 MRN: [id number]   Date of Service  12/08/2021  HPI/Events of Note  Patient with loose stools Recently completed antibiotic tx for C.diff colitis Request for antidiarrheal  eICU Interventions  Unfortunately antidiarrheal agents contraindicated in setting of active infection Supportive management     Intervention Category Minor Interventions: Other:  Theone Bowell Rodman Pickle 12/08/2021, 11:17 PM

## 2021-12-08 NOTE — Plan of Care (Signed)

## 2021-12-08 NOTE — Progress Notes (Addendum)
ANTICOAGULATION CONSULT NOTE - Follow Up Consult  Pharmacy Consult for heparin  Indication: hx of pulmonary embolus  No Known Allergies  Patient Measurements: Height: '3\' 8"'$  (111.8 cm) Weight: 82.4 kg (181 lb 10.5 oz) IBW/kg (Calculated) : 8.7 Heparin Dosing Weight: formula incorrect due to height of 3 feet 8 inches   Vital Signs: Temp: 98.6 F (37 C) (09/02 1150) Temp Source: Oral (09/02 1150) BP: 104/74 (09/02 1200) Pulse Rate: 78 (09/02 1200)  Labs: Recent Labs    12/05/21 2105 12/06/21 0445 12/06/21 0714 12/06/21 1640 12/07/21 0348 12/07/21 1210 12/08/21 0335 12/08/21 1058  HGB 8.8* 8.9*  --   --  9.4*  --  8.7*  --   HCT 25.7* 26.3*  --   --  27.3*  --  26.0*  --   PLT 144* 180  --   --  129*  --  98*  --   APTT 98* >200*   < > 195* 91* 122*  --   --   HEPARINUNFRC >1.10* >1.10*   < > >1.10* 0.61 0.46 0.18* 0.28*  CREATININE 0.92 0.93  --   --  0.78  --  0.62  --   TROPONINIHS 1,771* 1,789*  --   --   --   --   --   --    < > = values in this interval not displayed.    Estimated Creatinine Clearance: 50.7 mL/min (by C-G formula based on SCr of 0.62 mg/dL).   Medications:  Infusions:   heparin 900 Units/hr (12/08/21 1200)   lidocaine 0.5 mg/min (12/08/21 1200)    Assessment: 51 y.o female admitted for cardiac arrest secondary to prolonged Qtc. Patient has a history of 3 previous pulmonary embolism with the most recent in April 2023. 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 At home patient is anticoagulated with Eliquis.   CBC and renal function are stable, no bleeding noted. Heparin level subtherapeutic (0.28) at 900u/hr.   Goal of Therapy:  Heparin level 0.3-0.7 units/ml Monitor platelets by anticoagulation protocol: Yes   Plan:  Increase heparin to 1000u/hr (~13u/kg, previously on ~11u/kg).  Check heparin level in 6 hours.  Continue to monitor H & H and platelets.  Per CCM plan is to transition back to Eliquis  once ALTs trend down.   Ventura Sellers 12/08/2021,12:32 PM

## 2021-12-08 NOTE — Progress Notes (Signed)
ANTICOAGULATION CONSULT NOTE - Follow Up Consult  Pharmacy Consult for heparin  Indication: hx of pulmonary embolus  No Known Allergies  Patient Measurements: Height: '3\' 8"'$  (111.8 cm) Weight: 82.4 kg (181 lb 10.5 oz) IBW/kg (Calculated) : 8.7 Heparin Dosing Weight: formula incorrect due to height of 3 feet 8 inches   Vital Signs: Temp: 98.3 F (36.8 C) (09/02 1932) Temp Source: Oral (09/02 1932) BP: 102/70 (09/02 1800) Pulse Rate: 76 (09/02 1800)  Labs: Recent Labs    12/05/21 2105 12/06/21 0445 12/06/21 0714 12/06/21 1640 12/07/21 0348 12/07/21 1210 12/08/21 0335 12/08/21 1058 12/08/21 1934  HGB 8.8* 8.9*  --   --  9.4*  --  8.7*  --   --   HCT 25.7* 26.3*  --   --  27.3*  --  26.0*  --   --   PLT 144* 180  --   --  129*  --  98*  --   --   APTT 98* >200*   < > 195* 91* 122*  --   --   --   HEPARINUNFRC >1.10* >1.10*   < > >1.10* 0.61 0.46 0.18* 0.28* 0.32  CREATININE 0.92 0.93  --   --  0.78  --  0.62  --   --   TROPONINIHS 1,771* 1,789*  --   --   --   --   --   --   --    < > = values in this interval not displayed.     Estimated Creatinine Clearance: 50.7 mL/min (by C-G formula based on SCr of 0.62 mg/dL).   Medications:  Infusions:   heparin 1,000 Units/hr (12/08/21 1800)    Assessment: 51 y.o female admitted for cardiac arrest secondary to prolonged Qtc. Patient has a history of 3 previous pulmonary embolism with the most recent in April 2023. 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 At home patient is anticoagulated with Eliquis.   CBC and renal function are stable, no bleeding noted. Heparin level subtherapeutic (0.28) at 900u/hr.   HL came back therapeutic at 0.32. We will continue with the current rate and check in AM.  Goal of Therapy:  Heparin level 0.3-0.7 units/ml Monitor platelets by anticoagulation protocol: Yes   Plan:  Cont heparin 1000u/hr  Check heparin level in AM Continue to monitor H & H  and platelets.  Per CCM plan is to transition back to Eliquis once ALTs trend down.   Onnie Boer, PharmD, BCIDP, AAHIVP, CPP Infectious Disease Pharmacist 12/08/2021 8:29 PM

## 2021-12-08 NOTE — Progress Notes (Signed)
ANTICOAGULATION CONSULT NOTE - Follow Up Consult  Pharmacy Consult for heparin Indication:  h/o VTE  Labs: Recent Labs    12/05/21 2105 12/06/21 0445 12/06/21 0714 12/06/21 1640 12/07/21 0348 12/07/21 1210 12/08/21 0335  HGB 8.8* 8.9*  --   --  9.4*  --  8.7*  HCT 25.7* 26.3*  --   --  27.3*  --  26.0*  PLT 144* 180  --   --  129*  --  98*  APTT 98* >200*   < > 195* 91* 122*  --   HEPARINUNFRC >1.10* >1.10*   < > >1.10* 0.61 0.46 0.18*  CREATININE 0.92 0.93  --   --  0.78  --   --   TROPONINIHS 1,771* 1,789*  --   --   --   --   --    < > = values in this interval not displayed.     Assessment: 51yo female subtherapeutic on heparin, likely d/t Eliquis clearing; no infusion issues or signs of bleeding per RN.  Goal of Therapy:  Heparin level 0.3-0.7 units/ml   Plan:  Will increase heparin infusion by 3 units/kg/hr to 900 units/hr and check level in 6 hours.    Wynona Neat, PharmD, BCPS  12/08/2021,4:34 AM

## 2021-12-08 NOTE — Progress Notes (Signed)
Hypoglycemic Event  CBG: 68  Treatment: D50 25 mL (12.5 gm)  Symptoms: None  Follow-up CBG: Time: 0401 CBG Result: 117  Possible Reasons for Event: Inadequate meal intake     Mallory Ayers, Garnette Scheuermann
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
## 2021-12-09 DIAGNOSIS — I469 Cardiac arrest, cause unspecified: Secondary | ICD-10-CM | POA: Diagnosis not present

## 2021-12-09 DIAGNOSIS — J9601 Acute respiratory failure with hypoxia: Secondary | ICD-10-CM | POA: Diagnosis not present

## 2021-12-09 LAB — HEPARIN LEVEL (UNFRACTIONATED)
Heparin Unfractionated: 0.26 IU/mL — ABNORMAL LOW (ref 0.30–0.70)
Heparin Unfractionated: 0.34 IU/mL (ref 0.30–0.70)

## 2021-12-09 LAB — HEPATIC FUNCTION PANEL
ALT: 46 U/L — ABNORMAL HIGH (ref 0–44)
AST: 170 U/L — ABNORMAL HIGH (ref 15–41)
Albumin: 1.6 g/dL — ABNORMAL LOW (ref 3.5–5.0)
Alkaline Phosphatase: 296 U/L — ABNORMAL HIGH (ref 38–126)
Bilirubin, Direct: 0.9 mg/dL — ABNORMAL HIGH (ref 0.0–0.2)
Indirect Bilirubin: 0.9 mg/dL (ref 0.3–0.9)
Total Bilirubin: 1.8 mg/dL — ABNORMAL HIGH (ref 0.3–1.2)
Total Protein: 4.8 g/dL — ABNORMAL LOW (ref 6.5–8.1)

## 2021-12-09 LAB — GLUCOSE, CAPILLARY
Glucose-Capillary: 75 mg/dL (ref 70–99)
Glucose-Capillary: 88 mg/dL (ref 70–99)
Glucose-Capillary: 103 mg/dL — ABNORMAL HIGH (ref 70–99)
Glucose-Capillary: 97 mg/dL (ref 70–99)
Glucose-Capillary: 91 mg/dL (ref 70–99)
Glucose-Capillary: 83 mg/dL (ref 70–99)

## 2021-12-09 LAB — CBC
HCT: 24.4 % — ABNORMAL LOW (ref 36.0–46.0)
Hemoglobin: 8.3 g/dL — ABNORMAL LOW (ref 12.0–15.0)
MCH: 33.5 pg (ref 26.0–34.0)
MCHC: 34 g/dL (ref 30.0–36.0)
MCV: 98.4 fL (ref 80.0–100.0)
Platelets: 92 10*3/uL — ABNORMAL LOW (ref 150–400)
RBC: 2.48 MIL/uL — ABNORMAL LOW (ref 3.87–5.11)
RDW: 21 % — ABNORMAL HIGH (ref 11.5–15.5)
WBC: 9.4 10*3/uL (ref 4.0–10.5)
nRBC: 0 % (ref 0.0–0.2)

## 2021-12-09 LAB — BASIC METABOLIC PANEL
Anion gap: 7 (ref 5–15)
BUN: 5 mg/dL — ABNORMAL LOW (ref 6–20)
CO2: 25 mmol/L (ref 22–32)
Calcium: 7.5 mg/dL — ABNORMAL LOW (ref 8.9–10.3)
Chloride: 102 mmol/L (ref 98–111)
Creatinine, Ser: 0.56 mg/dL (ref 0.44–1.00)
GFR, Estimated: >60 mL/min (ref >60)
Glucose, Bld: 101 mg/dL — ABNORMAL HIGH (ref 70–99)
Potassium: 3.8 mmol/L (ref 3.5–5.1)
Sodium: 134 mmol/L — ABNORMAL LOW (ref 135–145)

## 2021-12-09 LAB — PHOSPHORUS: Phosphorus: 2.9 mg/dL (ref 2.5–4.6)

## 2021-12-09 LAB — MAGNESIUM: Magnesium: 1.8 mg/dL (ref 1.7–2.4)

## 2021-12-09 MED ORDER — POTASSIUM CHLORIDE CRYS ER 20 MEQ PO TBCR: 40.0000 meq | EXTENDED_RELEASE_TABLET | Freq: Once | ORAL | Status: DC

## 2021-12-09 MED ORDER — ACETAMINOPHEN 500 MG PO TABS
1000.0000 mg | ORAL_TABLET | Freq: Four times a day (QID) | ORAL | Status: DC
  Administered 2021-12-09 – 2021-12-20 (×38): 1000 mg via ORAL
  Filled 2021-12-09 (×41): qty 2

## 2021-12-09 MED ORDER — POTASSIUM CHLORIDE 10 MEQ/100ML IV SOLN
10.0000 meq | INTRAVENOUS | Status: AC
  Administered 2021-12-09 (×4): 10 meq via INTRAVENOUS
  Filled 2021-12-09 (×4): qty 100

## 2021-12-09 MED ORDER — MAGNESIUM SULFATE 2 GM/50ML IV SOLN
2.0000 g | Freq: Once | INTRAVENOUS | Status: AC
  Administered 2021-12-09: 2 g via INTRAVENOUS
  Filled 2021-12-09: qty 50

## 2021-12-09 MED ORDER — APIXABAN 5 MG PO TABS
5.0000 mg | ORAL_TABLET | Freq: Two times a day (BID) | ORAL | Status: DC
  Administered 2021-12-09 – 2021-12-14 (×11): 5 mg via ORAL
  Filled 2021-12-09 (×11): qty 1

## 2021-12-09 MED ORDER — KETOROLAC TROMETHAMINE 15 MG/ML IJ SOLN
15.0000 mg | Freq: Four times a day (QID) | INTRAMUSCULAR | Status: AC | PRN
  Administered 2021-12-10 – 2021-12-11 (×3): 15 mg via INTRAVENOUS
  Filled 2021-12-09 (×4): qty 1

## 2021-12-09 MED ORDER — MAGNESIUM CHLORIDE 64 MG PO TBEC
1.0000 | DELAYED_RELEASE_TABLET | Freq: Every day | ORAL | Status: DC
  Administered 2021-12-09 – 2021-12-20 (×11): 64 mg via ORAL
  Filled 2021-12-09 (×12): qty 1
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
## 2021-12-09 NOTE — Progress Notes (Deleted)
Pharmacy Electrolyte Replacement  Recent Labs:  Recent Labs    12/09/21 0352 12/09/21 1124  K  --  3.8  MG 1.8  --   PHOS 2.9  --   CREATININE  --  0.56    Low Critical Values (K </= 2.5, Phos </= 1, Mg </= 1) Present: None  Plan: Give 85mq oral Kcl for target K > 4 in the setting of prolonged Qtc

## 2021-12-09 NOTE — Progress Notes (Signed)
ANTICOAGULATION CONSULT NOTE - Follow Up Consult  Pharmacy Consult for heparin Indication:  h/o VTE  Labs: Recent Labs    12/06/21 0445 12/06/21 0714 12/06/21 1640 12/07/21 0348 12/07/21 1210 12/08/21 0335 12/08/21 1058 12/08/21 1934 12/09/21 0352  HGB 8.9*  --   --  9.4*  --  8.7*  --   --  8.3*  HCT 26.3*  --   --  27.3*  --  26.0*  --   --  24.4*  PLT 180  --   --  129*  --  98*  --   --  92*  APTT >200*   < > 195* 91* 122*  --   --   --   --   HEPARINUNFRC >1.10*   < > >1.10* 0.61 0.46 0.18* 0.28* 0.32 0.26*  CREATININE 0.93  --   --  0.78  --  0.62  --   --   --   TROPONINIHS 1,789*  --   --   --   --   --   --   --   --    < > = values in this interval not displayed.     Assessment: 51yo female subtherapeutic on heparin, likely d/t Eliquis clearing; no infusion issues or signs of bleeding per RN.  Goal of Therapy:  Heparin level 0.3-0.7 units/ml   Plan:  Will increase heparin infusion by 2 units/kg/hr to 1150 units/hr and check level in 6-8 hours.    Wynona Neat, PharmD, BCPS  12/09/2021,4:32 AM

## 2021-12-09 NOTE — Plan of Care (Signed)

## 2021-12-09 NOTE — H&P (Signed)
Progress Note  Patient Name: Mallory Ayers Date of Encounter: 12/09/2021  Primary Cardiologist:   None   Subjective   She denies chest pain or SOB.   Inpatient Medications    Scheduled Meds:  aspirin  81 mg Oral Daily   Chlorhexidine Gluconate Cloth  6 each Topical Daily   feeding supplement  237 mL Oral BID BM   insulin aspart  0-9 Units Subcutaneous Q4H   levothyroxine  100 mcg Oral Q0600   metoprolol tartrate  12.5 mg Oral BID   multivitamin with minerals  1 tablet Oral Daily   mupirocin ointment  1 Application Nasal BID   pregabalin  225 mg Oral BID   sodium chloride flush  10-40 mL Intracatheter Q12H   thiamine  100 mg Oral Daily   Continuous Infusions:  heparin 1,150 Units/hr (12/09/21 1100)   PRN Meds: acetaminophen, HYDROmorphone, methocarbamol, mouth rinse, sodium chloride flush   Vital Signs    Vitals:   12/09/21 0900 12/09/21 1000 12/09/21 1001 12/09/21 1100  BP: 105/72 100/74 100/74 113/72  Pulse: 78 77 79 80  Resp: '16 16 19 20  '$ Temp:      TempSrc:      SpO2: 99% 97% 97% 98%  Weight:      Height:        Intake/Output Summary (Last 24 hours) at 12/09/2021 1138 Last data filed at 12/09/2021 1100 Gross per 24 hour  Intake 406.39 ml  Output --  Net 406.39 ml   Filed Weights   12/07/21 0500 12/08/21 0500 12/09/21 0326  Weight: 82.2 kg 82.4 kg 82.3 kg    Telemetry    NSR,  one episode of pair ventricular ectopy - Personally Reviewed  ECG    NA - Personally Reviewed  Physical Exam   GEN: No acute distress.   Neck: No  JVD Cardiac: RRR, no murmurs, rubs, or gallops.  Respiratory: Clear  to auscultation bilaterally. GI: Soft, nontender, non-distended  MS: No  edema; No deformity. Neuro:  Nonfocal  Psych: Normal affect   Labs    Chemistry Recent Labs  Lab 12/06/21 0445 12/07/21 0348 12/08/21 0335 12/09/21 0352  NA 137 134* 134*  --   K 3.8 3.4* 4.7  --   CL 105 102 104  --   CO2 '23 23 25  '$ --   GLUCOSE 150* 106* 75  --   BUN  <5* <5* <5*  --   CREATININE 0.93 0.78 0.62  --   CALCIUM 7.5* 7.1* 7.4*  --   PROT 4.9* 5.3* 5.0* 4.8*  ALBUMIN 1.7* 1.8* 1.8* 1.6*  AST 140* 157* 196* 170*  ALT 47* 48* 51* 46*  ALKPHOS 253* 259* 266* 296*  BILITOT 1.2 1.6* 2.1* 1.8*  GFRNONAA >60 >60 >60  --   ANIONGAP '9 9 5  '$ --      Hematology Recent Labs  Lab 12/07/21 0348 12/08/21 0335 12/09/21 0352  WBC 10.6* 8.9 9.4  RBC 2.83* 2.64* 2.48*  HGB 9.4* 8.7* 8.3*  HCT 27.3* 26.0* 24.4*  MCV 96.5 98.5 98.4  MCH 33.2 33.0 33.5  MCHC 34.4 33.5 34.0  RDW 20.7* 20.9* 21.0*  PLT 129* 98* 92*    Cardiac EnzymesNo results for input(s): "TROPONINI" in the last 168 hours. No results for input(s): "TROPIPOC" in the last 168 hours.   BNPNo results for input(s): "BNP", "PROBNP" in the last 168 hours.   DDimer No results for input(s): "DDIMER" in the last 168 hours.  Radiology    No results found.  Cardiac Studies   ECHO:  12/06/2021: TTE  1. Compared with the echo 07/2021, LVEF has increased slightly. There is  very mild hypokinesis of the basa anteroseptum and the basal to mid  inferoseptum which is unchanged from prior. No acute findings.   2. Left ventricular ejection fraction, by estimation, is 55 to 60%. The  left ventricle has normal function. The left ventricle demonstrates  regional wall motion abnormalities (see scoring diagram/findings for  description). Left ventricular diastolic  parameters are consistent with Grade I diastolic dysfunction (impaired  relaxation).   3. Right ventricular systolic function is normal. The right ventricular  size is normal.   4. The mitral valve is normal in structure. Trivial mitral valve  regurgitation. No evidence of mitral stenosis.   5. The aortic valve is normal in structure. Aortic valve regurgitation is  not visualized. No aortic stenosis is present.    Patient Profile     51 y.o. female with a hx of traumatic b/l AKA, OSA, hx of provoked PE on Eliquis, chronic pain,  breast cancer admitted with OOH arrest  Assessment & Plan    Cardiac arrest:  Thought to be related to long QT possibly med related.  Was treated with lidocaine since discontinued.  QT as above.   Discontinue order for metoprolol.  She has not been getting this because of hypotension.  If BP allows start low dose propranolol per EP suggestions.    Supplement magnesium.   BMET is pending.   Will need CT coronary prior to discharge.    Repeat EKG for QT today.  Was still prolonged yesterday.  Might need ICD.  Avoiding QT prolonging drugs.     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     For questions or updates, please contact [place] Please consult [email or web address] for contact info under Cardiology/STEMI.   Signed, Minus Breeding, MD  12/09/2021, 11:38 AM

## 2021-12-10 ENCOUNTER — Other Ambulatory Visit: Payer: Self-pay

## 2021-12-10 DIAGNOSIS — I469 Cardiac arrest, cause unspecified: Secondary | ICD-10-CM | POA: Diagnosis not present

## 2021-12-10 DIAGNOSIS — I4729 Other ventricular tachycardia: Secondary | ICD-10-CM | POA: Diagnosis not present

## 2021-12-10 DIAGNOSIS — J9601 Acute respiratory failure with hypoxia: Secondary | ICD-10-CM | POA: Diagnosis not present

## 2021-12-10 LAB — HEPATIC FUNCTION PANEL
ALT: 43 U/L (ref 0–44)
AST: 162 U/L — ABNORMAL HIGH (ref 15–41)
Albumin: 1.6 g/dL — ABNORMAL LOW (ref 3.5–5.0)
Alkaline Phosphatase: 333 U/L — ABNORMAL HIGH (ref 38–126)
Bilirubin, Direct: 0.7 mg/dL — ABNORMAL HIGH (ref 0.0–0.2)
Indirect Bilirubin: 0.8 mg/dL (ref 0.3–0.9)
Total Bilirubin: 1.5 mg/dL — ABNORMAL HIGH (ref 0.3–1.2)
Total Protein: 5 g/dL — ABNORMAL LOW (ref 6.5–8.1)

## 2021-12-10 LAB — COMPREHENSIVE METABOLIC PANEL
ALT: 44 U/L (ref 0–44)
AST: 177 U/L — ABNORMAL HIGH (ref 15–41)
Albumin: 1.7 g/dL — ABNORMAL LOW (ref 3.5–5.0)
Alkaline Phosphatase: 383 U/L — ABNORMAL HIGH (ref 38–126)
Anion gap: 6 (ref 5–15)
BUN: 9 mg/dL (ref 6–20)
CO2: 23 mmol/L (ref 22–32)
Calcium: 7.3 mg/dL — ABNORMAL LOW (ref 8.9–10.3)
Chloride: 103 mmol/L (ref 98–111)
Creatinine, Ser: 0.88 mg/dL (ref 0.44–1.00)
GFR, Estimated: >60 mL/min (ref >60)
Glucose, Bld: 88 mg/dL (ref 70–99)
Potassium: 4.1 mmol/L (ref 3.5–5.1)
Sodium: 132 mmol/L — ABNORMAL LOW (ref 135–145)
Total Bilirubin: 1.5 mg/dL — ABNORMAL HIGH (ref 0.3–1.2)
Total Protein: 5.4 g/dL — ABNORMAL LOW (ref 6.5–8.1)

## 2021-12-10 LAB — GLUCOSE, CAPILLARY
Glucose-Capillary: 78 mg/dL (ref 70–99)
Glucose-Capillary: 74 mg/dL (ref 70–99)
Glucose-Capillary: 93 mg/dL (ref 70–99)
Glucose-Capillary: 85 mg/dL (ref 70–99)
Glucose-Capillary: 86 mg/dL (ref 70–99)
Glucose-Capillary: 80 mg/dL (ref 70–99)

## 2021-12-10 LAB — PHOSPHORUS
Phosphorus: 3.2 mg/dL (ref 2.5–4.6)
Phosphorus: 3.2 mg/dL (ref 2.5–4.6)

## 2021-12-10 LAB — CBC
HCT: 25.5 % — ABNORMAL LOW (ref 36.0–46.0)
Hemoglobin: 8.4 g/dL — ABNORMAL LOW (ref 12.0–15.0)
MCH: 32.8 pg (ref 26.0–34.0)
MCHC: 32.9 g/dL (ref 30.0–36.0)
MCV: 99.6 fL (ref 80.0–100.0)
Platelets: 83 10*3/uL — ABNORMAL LOW (ref 150–400)
RBC: 2.56 MIL/uL — ABNORMAL LOW (ref 3.87–5.11)
RDW: 21 % — ABNORMAL HIGH (ref 11.5–15.5)
WBC: 7.7 10*3/uL (ref 4.0–10.5)
nRBC: 0 % (ref 0.0–0.2)

## 2021-12-10 LAB — MAGNESIUM
Magnesium: 2.3 mg/dL (ref 1.7–2.4)
Magnesium: 2.4 mg/dL (ref 1.7–2.4)

## 2021-12-10 LAB — LIDOCAINE LEVEL: Lidocaine Lvl: 4.3 ug/mL (ref 1.5–5.0)

## 2021-12-10 MED ORDER — GUAIFENESIN ER 600 MG PO TB12
600.0000 mg | ORAL_TABLET | Freq: Two times a day (BID) | ORAL | Status: DC | PRN
Start: 2021-12-10 — End: 2021-12-21
  Administered 2021-12-10 – 2021-12-17 (×5): 600 mg via ORAL
  Filled 2021-12-10 (×6): qty 1

## 2021-12-10 NOTE — Progress Notes (Signed)
I responded to a page from the nurse to provide HPOA information for the patient and her family. I arrived at the patient's room where her daughter and fianc were present. I shared the HPOA and answered their questions. They will complete the information at a later time. I provided spiritual support by sharing words of encouragement and by leading in prayer.    12/10/21 1206  Clinical Encounter Type  Visited With Patient and family together  Visit Type Initial;Spiritual support  Referral From Nurse  Consult/Referral To Chaplain  Spiritual Encounters  Spiritual Needs Prayer;Literature     Chaplain Dr Redgie Grayer

## 2021-12-10 NOTE — Progress Notes (Signed)
Electrophysiology Rounding Note  Patient Name: Mallory Ayers Date of Encounter:  12/11/21   Primary Cardiologist: None Electrophysiologist: New   Subjective   NAEO. Remains confused  Inpatient Medications    Scheduled Meds:  acetaminophen  1,000 mg Oral Q6H   apixaban  5 mg Oral BID   aspirin  81 mg Oral Daily   Chlorhexidine Gluconate Cloth  6 each Topical Daily   feeding supplement  237 mL Oral BID BM   insulin aspart  0-9 Units Subcutaneous Q4H   levothyroxine  100 mcg Oral Q0600   magnesium chloride  1 tablet Oral Daily   multivitamin with minerals  1 tablet Oral Daily   pregabalin  225 mg Oral BID   sodium chloride flush  10-40 mL Intracatheter Q12H   thiamine  100 mg Oral Daily   Continuous Infusions:  PRN Meds: guaiFENesin, HYDROmorphone, ketorolac, methocarbamol, mouth rinse, sodium chloride flush   Vital Signs    Vitals:   12/11/21 0500 12/11/21 0700 12/11/21 0800 12/11/21 0815  BP: 105/76 103/79 116/79   Pulse: 75  67   Resp: 14 14 (!) 23   Temp:    (!) 97.4 F (36.3 C)  TempSrc:    Oral  SpO2: 100%  99%   Weight: 85.4 kg     Height:        Intake/Output Summary (Last 24 hours) at 12/11/2021 0906 Last data filed at 12/10/2021 0914 Gross per 24 hour  Intake 10 ml  Output --  Net 10 ml   Filed Weights   12/09/21 0326 12/10/21 0323 12/11/21 0500  Weight: 82.3 kg 83.2 kg 85.4 kg    Physical Exam    GEN- The patient is encephalopathic.  Head- normocephalic, atraumatic Eyes-  Sclera clear, conjunctiva pink Ears- hearing intact Oropharynx- clear Neck- supple Lungs- Clear to ausculation bilaterally, normal work of breathing Heart- Regular rate and rhythm, no murmurs, rubs or gallops GI- soft, NT, ND, + BS Extremities- no clubbing or cyanosis. No edema Skin- no rash or lesion Psych- euthymic mood, full affect Neuro- strength and sensation are intact  Labs    CBC Recent Labs    12/10/21 0342 12/11/21 0338  WBC 7.7 6.8  NEUTROABS  --   4.4  HGB 8.4* 8.6*  HCT 25.5* 26.0*  MCV 99.6 99.2  PLT 83* 78*   Basic Metabolic Panel Recent Labs    12/10/21 0916 12/11/21 0338  NA 132* 134*  K 4.1 3.9  CL 103 105  CO2 23 22  GLUCOSE 88 80  BUN 9 14  CREATININE 0.88 0.77  CALCIUM 7.3* 7.5*  MG 2.4 2.3  PHOS 3.2 3.4   Liver Function Tests Recent Labs    12/10/21 0916 12/11/21 0338  AST 177* 164*  ALT 44 41  ALKPHOS 383* 384*  BILITOT 1.5* 1.4*  PROT 5.4* 4.9*  ALBUMIN 1.7* 1.6*   No results for input(s): "LIPASE", "AMYLASE" in the last 72 hours. Cardiac Enzymes No results for input(s): "CKTOTAL", "CKMB", "CKMBINDEX", "TROPONINI" in the last 72 hours.   Telemetry    NSR 60-70s (personally reviewed)  Radiology    No results found.  Patient Profile     51yo woman with bilateral traumatic AKA, OSA, hx of provoked PE on eliquis, chronic pain, breast ca s/p R mastectomy with L sided port who presented with OHCA in setting of significantly prolonged QT.  Assessment & Plan    OHCA 2. VT/VF 3. Prolonged QT QT significantly prolonged on arrival.  Remote ECG without QT prolongation. QT persistently prolonged despite washed out.  ? from her cancer therapies.  She will need ICD.  Plan for S-ICD given given no pacing indication, right sided mastectomy and left sided port.   Timing pending course.  Ultimately, may require Lifevest as bridge to mental status recovery and ICD in the near future.   4. Thrombocytopenia PLT  78* (09/05 0338) HGB  8.6* (09/05 0338) Follow. Ideally would be higher at time of implant. Will likely need transfusion prior to implant  For questions or updates, please contact [place] Please consult [email or web address] for contact info under Cardiology/STEMI.  Signed, Shirley Friar, PA-C  12/11/2021, 9:06 AM
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
## 2021-12-10 NOTE — Progress Notes (Signed)
EP Rounding Note    Patient Name: Mallory Ayers Date of Encounter: 12/10/2021  [place] Cardiologist: None   Subjective   NAEO. Family at bedside. Worsening confusion according to daughter.   Inpatient Medications    Scheduled Meds:  acetaminophen  1,000 mg Oral Q6H   apixaban  5 mg Oral BID   aspirin  81 mg Oral Daily   Chlorhexidine Gluconate Cloth  6 each Topical Daily   feeding supplement  237 mL Oral BID BM   insulin aspart  0-9 Units Subcutaneous Q4H   levothyroxine  100 mcg Oral Q0600   magnesium chloride  1 tablet Oral Daily   multivitamin with minerals  1 tablet Oral Daily   mupirocin ointment  1 Application Nasal BID   pregabalin  225 mg Oral BID   sodium chloride flush  10-40 mL Intracatheter Q12H   thiamine  100 mg Oral Daily   Continuous Infusions:  PRN Meds: HYDROmorphone, ketorolac, methocarbamol, mouth rinse, sodium chloride flush   Vital Signs    Vitals:   12/10/21 0400 12/10/21 0500 12/10/21 0600 12/10/21 0753  BP: '92/60 92/64 95/68 '$   Pulse: 60 (!) 59 (!) 59   Resp: '15 12 13   '$ Temp:    (!) 97.4 F (36.3 C)  TempSrc:    Oral  SpO2: 100% 100% 100%   Weight:      Height:        Intake/Output Summary (Last 24 hours) at 12/10/2021 0906 Last data filed at 12/09/2021 2340 Gross per 24 hour  Intake 733.13 ml  Output 125 ml  Net 608.13 ml      12/10/2021    3:23 AM 12/09/2021    3:26 AM 12/08/2021    5:00 AM  Last 3 Weights  Weight (lbs) 183 lb 6.8 oz 181 lb 7 oz 181 lb 10.5 oz  Weight (kg) 83.2 kg 82.3 kg 82.4 kg      Telemetry    Sinus. QT prolonged. - Personally Reviewed  ECG    QT remains >556m - Personally Reviewed  Physical Exam   GEN: No acute distress.   Neck: No JVD Cardiac: RRR, no murmurs, rubs, or gallops.  Respiratory: Clear to auscultation bilaterally. GI: Soft, nontender, non-distended  MS: No edema; No deformity. Neuro:  Nonfocal  Psych: Normal affect   Labs    High Sensitivity Troponin:   Recent  Labs  Lab 12/05/21 2105 12/06/21 0445  TROPONINIHS 1,771* 1,789*     Chemistry Recent Labs  Lab 12/07/21 0348 12/08/21 0335 12/09/21 0352 12/09/21 1124 12/10/21 0342  NA 134* 134*  --  134*  --   K 3.4* 4.7  --  3.8  --   CL 102 104  --  102  --   CO2 23 25  --  25  --   GLUCOSE 106* 75  --  101*  --   BUN <5* <5*  --  5*  --   CREATININE 0.78 0.62  --  0.56  --   CALCIUM 7.1* 7.4*  --  7.5*  --   MG 2.5* 2.1 1.8  --  2.3  PROT 5.3* 5.0* 4.8*  --  5.0*  ALBUMIN 1.8* 1.8* 1.6*  --  1.6*  AST 157* 196* 170*  --  162*  ALT 48* 51* 46*  --  43  ALKPHOS 259* 266* 296*  --  333*  BILITOT 1.6* 2.1* 1.8*  --  1.5*  GFRNONAA >60 >60  --  >  60  --   ANIONGAP 9 5  --  7  --     Lipids  Recent Labs  Lab 12/06/21 0445  TRIG 93    Hematology Recent Labs  Lab 12/08/21 0335 12/09/21 0352 12/10/21 0342  WBC 8.9 9.4 7.7  RBC 2.64* 2.48* 2.56*  HGB 8.7* 8.3* 8.4*  HCT 26.0* 24.4* 25.5*  MCV 98.5 98.4 99.6  MCH 33.0 33.5 32.8  MCHC 33.5 34.0 32.9  RDW 20.9* 21.0* 21.0*  PLT 98* 92* 83*   Thyroid  Recent Labs  Lab 12/05/21 2105 12/06/21 0714  TSH 139.676*  --   FREET4  --  0.40*    BNPNo results for input(s): "BNP", "PROBNP" in the last 168 hours.  DDimer No results for input(s): "DDIMER" in the last 168 hours.   Radiology    No results found.   Assessment & Plan    51yo woman with bilateral traumatic AKA, OSA, hx of provoked PE on eliquis, chronic pain, breast ca s/p R mastectomy with L sided port who presented with OHCA in setting of significantly prolonged QT.  #OHCA #VT/VF #Prolonged QT QT significantly prolonged on arrival. Remote ECG without QT prolongation. QT remains prolonged after home meds known to prolong QT have washed out. I suspect there may be a contribution from her cancer therapies.  She will require ICD. Plan for subcutaneous device given no pacing indication, right sided mastectomy and left sided port. Timing TBD. If mental status clears and  she is otherwise stable this admission, can proceed prior to discharge. Will need platelets to be higher prior to implant. If mental status continues to wax/wane, consider discharge with lifevest with return after 6-8 weeks as outpatient.  Patient and family updated at the bedside this AM.   For questions or updates, please contact [place] Please consult [email or web address] for contact info under        Signed, Vickie Epley, MD  12/10/2021, 9:06 AM

## 2021-12-11 DIAGNOSIS — R197 Diarrhea, unspecified: Secondary | ICD-10-CM

## 2021-12-11 DIAGNOSIS — I4729 Other ventricular tachycardia: Secondary | ICD-10-CM | POA: Diagnosis not present

## 2021-12-11 DIAGNOSIS — J9601 Acute respiratory failure with hypoxia: Secondary | ICD-10-CM | POA: Diagnosis not present

## 2021-12-11 DIAGNOSIS — I469 Cardiac arrest, cause unspecified: Secondary | ICD-10-CM | POA: Diagnosis not present

## 2021-12-11 DIAGNOSIS — E871 Hypo-osmolality and hyponatremia: Secondary | ICD-10-CM

## 2021-12-11 LAB — CBC WITH DIFFERENTIAL/PLATELET
Abs Immature Granulocytes: 0.04 10*3/uL (ref 0.00–0.07)
Basophils Absolute: 0 10*3/uL (ref 0.0–0.1)
Basophils Relative: 0 %
Eosinophils Absolute: 0.3 10*3/uL (ref 0.0–0.5)
Eosinophils Relative: 4 %
HCT: 26 % — ABNORMAL LOW (ref 36.0–46.0)
Hemoglobin: 8.6 g/dL — ABNORMAL LOW (ref 12.0–15.0)
Immature Granulocytes: 1 %
Lymphocytes Relative: 22 %
Lymphs Abs: 1.5 10*3/uL (ref 0.7–4.0)
MCH: 32.8 pg (ref 26.0–34.0)
MCHC: 33.1 g/dL (ref 30.0–36.0)
MCV: 99.2 fL (ref 80.0–100.0)
Monocytes Absolute: 0.6 10*3/uL (ref 0.1–1.0)
Monocytes Relative: 9 %
Neutro Abs: 4.4 10*3/uL (ref 1.7–7.7)
Neutrophils Relative %: 64 %
Platelets: 78 10*3/uL — ABNORMAL LOW (ref 150–400)
RBC: 2.62 MIL/uL — ABNORMAL LOW (ref 3.87–5.11)
RDW: 21 % — ABNORMAL HIGH (ref 11.5–15.5)
WBC: 6.8 10*3/uL (ref 4.0–10.5)
nRBC: 0 % (ref 0.0–0.2)

## 2021-12-11 LAB — COMPREHENSIVE METABOLIC PANEL
ALT: 41 U/L (ref 0–44)
AST: 164 U/L — ABNORMAL HIGH (ref 15–41)
Albumin: 1.6 g/dL — ABNORMAL LOW (ref 3.5–5.0)
Alkaline Phosphatase: 384 U/L — ABNORMAL HIGH (ref 38–126)
Anion gap: 7 (ref 5–15)
BUN: 14 mg/dL (ref 6–20)
CO2: 22 mmol/L (ref 22–32)
Calcium: 7.5 mg/dL — ABNORMAL LOW (ref 8.9–10.3)
Chloride: 105 mmol/L (ref 98–111)
Creatinine, Ser: 0.77 mg/dL (ref 0.44–1.00)
GFR, Estimated: >60 mL/min (ref >60)
Glucose, Bld: 80 mg/dL (ref 70–99)
Potassium: 3.9 mmol/L (ref 3.5–5.1)
Sodium: 134 mmol/L — ABNORMAL LOW (ref 135–145)
Total Bilirubin: 1.4 mg/dL — ABNORMAL HIGH (ref 0.3–1.2)
Total Protein: 4.9 g/dL — ABNORMAL LOW (ref 6.5–8.1)

## 2021-12-11 LAB — GLUCOSE, CAPILLARY
Glucose-Capillary: 62 mg/dL — ABNORMAL LOW (ref 70–99)
Glucose-Capillary: 71 mg/dL (ref 70–99)
Glucose-Capillary: 73 mg/dL (ref 70–99)
Glucose-Capillary: 79 mg/dL (ref 70–99)
Glucose-Capillary: 83 mg/dL (ref 70–99)
Glucose-Capillary: 90 mg/dL (ref 70–99)
Glucose-Capillary: 92 mg/dL (ref 70–99)

## 2021-12-11 LAB — PHOSPHORUS: Phosphorus: 3.4 mg/dL (ref 2.5–4.6)

## 2021-12-11 LAB — MAGNESIUM: Magnesium: 2.3 mg/dL (ref 1.7–2.4)

## 2021-12-11 MED ORDER — CALCIUM POLYCARBOPHIL 625 MG PO TABS
625.0000 mg | ORAL_TABLET | Freq: Every day | ORAL | Status: DC
  Administered 2021-12-11 – 2021-12-17 (×7): 625 mg via ORAL
  Filled 2021-12-11 (×9): qty 1

## 2021-12-11 NOTE — Progress Notes (Signed)
   12/11/21 1545  Clinical Encounter Type  Visited With Patient and family together  Visit Type Follow-up;Spiritual support  Referral From Nurse  Consult/Referral To Chaplain  Spiritual Encounters  Spiritual Needs Emotional  Advance Directives (For Healthcare)  Does Patient Have a Medical Advance Directive? Yes   In follow-up to previous visit by Cristal Generous, Chaplain responded to page regarding completion of the [place]. Coordinated execution of document with Kieth Brightly as requested. Scanned copy into patient records and distributed 5 copies to patient.   Melody Haver, Resident Chaplain [phone]
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
## 2021-12-11 NOTE — Progress Notes (Signed)
Hypoglycemic Event  CBG: 62  Treatment: Pt ate her breakfast  Symptoms: None  Follow-up CBG: [id number] CBG Result:83  Possible Reasons for Event: Inadequate meal intake  Comments/MD notified:Hypoglycemic protocol initiated     Mico Spark F

## 2021-12-11 NOTE — Progress Notes (Signed)
Inpatient Rehab Admissions Coordinator:   Will place an order for full assessment per our protocol.   Shann Medal, PT, DPT Admissions Coordinator [phone] 12/11/21  12:23 PM
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
## 2021-12-12 DIAGNOSIS — J9601 Acute respiratory failure with hypoxia: Secondary | ICD-10-CM | POA: Diagnosis not present

## 2021-12-12 DIAGNOSIS — I4729 Other ventricular tachycardia: Secondary | ICD-10-CM | POA: Diagnosis not present

## 2021-12-12 DIAGNOSIS — R197 Diarrhea, unspecified: Secondary | ICD-10-CM | POA: Diagnosis not present

## 2021-12-12 DIAGNOSIS — I469 Cardiac arrest, cause unspecified: Secondary | ICD-10-CM | POA: Diagnosis not present

## 2021-12-12 LAB — CBC WITH DIFFERENTIAL/PLATELET
Abs Immature Granulocytes: 0.04 10*3/uL (ref 0.00–0.07)
Basophils Absolute: 0 10*3/uL (ref 0.0–0.1)
Basophils Relative: 1 %
Eosinophils Absolute: 0.4 10*3/uL (ref 0.0–0.5)
Eosinophils Relative: 5 %
HCT: 29.4 % — ABNORMAL LOW (ref 36.0–46.0)
Hemoglobin: 9.6 g/dL — ABNORMAL LOW (ref 12.0–15.0)
Immature Granulocytes: 1 %
Lymphocytes Relative: 20 %
Lymphs Abs: 1.6 10*3/uL (ref 0.7–4.0)
MCH: 33.3 pg (ref 26.0–34.0)
MCHC: 32.7 g/dL (ref 30.0–36.0)
MCV: 102.1 fL — ABNORMAL HIGH (ref 80.0–100.0)
Monocytes Absolute: 0.8 10*3/uL (ref 0.1–1.0)
Monocytes Relative: 9 %
Neutro Abs: 5.3 10*3/uL (ref 1.7–7.7)
Neutrophils Relative %: 64 %
Platelets: 93 10*3/uL — ABNORMAL LOW (ref 150–400)
RBC: 2.88 MIL/uL — ABNORMAL LOW (ref 3.87–5.11)
RDW: 21.7 % — ABNORMAL HIGH (ref 11.5–15.5)
WBC: 8.2 10*3/uL (ref 4.0–10.5)
nRBC: 0 % (ref 0.0–0.2)

## 2021-12-12 LAB — COMPREHENSIVE METABOLIC PANEL
ALT: 52 U/L — ABNORMAL HIGH (ref 0–44)
AST: 207 U/L — ABNORMAL HIGH (ref 15–41)
Albumin: 1.8 g/dL — ABNORMAL LOW (ref 3.5–5.0)
Alkaline Phosphatase: 444 U/L — ABNORMAL HIGH (ref 38–126)
Anion gap: 9 (ref 5–15)
BUN: 11 mg/dL (ref 6–20)
CO2: 21 mmol/L — ABNORMAL LOW (ref 22–32)
Calcium: 7.8 mg/dL — ABNORMAL LOW (ref 8.9–10.3)
Chloride: 105 mmol/L (ref 98–111)
Creatinine, Ser: 0.78 mg/dL (ref 0.44–1.00)
GFR, Estimated: >60 mL/min (ref >60)
Glucose, Bld: 106 mg/dL — ABNORMAL HIGH (ref 70–99)
Potassium: 3.6 mmol/L (ref 3.5–5.1)
Sodium: 135 mmol/L (ref 135–145)
Total Bilirubin: 1.1 mg/dL (ref 0.3–1.2)
Total Protein: 5.7 g/dL — ABNORMAL LOW (ref 6.5–8.1)

## 2021-12-12 LAB — GLUCOSE, CAPILLARY
Glucose-Capillary: 83 mg/dL (ref 70–99)
Glucose-Capillary: 69 mg/dL — ABNORMAL LOW (ref 70–99)
Glucose-Capillary: 88 mg/dL (ref 70–99)
Glucose-Capillary: 70 mg/dL (ref 70–99)
Glucose-Capillary: 87 mg/dL (ref 70–99)

## 2021-12-12 LAB — PHOSPHORUS: Phosphorus: 4 mg/dL (ref 2.5–4.6)

## 2021-12-12 LAB — MAGNESIUM: Magnesium: 2.4 mg/dL (ref 1.7–2.4)
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
## 2021-12-12 NOTE — Progress Notes (Signed)
  Telemetry remains quiescent.   Neuro status continues to improve. She is currently awake and alert, and able to recount some of the events of her admission (which have been recounted to her).  Discussed briefly the indication for defibrillator.  She is agreeable.   Dr. Quentin Ore to see tomorrow to more fully discuss planning for ICD. Timing is also dependent on discharge planning.   Legrand Como [address] [place], PA-C  12/12/2021 1:33 PM

## 2021-12-12 NOTE — Progress Notes (Signed)
Inpatient Rehab Admissions Coordinator:    I met with pt. Regarding potential CIR admit. She is interested, fiance can provide 24/7 support. ELOS of 7-10 days, expectation for participation, and insurance approval process was discussed. I will open a case with pt.'s insurance and pursue for admit.  Clemens Catholic, [place], [place] Admissions Coordinator  [phone] ([place]) [phone] (office)
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
## 2021-12-13 ENCOUNTER — Other Ambulatory Visit: Payer: Self-pay

## 2021-12-13 DIAGNOSIS — J9601 Acute respiratory failure with hypoxia: Secondary | ICD-10-CM | POA: Diagnosis not present

## 2021-12-13 DIAGNOSIS — I469 Cardiac arrest, cause unspecified: Secondary | ICD-10-CM | POA: Diagnosis not present

## 2021-12-13 DIAGNOSIS — R7989 Other specified abnormal findings of blood chemistry: Secondary | ICD-10-CM

## 2021-12-13 DIAGNOSIS — I4729 Other ventricular tachycardia: Secondary | ICD-10-CM | POA: Diagnosis not present

## 2021-12-13 LAB — COMPREHENSIVE METABOLIC PANEL
ALT: 49 U/L — ABNORMAL HIGH (ref 0–44)
AST: 172 U/L — ABNORMAL HIGH (ref 15–41)
Albumin: 1.6 g/dL — ABNORMAL LOW (ref 3.5–5.0)
Alkaline Phosphatase: 441 U/L — ABNORMAL HIGH (ref 38–126)
Anion gap: 6 (ref 5–15)
BUN: 8 mg/dL (ref 6–20)
CO2: 23 mmol/L (ref 22–32)
Calcium: 7.6 mg/dL — ABNORMAL LOW (ref 8.9–10.3)
Chloride: 106 mmol/L (ref 98–111)
Creatinine, Ser: 0.59 mg/dL (ref 0.44–1.00)
GFR, Estimated: >60 mL/min (ref >60)
Glucose, Bld: 75 mg/dL (ref 70–99)
Potassium: 3.6 mmol/L (ref 3.5–5.1)
Sodium: 135 mmol/L (ref 135–145)
Total Bilirubin: 1.2 mg/dL (ref 0.3–1.2)
Total Protein: 5.2 g/dL — ABNORMAL LOW (ref 6.5–8.1)

## 2021-12-13 LAB — CBC WITH DIFFERENTIAL/PLATELET
Abs Immature Granulocytes: 0.03 10*3/uL (ref 0.00–0.07)
Basophils Absolute: 0 10*3/uL (ref 0.0–0.1)
Basophils Relative: 1 %
Eosinophils Absolute: 0.3 10*3/uL (ref 0.0–0.5)
Eosinophils Relative: 4 %
HCT: 26.9 % — ABNORMAL LOW (ref 36.0–46.0)
Hemoglobin: 8.8 g/dL — ABNORMAL LOW (ref 12.0–15.0)
Immature Granulocytes: 0 %
Lymphocytes Relative: 22 %
Lymphs Abs: 1.5 10*3/uL (ref 0.7–4.0)
MCH: 33.3 pg (ref 26.0–34.0)
MCHC: 32.7 g/dL (ref 30.0–36.0)
MCV: 101.9 fL — ABNORMAL HIGH (ref 80.0–100.0)
Monocytes Absolute: 0.6 10*3/uL (ref 0.1–1.0)
Monocytes Relative: 9 %
Neutro Abs: 4.4 10*3/uL (ref 1.7–7.7)
Neutrophils Relative %: 64 %
Platelets: 88 10*3/uL — ABNORMAL LOW (ref 150–400)
RBC: 2.64 MIL/uL — ABNORMAL LOW (ref 3.87–5.11)
RDW: 21.5 % — ABNORMAL HIGH (ref 11.5–15.5)
WBC: 6.8 10*3/uL (ref 4.0–10.5)
nRBC: 0 % (ref 0.0–0.2)

## 2021-12-13 LAB — GLUCOSE, CAPILLARY
Glucose-Capillary: 62 mg/dL — ABNORMAL LOW (ref 70–99)
Glucose-Capillary: 70 mg/dL (ref 70–99)
Glucose-Capillary: 75 mg/dL (ref 70–99)
Glucose-Capillary: 82 mg/dL (ref 70–99)
Glucose-Capillary: 90 mg/dL (ref 70–99)
Glucose-Capillary: 95 mg/dL (ref 70–99)
Glucose-Capillary: 98 mg/dL (ref 70–99)
Glucose-Capillary: 99 mg/dL (ref 70–99)

## 2021-12-13 LAB — PHOSPHORUS: Phosphorus: 3.7 mg/dL (ref 2.5–4.6)

## 2021-12-13 LAB — MAGNESIUM: Magnesium: 2.1 mg/dL (ref 1.7–2.4)

## 2021-12-13 NOTE — Care Management Important Message (Signed)
Important Message  Patient Details  Name: Mallory Ayers MRN: [id number] Date of Birth: [birth year]   Medicare Important Message Given:  Yes     [name] 12/13/2021, 9:16 AM
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
## 2021-12-13 NOTE — Progress Notes (Addendum)
IP rehab admissions - Noted PT now recommending no follow up needed.  No need for inpatient rehab admission.  Recommend discharge directly to home once medically ready.  I will sign off for CIR at this point.  Call for questions.  [phone]
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
## 2021-12-13 NOTE — Plan of Care (Signed)
  Problem: Education: Goal: Knowledge of General Education information will improve Description: Including pain rating scale, medication(s)/side effects and non-pharmacologic comfort measures Outcome: Progressing   Problem: Health Behavior/Discharge Planning: Goal: Ability to manage health-related needs will improve Outcome: Progressing   Problem: Clinical Measurements: Goal: Ability to maintain clinical measurements within normal limits will improve Outcome: Progressing Goal: Will remain free from infection Outcome: Progressing Goal: Diagnostic test results will improve Outcome: Progressing Goal: Respiratory complications will improve Outcome: Progressing   Problem: Activity: Goal: Risk for activity intolerance will decrease Outcome: Progressing   Problem: Nutrition: Goal: Adequate nutrition will be maintained Outcome: Progressing   Problem: Coping: Goal: Level of anxiety will decrease Outcome: Progressing   Problem: Elimination: Goal: Will not experience complications related to bowel motility Outcome: Progressing Goal: Will not experience complications related to urinary retention Outcome: Progressing

## 2021-12-14 ENCOUNTER — Other Ambulatory Visit: Payer: Self-pay

## 2021-12-14 DIAGNOSIS — R7989 Other specified abnormal findings of blood chemistry: Secondary | ICD-10-CM | POA: Diagnosis not present

## 2021-12-14 DIAGNOSIS — I469 Cardiac arrest, cause unspecified: Secondary | ICD-10-CM | POA: Diagnosis not present

## 2021-12-14 DIAGNOSIS — J9601 Acute respiratory failure with hypoxia: Secondary | ICD-10-CM | POA: Diagnosis not present

## 2021-12-14 DIAGNOSIS — I4729 Other ventricular tachycardia: Secondary | ICD-10-CM | POA: Diagnosis not present

## 2021-12-14 LAB — GLUCOSE, CAPILLARY
Glucose-Capillary: 81 mg/dL (ref 70–99)
Glucose-Capillary: 67 mg/dL — ABNORMAL LOW (ref 70–99)
Glucose-Capillary: 94 mg/dL (ref 70–99)
Glucose-Capillary: 81 mg/dL (ref 70–99)
Glucose-Capillary: 78 mg/dL (ref 70–99)
Glucose-Capillary: 78 mg/dL (ref 70–99)

## 2021-12-14 LAB — COMPREHENSIVE METABOLIC PANEL
ALT: 53 U/L — ABNORMAL HIGH (ref 0–44)
AST: 174 U/L — ABNORMAL HIGH (ref 15–41)
Albumin: 1.7 g/dL — ABNORMAL LOW (ref 3.5–5.0)
Alkaline Phosphatase: 450 U/L — ABNORMAL HIGH (ref 38–126)
Anion gap: 8 (ref 5–15)
BUN: 7 mg/dL (ref 6–20)
CO2: 23 mmol/L (ref 22–32)
Calcium: 7.9 mg/dL — ABNORMAL LOW (ref 8.9–10.3)
Chloride: 107 mmol/L (ref 98–111)
Creatinine, Ser: 0.59 mg/dL (ref 0.44–1.00)
GFR, Estimated: >60 mL/min (ref >60)
Glucose, Bld: 79 mg/dL (ref 70–99)
Potassium: 3.9 mmol/L (ref 3.5–5.1)
Sodium: 138 mmol/L (ref 135–145)
Total Bilirubin: 1.2 mg/dL (ref 0.3–1.2)
Total Protein: 5.3 g/dL — ABNORMAL LOW (ref 6.5–8.1)

## 2021-12-14 LAB — PHOSPHORUS: Phosphorus: 3.3 mg/dL (ref 2.5–4.6)

## 2021-12-14 LAB — CBC WITH DIFFERENTIAL/PLATELET
Abs Immature Granulocytes: 0.02 10*3/uL (ref 0.00–0.07)
Basophils Absolute: 0 10*3/uL (ref 0.0–0.1)
Basophils Relative: 1 %
Eosinophils Absolute: 0.2 10*3/uL (ref 0.0–0.5)
Eosinophils Relative: 3 %
HCT: 27.7 % — ABNORMAL LOW (ref 36.0–46.0)
Hemoglobin: 8.9 g/dL — ABNORMAL LOW (ref 12.0–15.0)
Immature Granulocytes: 0 %
Lymphocytes Relative: 20 %
Lymphs Abs: 1.3 10*3/uL (ref 0.7–4.0)
MCH: 33.2 pg (ref 26.0–34.0)
MCHC: 32.1 g/dL (ref 30.0–36.0)
MCV: 103.4 fL — ABNORMAL HIGH (ref 80.0–100.0)
Monocytes Absolute: 0.6 10*3/uL (ref 0.1–1.0)
Monocytes Relative: 10 %
Neutro Abs: 4.3 10*3/uL (ref 1.7–7.7)
Neutrophils Relative %: 66 %
Platelets: 93 10*3/uL — ABNORMAL LOW (ref 150–400)
RBC: 2.68 MIL/uL — ABNORMAL LOW (ref 3.87–5.11)
RDW: 21.9 % — ABNORMAL HIGH (ref 11.5–15.5)
WBC: 6.5 10*3/uL (ref 4.0–10.5)
nRBC: 0 % (ref 0.0–0.2)

## 2021-12-14 LAB — MAGNESIUM: Magnesium: 2 mg/dL (ref 1.7–2.4)

## 2021-12-14 MED ORDER — HEPARIN (PORCINE) 25000 UT/250ML-% IV SOLN
1000.0000 [IU]/h | INTRAVENOUS | Status: DC
  Administered 2021-12-14: 1000 [IU]/h via INTRAVENOUS
  Filled 2021-12-14: qty 250

## 2021-12-14 NOTE — TOC Progression Note (Signed)
Transition of Care [facility]) - Progression Note    Patient Details  Name: Mallory Ayers MRN: [id number] Date of Birth: 11-12-1970  [facility] CM/SW Contact  Zenon Mayo, RN Phone Number: 12/14/2021, 4:16 PM  Clinical Narrative:    txr from 64M, hx breast ca, cards to see, ?ICD placement, bil BKA,indep.TOC following.         Expected Discharge Plan and Services                                                 Social Determinants of Health (SDOH) Interventions    Readmission Risk Interventions     No data to display

## 2021-12-14 NOTE — Plan of Care (Signed)
  Problem: Nutritional: Goal: Maintenance of adequate nutrition will improve Outcome: Completed/Met   Problem: Skin Integrity: Goal: Risk for impaired skin integrity will decrease Outcome: Completed/Met

## 2021-12-14 NOTE — Progress Notes (Signed)
Electrophysiology Rounding Note  Patient Name: Mallory Ayers Date of Encounter: 12/14/2021  Primary Cardiologist: None Electrophysiologist: None   Subjective   NAEO  Inpatient Medications    Scheduled Meds:  acetaminophen  1,000 mg Oral Q6H   apixaban  5 mg Oral BID   aspirin  81 mg Oral Daily   Chlorhexidine Gluconate Cloth  6 each Topical Daily   feeding supplement  237 mL Oral BID BM   insulin aspart  0-9 Units Subcutaneous Q4H   levothyroxine  100 mcg Oral Q0600   magnesium chloride  1 tablet Oral Daily   multivitamin with minerals  1 tablet Oral Daily   polycarbophil  625 mg Oral Daily   pregabalin  225 mg Oral BID   sodium chloride flush  10-40 mL Intracatheter Q12H   thiamine  100 mg Oral Daily   Continuous Infusions:  PRN Meds: guaiFENesin, HYDROmorphone, ketorolac, methocarbamol, mouth rinse, sodium chloride flush   Vital Signs    Vitals:   12/13/21 0749 12/13/21 1949 12/14/21 0400 12/14/21 0748  BP: 103/69 113/81 107/74 95/67  Pulse: 73 75 78 75  Resp: '12 18 16 14  '$ Temp: (!) 97.5 F (36.4 C) 97.6 F (36.4 C) 97.9 F (36.6 C) 97.7 F (36.5 C)  TempSrc: Oral Oral Oral Oral  SpO2: 100% 100% 100% 100%  Weight:   81.3 kg   Height:        Intake/Output Summary (Last 24 hours) at 12/14/2021 1208 Last data filed at 12/14/2021 0934 Gross per 24 hour  Intake 358 ml  Output 300 ml  Net 58 ml   Filed Weights   12/11/21 0500 12/13/21 0139 12/14/21 0400  Weight: 85.4 kg 81.2 kg 81.3 kg    Physical Exam    GEN- The patient is well appearing, alert and oriented x 3 today.   Head- normocephalic, atraumatic Eyes-  Sclera clear, conjunctiva pink Ears- hearing intact Oropharynx- clear Neck- supple Lungs- Clear to ausculation bilaterally, normal work of breathing Heart- Regular rate and rhythm, no murmurs, rubs or gallops GI- soft, NT, ND, + BS Extremities- no clubbing or cyanosis. No edema Skin- no rash or lesion Psych- euthymic mood, full  affect Neuro- strength and sensation are intact  Labs    CBC Recent Labs    12/13/21 0445 12/14/21 0557  WBC 6.8 6.5  NEUTROABS 4.4 4.3  HGB 8.8* 8.9*  HCT 26.9* 27.7*  MCV 101.9* 103.4*  PLT 88* 93*   Basic Metabolic Panel Recent Labs    12/13/21 0445 12/14/21 0557  NA 135 138  K 3.6 3.9  CL 106 107  CO2 23 23  GLUCOSE 75 79  BUN 8 7  CREATININE 0.59 0.59  CALCIUM 7.6* 7.9*  MG 2.1 2.0  PHOS 3.7 3.3   Liver Function Tests Recent Labs    12/13/21 0445 12/14/21 0557  AST 172* 174*  ALT 49* 53*  ALKPHOS 441* 450*  BILITOT 1.2 1.2  PROT 5.2* 5.3*  ALBUMIN 1.6* 1.7*   No results for input(s): "LIPASE", "AMYLASE" in the last 72 hours. Cardiac Enzymes No results for input(s): "CKTOTAL", "CKMB", "CKMBINDEX", "TROPONINI" in the last 72 hours.   Telemetry    NSR 70s no further VT (personally reviewed)  Radiology    No results found.  Patient Profile     51yo woman with bilateral traumatic AKA, OSA, hx of provoked PE on eliquis, chronic pain, breast ca s/p R mastectomy with L sided port who presented with OHCA in setting  of significantly prolonged QT.  Assessment & Plan    OHCA 2. VT/VF 3. Prolonged QT QT significantly prolonged on arrival. Remote ECG without QT prolongation. QT persistently prolonged despite washed out.  ? from her cancer therapies.  She will need ICD.  Tentatively plan for S-ICD Tuesday, 9/12 S-ICD given given no pacing indication, right sided mastectomy and left sided port.   Given the severity of her arrest and QT prolongation, we do not recommend discharge with lifevest.    4. Thrombocytopenia PLT  93* (09/08 0557) HGB  8.9* (09/08 0557) Follow. Ideally would be higher at time of implant. Will likely need transfusion prior to implant  For questions or updates, please contact [place] Please consult [email or web address] for contact info under Cardiology/STEMI.  Signed, Shirley Friar, PA-C  12/14/2021, 12:08 PM

## 2021-12-14 NOTE — Progress Notes (Addendum)
ANTICOAGULATION CONSULT NOTE - Follow Up Consult  Pharmacy Consult for apixaban>heparin  Indication: hx of pulmonary embolus  No Known Allergies  Patient Measurements: Height: '3\' 8"'$  (111.8 cm) Weight: 81.3 kg (179 lb 3.7 oz) IBW/kg (Calculated) : 8.7 Heparin Dosing Weight: formula incorrect due to height of 3 feet 8 inches   Vital Signs: Temp: 97.7 F (36.5 C) (09/08 1240) Temp Source: Oral (09/08 0748) BP: 103/72 (09/08 1240) Pulse Rate: 67 (09/08 1240)  Labs: Recent Labs    12/12/21 0957 12/13/21 0445 12/14/21 0557  HGB 9.6* 8.8* 8.9*  HCT 29.4* 26.9* 27.7*  PLT 93* 88* 93*  CREATININE 0.78 0.59 0.59     Estimated Creatinine Clearance: 50.1 mL/min (by C-G formula based on SCr of 0.59 mg/dL).   Medications:  Infusions:     Assessment: 51 y.o female admitted for cardiac arrest secondary to prolonged Qtc. Patient has a history of 3 previous pulmonary embolism with the most recent in April 2023. 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 At home patient is anticoagulated with Eliquis.   Plan to transition back to IV heparin for pacing next week. She was therapeutic on heparin 1000units/hr recently. Plan to stop midnight Monday.   Goal of Therapy:  Heparin level 0.3-0.7 units/ml APTT: 66-102 Monitor platelets by anticoagulation protocol: Yes   Plan:  Start heparin 1000u/hr at 9PM tonight Check heparin level/PTT in AM Continue to monitor H & H and platelets.   Onnie Boer, PharmD, BCIDP, AAHIVP, CPP Infectious Disease Pharmacist 12/14/2021 4:04 PM
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
## 2021-12-15 DIAGNOSIS — R197 Diarrhea, unspecified: Secondary | ICD-10-CM | POA: Diagnosis not present

## 2021-12-15 DIAGNOSIS — D539 Nutritional anemia, unspecified: Secondary | ICD-10-CM

## 2021-12-15 DIAGNOSIS — J9601 Acute respiratory failure with hypoxia: Secondary | ICD-10-CM | POA: Diagnosis not present

## 2021-12-15 DIAGNOSIS — I4729 Other ventricular tachycardia: Secondary | ICD-10-CM | POA: Diagnosis not present

## 2021-12-15 DIAGNOSIS — I469 Cardiac arrest, cause unspecified: Secondary | ICD-10-CM | POA: Diagnosis not present

## 2021-12-15 DIAGNOSIS — D696 Thrombocytopenia, unspecified: Secondary | ICD-10-CM

## 2021-12-15 LAB — URINALYSIS, ROUTINE W REFLEX MICROSCOPIC
Bacteria, UA: NONE SEEN
Bilirubin Urine: NEGATIVE
Glucose, UA: NEGATIVE mg/dL
Hgb urine dipstick: NEGATIVE
Ketones, ur: NEGATIVE mg/dL
Nitrite: NEGATIVE
Protein, ur: NEGATIVE mg/dL
Specific Gravity, Urine: 1.014 (ref 1.005–1.030)
pH: 5 (ref 5.0–8.0)

## 2021-12-15 LAB — PHOSPHORUS: Phosphorus: 3.3 mg/dL (ref 2.5–4.6)

## 2021-12-15 LAB — IRON AND TIBC: Iron: 74 ug/dL (ref 28–170)

## 2021-12-15 LAB — CBC WITH DIFFERENTIAL/PLATELET
Abs Immature Granulocytes: 0.03 10*3/uL (ref 0.00–0.07)
Basophils Absolute: 0.1 10*3/uL (ref 0.0–0.1)
Basophils Relative: 1 %
Eosinophils Absolute: 0.2 10*3/uL (ref 0.0–0.5)
Eosinophils Relative: 2 %
HCT: 21.1 % — ABNORMAL LOW (ref 36.0–46.0)
Hemoglobin: 7.1 g/dL — ABNORMAL LOW (ref 12.0–15.0)
Immature Granulocytes: 0 %
Lymphocytes Relative: 17 %
Lymphs Abs: 1.3 10*3/uL (ref 0.7–4.0)
MCH: 34.5 pg — ABNORMAL HIGH (ref 26.0–34.0)
MCHC: 33.6 g/dL (ref 30.0–36.0)
MCV: 102.4 fL — ABNORMAL HIGH (ref 80.0–100.0)
Monocytes Absolute: 0.6 10*3/uL (ref 0.1–1.0)
Monocytes Relative: 9 %
Neutro Abs: 5.3 10*3/uL (ref 1.7–7.7)
Neutrophils Relative %: 71 %
Platelets: 97 10*3/uL — ABNORMAL LOW (ref 150–400)
RBC: 2.06 MIL/uL — ABNORMAL LOW (ref 3.87–5.11)
RDW: 21.7 % — ABNORMAL HIGH (ref 11.5–15.5)
WBC Morphology: ABNORMAL
WBC: 7.5 10*3/uL (ref 4.0–10.5)
nRBC: 0 % (ref 0.0–0.2)

## 2021-12-15 LAB — GLUCOSE, CAPILLARY
Glucose-Capillary: 60 mg/dL — ABNORMAL LOW (ref 70–99)
Glucose-Capillary: 65 mg/dL — ABNORMAL LOW (ref 70–99)
Glucose-Capillary: 71 mg/dL (ref 70–99)
Glucose-Capillary: 74 mg/dL (ref 70–99)
Glucose-Capillary: 74 mg/dL (ref 70–99)
Glucose-Capillary: 85 mg/dL (ref 70–99)
Glucose-Capillary: 86 mg/dL (ref 70–99)
Glucose-Capillary: 92 mg/dL (ref 70–99)

## 2021-12-15 LAB — COMPREHENSIVE METABOLIC PANEL
ALT: 57 U/L — ABNORMAL HIGH (ref 0–44)
AST: 189 U/L — ABNORMAL HIGH (ref 15–41)
Albumin: 1.6 g/dL — ABNORMAL LOW (ref 3.5–5.0)
Alkaline Phosphatase: 428 U/L — ABNORMAL HIGH (ref 38–126)
Anion gap: 8 (ref 5–15)
BUN: 7 mg/dL (ref 6–20)
CO2: 22 mmol/L (ref 22–32)
Calcium: 7.6 mg/dL — ABNORMAL LOW (ref 8.9–10.3)
Chloride: 106 mmol/L (ref 98–111)
Creatinine, Ser: 0.56 mg/dL (ref 0.44–1.00)
GFR, Estimated: >60 mL/min (ref >60)
Glucose, Bld: 78 mg/dL (ref 70–99)
Potassium: 3.5 mmol/L (ref 3.5–5.1)
Sodium: 136 mmol/L (ref 135–145)
Total Bilirubin: 1.5 mg/dL — ABNORMAL HIGH (ref 0.3–1.2)
Total Protein: 5.1 g/dL — ABNORMAL LOW (ref 6.5–8.1)

## 2021-12-15 LAB — RETICULOCYTES
Immature Retic Fract: 18.3 % — ABNORMAL HIGH (ref 2.3–15.9)
RBC.: 2.57 MIL/uL — ABNORMAL LOW (ref 3.87–5.11)
Retic Count, Absolute: 159.1 10*3/uL (ref 19.0–186.0)
Retic Ct Pct: 6.2 % — ABNORMAL HIGH (ref 0.4–3.1)

## 2021-12-15 LAB — FERRITIN: Ferritin: 693 ng/mL — ABNORMAL HIGH (ref 11–307)

## 2021-12-15 LAB — OCCULT BLOOD X 1 CARD TO LAB, STOOL: Fecal Occult Bld: POSITIVE — AB

## 2021-12-15 LAB — APTT
aPTT: 190 seconds (ref 24–36)
aPTT: 79 seconds — ABNORMAL HIGH (ref 24–36)

## 2021-12-15 LAB — FOLATE: Folate: 7.2 ng/mL (ref >5.9)

## 2021-12-15 LAB — VITAMIN B12: Vitamin B-12: 2544 pg/mL — ABNORMAL HIGH (ref 180–914)

## 2021-12-15 LAB — HEMOGLOBIN AND HEMATOCRIT, BLOOD
HCT: 30.8 % — ABNORMAL LOW (ref 36.0–46.0)
Hemoglobin: 10.5 g/dL — ABNORMAL LOW (ref 12.0–15.0)

## 2021-12-15 LAB — MAGNESIUM: Magnesium: 1.8 mg/dL (ref 1.7–2.4)

## 2021-12-15 LAB — HEPARIN LEVEL (UNFRACTIONATED): Heparin Unfractionated: >1.1 IU/mL — ABNORMAL HIGH (ref 0.30–0.70)

## 2021-12-15 LAB — ABO/RH: ABO/RH(D): O POS

## 2021-12-15 LAB — PREPARE RBC (CROSSMATCH)

## 2021-12-15 MED ORDER — POTASSIUM CHLORIDE CRYS ER 20 MEQ PO TBCR
40.0000 meq | EXTENDED_RELEASE_TABLET | Freq: Once | ORAL | Status: AC
Start: 2021-12-15 — End: 2021-12-15
  Administered 2021-12-15: 40 meq via ORAL
  Filled 2021-12-15: qty 2

## 2021-12-15 MED ORDER — SODIUM CHLORIDE 0.9% IV SOLUTION: Freq: Once | INTRAVENOUS | Status: AC

## 2021-12-15 MED ORDER — HEPARIN (PORCINE) 25000 UT/250ML-% IV SOLN
1250.0000 [IU]/h | INTRAVENOUS | Status: AC
  Administered 2021-12-15 – 2021-12-16 (×2): 700 [IU]/h via INTRAVENOUS
  Administered 2021-12-17: 950 [IU]/h via INTRAVENOUS
  Filled 2021-12-15 (×2): qty 250
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
## 2021-12-15 NOTE — Progress Notes (Signed)
Per Infectious disease; although the patient is not symptomatic, she must remain on enteric precautions for 30 days after receiving treatment for C-diff.

## 2021-12-15 NOTE — Progress Notes (Signed)
ANTICOAGULATION CONSULT NOTE - Follow Up Consult  Pharmacy Consult for apixaban>heparin  Indication: hx of pulmonary embolus  No Known Allergies  Patient Measurements: Height: '3\' 8"'$  (111.8 cm) Weight: 80.5 kg (177 lb 7.5 oz) IBW/kg (Calculated) : 8.7 Heparin Dosing Weight: formula incorrect due to height of 3 feet 8 inches   Vital Signs: Temp: 97.9 F (36.6 C) (09/09 1615) Temp Source: Oral (09/09 1615) BP: 111/80 (09/09 1615) Pulse Rate: 68 (09/09 1615)  Labs: Recent Labs    12/13/21 0445 12/14/21 0557 12/15/21 0440 12/15/21 1906 12/15/21 1907  HGB 8.8* 8.9* 7.1*  --  10.5*  HCT 26.9* 27.7* 21.1*  --  30.8*  PLT 88* 93* 97*  --   --   APTT  --   --  190* 79*  --   HEPARINUNFRC  --   --  >1.10*  --   --   CREATININE 0.59 0.59 0.56  --   --      Estimated Creatinine Clearance: 49.7 mL/min (by C-G formula based on SCr of 0.56 mg/dL).   Medications:  Infusions:   heparin 700 Units/hr (12/15/21 1651)     Assessment: 51 y.o female admitted for cardiac arrest secondary to prolonged Qtc. Patient has a history of 3 previous PEs with most recent in April 2023. 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 On Eliquis PTA.   Eliquis restarted 9/3, and last dose was on 9/8 '@0922'$  Transition back to IV heparin for ICD placement next week (Tuesday if able).  Plan to stop midnight Monday.  aPTT therapeutic at 79 this evening. Hgb was down this morning to 7.1, transfused 1 x PRBC this morning. Will need Hgb >9 and Plt >100 prior to surgery.  Goal of Therapy:  Heparin level 0.3-0.7 units/ml APTT: 66-102 Monitor platelets by anticoagulation protocol: Yes   Plan:  Continue heparin infusion at 700 units/hr Check anti-Xa level in 6 hours and daily while on heparin Continue to monitor H&H and platelets    Thank you for allowing [place] to participate in this patients care. Jens Som, PharmD 12/15/2021 7:44 PM  **Pharmacist phone  directory can be found on [email or web address] listed under [place]**

## 2021-12-15 NOTE — Progress Notes (Signed)
Rounding Note    Patient Name: Mallory Ayers Date of Encounter: 12/15/2021  [place] Cardiologist: None   Subjective   Up to restroom this AM. A little more sluggish today. Hgb lower on AM labwork.  Inpatient Medications    Scheduled Meds:  sodium chloride   Intravenous Once   acetaminophen  1,000 mg Oral Q6H   aspirin  81 mg Oral Daily   Chlorhexidine Gluconate Cloth  6 each Topical Daily   feeding supplement  237 mL Oral BID BM   insulin aspart  0-9 Units Subcutaneous Q4H   levothyroxine  100 mcg Oral Q0600   magnesium chloride  1 tablet Oral Daily   multivitamin with minerals  1 tablet Oral Daily   polycarbophil  625 mg Oral Daily   potassium chloride  40 mEq Oral Once   pregabalin  225 mg Oral BID   sodium chloride flush  10-40 mL Intracatheter Q12H   thiamine  100 mg Oral Daily   Continuous Infusions:  heparin 700 Units/hr (12/15/21 0729)   PRN Meds: guaiFENesin, HYDROmorphone, methocarbamol, mouth rinse, sodium chloride flush   Vital Signs    Vitals:   12/14/21 1202 12/14/21 1240 12/14/21 2030 12/15/21 0437  BP: 103/72 103/72 121/89 115/87  Pulse:  67 71 71  Resp: '10 10 17 16  '$ Temp:  97.7 F (36.5 C) 97.7 F (36.5 C) 98.2 F (36.8 C)  TempSrc:   Oral Oral  SpO2:   100% 100%  Weight:    80.5 kg  Height:        Intake/Output Summary (Last 24 hours) at 12/15/2021 0915 Last data filed at 12/15/2021 0443 Gross per 24 hour  Intake 875.09 ml  Output 900 ml  Net -24.91 ml      12/15/2021    4:37 AM 12/14/2021    4:00 AM 12/13/2021    1:39 AM  Last 3 Weights  Weight (lbs) 177 lb 7.5 oz 179 lb 3.7 oz 179 lb 0.2 oz  Weight (kg) 80.5 kg 81.3 kg 81.2 kg      Telemetry    Personally Reviewed  ECG    Personally Reviewed  Physical Exam   GEN: No acute distress.   Neck: No JVD Cardiac: RRR, no murmurs, rubs, or gallops.  Respiratory: Clear to auscultation bilaterally. GI: Soft, nontender, non-distended  MS: No edema; No deformity. Neuro:   Nonfocal  Psych: Normal affect. Sluggish.  Labs    High Sensitivity Troponin:   Recent Labs  Lab 12/05/21 2105 12/06/21 0445  TROPONINIHS 1,771* 1,789*     Chemistry Recent Labs  Lab 12/13/21 0445 12/14/21 0557 12/15/21 0440  NA 135 138 136  K 3.6 3.9 3.5  CL 106 107 106  CO2 '23 23 22  '$ GLUCOSE 75 79 78  BUN '8 7 7  '$ CREATININE 0.59 0.59 0.56  CALCIUM 7.6* 7.9* 7.6*  MG 2.1 2.0 1.8  PROT 5.2* 5.3* 5.1*  ALBUMIN 1.6* 1.7* 1.6*  AST 172* 174* 189*  ALT 49* 53* 57*  ALKPHOS 441* 450* 428*  BILITOT 1.2 1.2 1.5*  GFRNONAA >60 >60 >60  ANIONGAP '6 8 8    '$ Lipids No results for input(s): "CHOL", "TRIG", "HDL", "LABVLDL", "LDLCALC", "CHOLHDL" in the last 168 hours.  Hematology Recent Labs  Lab 12/13/21 0445 12/14/21 0557 12/15/21 0440  WBC 6.8 6.5 7.5  RBC 2.64* 2.68* 2.06*  HGB 8.8* 8.9* 7.1*  HCT 26.9* 27.7* 21.1*  MCV 101.9* 103.4* 102.4*  MCH 33.3 33.2 34.5*  MCHC 32.7 32.1 33.6  RDW 21.5* 21.9* 21.7*  PLT 88* 93* 97*   Thyroid No results for input(s): "TSH", "FREET4" in the last 168 hours.  BNPNo results for input(s): "BNP", "PROBNP" in the last 168 hours.  DDimer No results for input(s): "DDIMER" in the last 168 hours.   Radiology    No results found.    Assessment & Plan    51yo woman with bilateral traumatic AKA, OSA, hx of provoked PE on eliquis, chronic pain, breast ca s/p R mastectomy with L sided port who presented with OHCA in setting of significantly prolonged QT.  #OHCA #VT/VF #Prolonged QT Suspect QT prolongation from cancer therapies.  Tentatively planning for S-ICD implant Tuesday if clinically stable. Will need Hgb >9 and Plt >100 prior to surgery.  - will need CTA coronary prior to device implant. Plan for Monday if clinically stable.   #Anemia #Thrombocytopenia Discussed new drop in Hgb with hospitalist who is planning for PRBC transfusion and anemia work up. Will follow closely to see if this impacts S-ICD timing.    For  questions or updates, please contact [place] Please consult [email or web address] for contact info under        Signed, Vickie Epley, MD  12/15/2021, 9:15 AM

## 2021-12-15 NOTE — Progress Notes (Signed)
[place] for heparin  Indication: hx of pulmonary embolus Brief A/P: aPTT supratherapeutic Decrease Heparin rate  No Known Allergies  Patient Measurements: Height: '3\' 8"'$  (111.8 cm) Weight: 80.5 kg (177 lb 7.5 oz) IBW/kg (Calculated) : 8.7  Vital Signs: Temp: 98.2 F (36.8 C) (09/09 0437) Temp Source: Oral (09/09 0437) BP: 115/87 (09/09 0437) Pulse Rate: 71 (09/09 0437)  Labs: Recent Labs    12/13/21 0445 12/14/21 0557 12/15/21 0440  HGB 8.8* 8.9* 7.1*  HCT 26.9* 27.7* 21.1*  PLT 88* 93* 97*  APTT  --   --  190*  HEPARINUNFRC  --   --  >1.10*  CREATININE 0.59 0.59 0.56     Estimated Creatinine Clearance: 49.7 mL/min (by C-G formula based on SCr of 0.56 mg/dL).   Assessment: 51 y.o. female with h/o PE, Eliquis on hold, for heparin  Goal of Therapy:  Heparin level 0.3-0.7 units/ml APTT: 66-102 sec Monitor platelets by anticoagulation protocol: Yes   Plan:  Hold heparin x 1 hour, then decrease heparin 700 units/hr Check aPTT in 8 hours   Phillis Knack, PharmD, BCPS  12/15/2021 5:49 AM

## 2021-12-16 DIAGNOSIS — I4729 Other ventricular tachycardia: Secondary | ICD-10-CM | POA: Diagnosis not present

## 2021-12-16 DIAGNOSIS — J9601 Acute respiratory failure with hypoxia: Secondary | ICD-10-CM | POA: Diagnosis not present

## 2021-12-16 DIAGNOSIS — I469 Cardiac arrest, cause unspecified: Secondary | ICD-10-CM | POA: Diagnosis not present

## 2021-12-16 DIAGNOSIS — R7989 Other specified abnormal findings of blood chemistry: Secondary | ICD-10-CM | POA: Diagnosis not present

## 2021-12-16 LAB — COMPREHENSIVE METABOLIC PANEL
ALT: 59 U/L — ABNORMAL HIGH (ref 0–44)
AST: 180 U/L — ABNORMAL HIGH (ref 15–41)
Albumin: 1.8 g/dL — ABNORMAL LOW (ref 3.5–5.0)
Alkaline Phosphatase: 518 U/L — ABNORMAL HIGH (ref 38–126)
Anion gap: 7 (ref 5–15)
BUN: <5 mg/dL — ABNORMAL LOW (ref 6–20)
CO2: 22 mmol/L (ref 22–32)
Calcium: 7.9 mg/dL — ABNORMAL LOW (ref 8.9–10.3)
Chloride: 107 mmol/L (ref 98–111)
Creatinine, Ser: 0.59 mg/dL (ref 0.44–1.00)
GFR, Estimated: >60 mL/min (ref >60)
Glucose, Bld: 74 mg/dL (ref 70–99)
Potassium: 4 mmol/L (ref 3.5–5.1)
Sodium: 136 mmol/L (ref 135–145)
Total Bilirubin: 1.2 mg/dL (ref 0.3–1.2)
Total Protein: 5.6 g/dL — ABNORMAL LOW (ref 6.5–8.1)

## 2021-12-16 LAB — CBC WITH DIFFERENTIAL/PLATELET
Abs Immature Granulocytes: 0.06 10*3/uL (ref 0.00–0.07)
Basophils Absolute: 0.1 10*3/uL (ref 0.0–0.1)
Basophils Relative: 1 %
Eosinophils Absolute: 0.2 10*3/uL (ref 0.0–0.5)
Eosinophils Relative: 2 %
HCT: 33.1 % — ABNORMAL LOW (ref 36.0–46.0)
Hemoglobin: 10.8 g/dL — ABNORMAL LOW (ref 12.0–15.0)
Immature Granulocytes: 1 %
Lymphocytes Relative: 14 %
Lymphs Abs: 1.5 10*3/uL (ref 0.7–4.0)
MCH: 32.9 pg (ref 26.0–34.0)
MCHC: 32.6 g/dL (ref 30.0–36.0)
MCV: 100.9 fL — ABNORMAL HIGH (ref 80.0–100.0)
Monocytes Absolute: 0.7 10*3/uL (ref 0.1–1.0)
Monocytes Relative: 7 %
Neutro Abs: 8.3 10*3/uL — ABNORMAL HIGH (ref 1.7–7.7)
Neutrophils Relative %: 75 %
Platelets: 110 10*3/uL — ABNORMAL LOW (ref 150–400)
RBC: 3.28 MIL/uL — ABNORMAL LOW (ref 3.87–5.11)
RDW: 22.9 % — ABNORMAL HIGH (ref 11.5–15.5)
WBC: 10.9 10*3/uL — ABNORMAL HIGH (ref 4.0–10.5)
nRBC: 0 % (ref 0.0–0.2)

## 2021-12-16 LAB — MAGNESIUM: Magnesium: 2 mg/dL (ref 1.7–2.4)

## 2021-12-16 LAB — GLUCOSE, CAPILLARY
Glucose-Capillary: 67 mg/dL — ABNORMAL LOW (ref 70–99)
Glucose-Capillary: 87 mg/dL (ref 70–99)
Glucose-Capillary: 74 mg/dL (ref 70–99)
Glucose-Capillary: 64 mg/dL — ABNORMAL LOW (ref 70–99)
Glucose-Capillary: 77 mg/dL (ref 70–99)
Glucose-Capillary: 79 mg/dL (ref 70–99)

## 2021-12-16 LAB — APTT: aPTT: 85 seconds — ABNORMAL HIGH (ref 24–36)

## 2021-12-16 LAB — PHOSPHORUS: Phosphorus: 3.3 mg/dL (ref 2.5–4.6)

## 2021-12-16 LAB — HEPARIN LEVEL (UNFRACTIONATED): Heparin Unfractionated: 0.23 IU/mL — ABNORMAL LOW (ref 0.30–0.70)

## 2021-12-16 NOTE — Progress Notes (Signed)
Rounding Note    Patient Name: NILAYA BOUIE Date of Encounter: 12/16/2021  [place] Cardiologist: None   Subjective   Feeling better today after PRBC transfusion yesterday.  Inpatient Medications    Scheduled Meds:  acetaminophen  1,000 mg Oral Q6H   aspirin  81 mg Oral Daily   Chlorhexidine Gluconate Cloth  6 each Topical Daily   feeding supplement  237 mL Oral BID BM   insulin aspart  0-9 Units Subcutaneous Q4H   levothyroxine  100 mcg Oral Q0600   magnesium chloride  1 tablet Oral Daily   multivitamin with minerals  1 tablet Oral Daily   polycarbophil  625 mg Oral Daily   pregabalin  225 mg Oral BID   sodium chloride flush  10-40 mL Intracatheter Q12H   thiamine  100 mg Oral Daily   Continuous Infusions:  heparin 700 Units/hr (12/16/21 0320)   PRN Meds: guaiFENesin, HYDROmorphone, methocarbamol, mouth rinse, sodium chloride flush   Vital Signs    Vitals:   12/15/21 1615 12/15/21 2000 12/15/21 2300 12/16/21 0418  BP: 111/80 (!) 117/90 122/85 118/80  Pulse: 68 81  75  Resp: (!) '22 19  17  '$ Temp: 97.9 F (36.6 C) 97.7 F (36.5 C)  97.6 F (36.4 C)  TempSrc: Oral Oral  Oral  SpO2: 96% 97%  100%  Weight:    81.7 kg  Height:        Intake/Output Summary (Last 24 hours) at 12/16/2021 0905 Last data filed at 12/15/2021 1651 Gross per 24 hour  Intake 400.94 ml  Output --  Net 400.94 ml       12/16/2021    4:18 AM 12/15/2021    4:37 AM 12/14/2021    4:00 AM  Last 3 Weights  Weight (lbs) 180 lb 1.9 oz 177 lb 7.5 oz 179 lb 3.7 oz  Weight (kg) 81.7 kg 80.5 kg 81.3 kg      Telemetry    Personally Reviewed  ECG    Personally Reviewed  Physical Exam   GEN: No acute distress.   Neck: No JVD Cardiac: RRR, no murmurs, rubs, or gallops.  Respiratory: Clear to auscultation bilaterally. GI: Soft, nontender, non-distended  MS: No edema; No deformity. Neuro:  Nonfocal  Psych: Normal affect. Sluggish.  Labs    High Sensitivity Troponin:    Recent Labs  Lab 12/05/21 2105 12/06/21 0445  TROPONINIHS 1,771* 1,789*      Chemistry Recent Labs  Lab 12/13/21 0445 12/14/21 0557 12/15/21 0440  NA 135 138 136  K 3.6 3.9 3.5  CL 106 107 106  CO2 '23 23 22  '$ GLUCOSE 75 79 78  BUN '8 7 7  '$ CREATININE 0.59 0.59 0.56  CALCIUM 7.6* 7.9* 7.6*  MG 2.1 2.0 1.8  PROT 5.2* 5.3* 5.1*  ALBUMIN 1.6* 1.7* 1.6*  AST 172* 174* 189*  ALT 49* 53* 57*  ALKPHOS 441* 450* 428*  BILITOT 1.2 1.2 1.5*  GFRNONAA >60 >60 >60  ANIONGAP '6 8 8     '$ Lipids No results for input(s): "CHOL", "TRIG", "HDL", "LABVLDL", "LDLCALC", "CHOLHDL" in the last 168 hours.  Hematology Recent Labs  Lab 12/14/21 0557 12/15/21 0440 12/15/21 0935 12/15/21 1907 12/16/21 0827  WBC 6.5 7.5  --   --  10.9*  RBC 2.68* 2.06* 2.57*  --  3.28*  HGB 8.9* 7.1*  --  10.5* 10.8*  HCT 27.7* 21.1*  --  30.8* 33.1*  MCV 103.4* 102.4*  --   --  100.9*  MCH 33.2 34.5*  --   --  32.9  MCHC 32.1 33.6  --   --  32.6  RDW 21.9* 21.7*  --   --  22.9*  PLT 93* 97*  --   --  110*    Thyroid No results for input(s): "TSH", "FREET4" in the last 168 hours.  BNPNo results for input(s): "BNP", "PROBNP" in the last 168 hours.  DDimer No results for input(s): "DDIMER" in the last 168 hours.   Radiology    No results found.    Assessment & Plan    51yo woman with bilateral traumatic AKA, OSA, hx of provoked PE on eliquis, chronic pain, breast ca s/p R mastectomy with L sided port who presented with OHCA in setting of significantly prolonged QT.  #OHCA #VT/VF #Prolonged QT Suspect QT prolongation from cancer therapies.  Tentatively planning for S-ICD implant Tuesday if clinically stable. Will need Hgb >9 and Plt >100 prior to surgery.  - will need CTA coronary prior to device implant. Plan for Monday if clinically stable.   #Anemia #Thrombocytopenia S/p PRBC  transfusion, feeling better.     For questions or updates, please contact [place] Please  consult [email or web address] for contact info under        Signed, Vickie Epley, MD  12/16/2021, 9:05 AM

## 2021-12-16 NOTE — Progress Notes (Signed)
Patient CBG = 64 before lunch check. Patient declined orange juice as her lunch tray had just been delivered. Will repeat CBG at next routine check in 4 hours
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
## 2021-12-16 NOTE — Progress Notes (Addendum)
ANTICOAGULATION CONSULT NOTE - Follow Up Consult  Pharmacy Consult for apixaban>heparin  Indication: hx of pulmonary embolus  No Known Allergies  Patient Measurements: Height: '3\' 8"'$  (111.8 cm) Weight: 81.7 kg (180 lb 1.9 oz) IBW/kg (Calculated) : 8.7 Heparin Dosing Weight: formula incorrect due to height of 3 feet 8 inches   Vital Signs: Temp: 97.6 F (36.4 C) (09/10 0418) Temp Source: Oral (09/10 0418) BP: 118/80 (09/10 0418) Pulse Rate: 75 (09/10 0418)  Labs: Recent Labs    12/14/21 0557 12/15/21 0440 12/15/21 1906 12/15/21 1907 12/16/21 0827  HGB 8.9* 7.1*  --  10.5* 10.8*  HCT 27.7* 21.1*  --  30.8* 33.1*  PLT 93* 97*  --   --  110*  APTT  --  190* 79*  --  85*  HEPARINUNFRC  --  >1.10*  --   --  0.23*  CREATININE 0.59 0.56  --   --  0.59     Estimated Creatinine Clearance: 50.3 mL/min (by C-G formula based on SCr of 0.59 mg/dL).   Medications:  Infusions:   heparin 700 Units/hr (12/16/21 0320)     Assessment: 51 y.o female admitted for cardiac arrest secondary to prolonged Qtc. Patient has a history of 3 previous PEs with most recent in April 2023. 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 On Eliquis PTA.   Eliquis restarted 9/3, and last dose was on 9/8 '@0922'$  Transition back to IV heparin for ICD placement next week Plan to stop midnight Monday.  aPTT therapeutic at 85, heparin level 0.23 now subtherapeutic. Hgb improved 10.8 s/p 1 x PRBC 9/9. Will need Hgb >9 and Plt >100 prior to surgery.Given therapeutic aPTT, will increase slightly to target middle of goal range. Will follow with daily heparin level tomorrow. 9/11 am. Some oozing around IV site but otherwise no bleeding noted or issues with infusion per RN.   Goal of Therapy:  Heparin level 0.3-0.7 units/ml APTT: 66-102 Monitor platelets by anticoagulation protocol: Yes   Plan:  Increasing heparin infusion slightly to 750 units/hr Check daily heparin level  while on heparin  Continue to monitor H&H and platelets  Eliseo Gum, PharmD PGY1 Pharmacy Resident   12/16/2021  9:55 AM

## 2021-12-17 DIAGNOSIS — I469 Cardiac arrest, cause unspecified: Secondary | ICD-10-CM | POA: Diagnosis not present

## 2021-12-17 DIAGNOSIS — R7989 Other specified abnormal findings of blood chemistry: Secondary | ICD-10-CM | POA: Diagnosis not present

## 2021-12-17 DIAGNOSIS — I4729 Other ventricular tachycardia: Secondary | ICD-10-CM | POA: Diagnosis not present

## 2021-12-17 DIAGNOSIS — J9601 Acute respiratory failure with hypoxia: Secondary | ICD-10-CM | POA: Diagnosis not present

## 2021-12-17 LAB — CBC WITH DIFFERENTIAL/PLATELET
Abs Immature Granulocytes: 0.04 10*3/uL (ref 0.00–0.07)
Basophils Absolute: 0.1 10*3/uL (ref 0.0–0.1)
Basophils Relative: 1 %
Eosinophils Absolute: 0.2 10*3/uL (ref 0.0–0.5)
Eosinophils Relative: 2 %
HCT: 31.7 % — ABNORMAL LOW (ref 36.0–46.0)
Hemoglobin: 10.5 g/dL — ABNORMAL LOW (ref 12.0–15.0)
Immature Granulocytes: 0 %
Lymphocytes Relative: 14 %
Lymphs Abs: 1.3 10*3/uL (ref 0.7–4.0)
MCH: 33.3 pg (ref 26.0–34.0)
MCHC: 33.1 g/dL (ref 30.0–36.0)
MCV: 100.6 fL — ABNORMAL HIGH (ref 80.0–100.0)
Monocytes Absolute: 0.6 10*3/uL (ref 0.1–1.0)
Monocytes Relative: 6 %
Neutro Abs: 7.3 10*3/uL (ref 1.7–7.7)
Neutrophils Relative %: 77 %
Platelets: 105 10*3/uL — ABNORMAL LOW (ref 150–400)
RBC: 3.15 MIL/uL — ABNORMAL LOW (ref 3.87–5.11)
RDW: 22.5 % — ABNORMAL HIGH (ref 11.5–15.5)
WBC: 9.5 10*3/uL (ref 4.0–10.5)
nRBC: 0 % (ref 0.0–0.2)

## 2021-12-17 LAB — GLUCOSE, CAPILLARY
Glucose-Capillary: 52 mg/dL — ABNORMAL LOW (ref 70–99)
Glucose-Capillary: 58 mg/dL — ABNORMAL LOW (ref 70–99)
Glucose-Capillary: 60 mg/dL — ABNORMAL LOW (ref 70–99)
Glucose-Capillary: 67 mg/dL — ABNORMAL LOW (ref 70–99)
Glucose-Capillary: 78 mg/dL (ref 70–99)
Glucose-Capillary: 80 mg/dL (ref 70–99)
Glucose-Capillary: 81 mg/dL (ref 70–99)

## 2021-12-17 LAB — COMPREHENSIVE METABOLIC PANEL
ALT: 57 U/L — ABNORMAL HIGH (ref 0–44)
AST: 163 U/L — ABNORMAL HIGH (ref 15–41)
Albumin: 1.7 g/dL — ABNORMAL LOW (ref 3.5–5.0)
Alkaline Phosphatase: 500 U/L — ABNORMAL HIGH (ref 38–126)
Anion gap: 7 (ref 5–15)
BUN: 5 mg/dL — ABNORMAL LOW (ref 6–20)
CO2: 22 mmol/L (ref 22–32)
Calcium: 8.2 mg/dL — ABNORMAL LOW (ref 8.9–10.3)
Chloride: 109 mmol/L (ref 98–111)
Creatinine, Ser: 0.51 mg/dL (ref 0.44–1.00)
GFR, Estimated: >60 mL/min (ref >60)
Glucose, Bld: 78 mg/dL (ref 70–99)
Potassium: 3.8 mmol/L (ref 3.5–5.1)
Sodium: 138 mmol/L (ref 135–145)
Total Bilirubin: 1.5 mg/dL — ABNORMAL HIGH (ref 0.3–1.2)
Total Protein: 5.6 g/dL — ABNORMAL LOW (ref 6.5–8.1)

## 2021-12-17 LAB — HEPARIN LEVEL (UNFRACTIONATED)
Heparin Unfractionated: 0.13 IU/mL — ABNORMAL LOW (ref 0.30–0.70)
Heparin Unfractionated: 0.19 IU/mL — ABNORMAL LOW (ref 0.30–0.70)

## 2021-12-17 LAB — PHOSPHORUS: Phosphorus: 3.9 mg/dL (ref 2.5–4.6)

## 2021-12-17 LAB — MAGNESIUM: Magnesium: 1.9 mg/dL (ref 1.7–2.4)

## 2021-12-17 MED ORDER — CHLORHEXIDINE GLUCONATE 4 % EX LIQD
60.0000 mL | Freq: Once | CUTANEOUS | Status: AC
  Administered 2021-12-17: 4 via TOPICAL
  Filled 2021-12-17: qty 60

## 2021-12-17 MED ORDER — METOPROLOL TARTRATE 100 MG PO TABS
100.0000 mg | ORAL_TABLET | Freq: Once | ORAL | Status: AC
  Administered 2021-12-17: 100 mg via ORAL
  Filled 2021-12-17: qty 1

## 2021-12-17 MED ORDER — ADULT MULTIVITAMIN W/MINERALS CH
1.0000 | ORAL_TABLET | Freq: Two times a day (BID) | ORAL | Status: DC
  Administered 2021-12-18 – 2021-12-20 (×5): 1 via ORAL
  Filled 2021-12-17 (×5): qty 1

## 2021-12-17 MED ORDER — CHLORHEXIDINE GLUCONATE 4 % EX LIQD
60.0000 mL | Freq: Once | CUTANEOUS | Status: AC
  Administered 2021-12-18: 4 via TOPICAL

## 2021-12-17 MED ORDER — CALCIUM CARBONATE ANTACID 500 MG PO CHEW
200.0000 mg | CHEWABLE_TABLET | Freq: Three times a day (TID) | ORAL | Status: DC
  Administered 2021-12-17 – 2021-12-20 (×7): 200 mg via ORAL
  Filled 2021-12-17 (×8): qty 1

## 2021-12-17 MED ORDER — SODIUM CHLORIDE 0.9 % IV BOLUS
500.0000 mL | Freq: Once | INTRAVENOUS | Status: AC
  Administered 2021-12-17: 500 mL via INTRAVENOUS

## 2021-12-17 MED ORDER — SODIUM CHLORIDE 0.9 % IV SOLN: INTRAVENOUS | Status: DC

## 2021-12-17 MED ORDER — SODIUM CHLORIDE 0.9 % IV SOLN: 80.0000 mg | INTRAVENOUS | Status: AC

## 2021-12-17 MED ORDER — CEFAZOLIN SODIUM-DEXTROSE 2-4 GM/100ML-% IV SOLN: 2.0000 g | INTRAVENOUS | Status: AC

## 2021-12-17 NOTE — Progress Notes (Signed)
Telemetry/chart check Telemetry is SR 70's, no VT, rare PVCs EKG yesterday with motion, QT perhaps better Lytes are OK LFTs remain abnormal (pre-admission were as well) H/H stabilized  CDiff tx completed Abn TSH felt to be sick euthyroid, on synthroid  Plan for coronary CTa today  S-ICD tomorrow  NPO after MN and heparin gtt off at MN  [facility], PA-C

## 2021-12-17 NOTE — Progress Notes (Signed)
OT Cancellation Note  Patient Details Name: Mallory Ayers MRN: [id number] DOB: Feb 04, 1971   Cancelled Treatment:    Reason Eval/Treat Not Completed: Other (comment) Patient politely declining OT services at this time, stating she feels like she is back to her level of independence and is transferring back and forth to her power chair without issue. OT offering additional strengthening measures, which patient politely deferred. Patient to remain on OT caseload, informing patient that OT would check on patient post-pacemaker to assess any other acute needs. Patient in agreement with plan.  Corinne Ports E. Iain Sawchuk, OTR/L Acute Rehabilitation Services [phone]   Ascencion Dike 12/17/2021, 8:23 AM
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
## 2021-12-17 NOTE — Progress Notes (Signed)
ANTICOAGULATION CONSULT NOTE - Follow Up Consult  Pharmacy Consult for apixaban>heparin  Indication: hx of pulmonary embolus  No Known Allergies  Patient Measurements: Height: '3\' 8"'$  (111.8 cm) Weight: (P) 82.7 kg (182 lb 5.1 oz) IBW/kg (Calculated) : 8.7 Heparin Dosing Weight: formula incorrect due to height of 3 feet 8 inches   Vital Signs: Temp: (P) 97.7 F (36.5 C) (09/11 0417) BP: 121/79 (09/11 0417)  Labs: Recent Labs    12/15/21 0440 12/15/21 1906 12/15/21 1907 12/16/21 0827 12/17/21 0427  HGB 7.1*  --  10.5* 10.8* 10.5*  HCT 21.1*  --  30.8* 33.1* 31.7*  PLT 97*  --   --  110* 105*  APTT 190* 79*  --  85*  --   HEPARINUNFRC >1.10*  --   --  0.23* 0.13*  CREATININE 0.56  --   --  0.59 0.51     Estimated Creatinine Clearance: 50.3 mL/min (by C-G formula based on SCr of 0.51 mg/dL).   Medications:  Infusions:   [START ON 12/18/2021] sodium chloride     [START ON 12/18/2021]  ceFAZolin (ANCEF) IV     heparin 750 Units/hr (12/17/21 0346)     Assessment: 51 y.o female admitted for cardiac arrest secondary to prolonged Qtc. Patient has a history of 3 previous PEs with most recent in April 2023. 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 On Eliquis PTA.   Eliquis restarted 9/3, and last dose was on 9/8 '@0922'$  Transition back to IV heparin for ICD placement .  12/17/21:  Heparin level is down to 0.13 on heparin drip 750 ut/hr.  No issues with heparin infusion and the oozing at dressing site noted on 12/16/21 is about the same,  no active bleeding per RN's report. Hgb  7.1 s/p 1 PRBC>  10.8>10.5   improved PLT 90s>> 110 -105.    Heparin set to stop tonight at midnight, for ICD placement tomorrow 12/18/21.  Will increase heparin rate to target therapeutic level.   Goal of Therapy:  Heparin level 0.3-0.7 units/ml Monitor platelets by anticoagulation protocol: Yes   Plan:  Increase heparin infusion to 950 units/hr Check  6 hour  heparin level. Daily heparin level while on heparin  Continue to monitor H&H and platelets  Thank you for allowing pharmacy to be part of this patients care team  Nicole Cella, [place] Pharmacist [phone] 12/17/2021  8:34 AM   Please check AMION for all [place] phone numbers After 10:00 PM, call [place] [phone]
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
## 2021-12-17 NOTE — Progress Notes (Signed)
ANTICOAGULATION CONSULT NOTE - Follow Up Consult  Pharmacy Consult for apixaban>heparin  Indication: hx of pulmonary embolus  No Known Allergies  Patient Measurements: Height: '3\' 8"'$  (111.8 cm) Weight: (P) 82.7 kg (182 lb 5.1 oz) IBW/kg (Calculated) : 8.7 Heparin Dosing Weight: formula incorrect due to height of 3 feet 8 inches   Vital Signs: Temp: 97.8 F (36.6 C) (09/11 1101) Temp Source: Oral (09/11 1101) BP: 91/64 (09/11 1357) Pulse Rate: 75 (09/11 1112)  Labs: Recent Labs    12/15/21 0440 12/15/21 1906 12/15/21 1907 12/16/21 0827 12/17/21 0427 12/17/21 1617  HGB 7.1*  --  10.5* 10.8* 10.5*  --   HCT 21.1*  --  30.8* 33.1* 31.7*  --   PLT 97*  --   --  110* 105*  --   APTT 190* 79*  --  85*  --   --   HEPARINUNFRC >1.10*  --   --  0.23* 0.13* 0.19*  CREATININE 0.56  --   --  0.59 0.51  --      Estimated Creatinine Clearance: 50.3 mL/min (by C-G formula based on SCr of 0.51 mg/dL).   Medications:  Infusions:   [START ON 12/18/2021] sodium chloride     [START ON 12/18/2021]  ceFAZolin (ANCEF) IV     heparin 950 Units/hr (12/17/21 1234)     Assessment: 51 y.o female admitted for cardiac arrest secondary to prolonged Qtc. Patient has a history of 3 previous PEs with most recent in April 2023. 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 On Eliquis PTA.   Eliquis restarted 9/3, and last dose was on 9/8 '@0922'$  Transition back to IV heparin for ICD placement .  Heparin level low this PM - > 0.19 (up from 0.13)   Goal of Therapy:  Heparin level 0.3-0.7 units/ml Monitor platelets by anticoagulation protocol: Yes   Plan:  Increase heparin infusion to 1250 units/hr Heparin off at midnight - follow up after procedure  Thank you Anette Guarneri, PharmD 12/17/2021  4:48 PM   Please check AMION for all [place] phone numbers After 10:00 PM, call [address]

## 2021-12-17 NOTE — Progress Notes (Signed)
  Patient given beta blocker to induce lower HR. MD ordered parameters   12/17/21 1357  Assess: MEWS Score  BP 91/64  MAP (mmHg) 75  ECG Heart Rate (!) 56  Resp 12  Level of Consciousness Alert  SpO2 99 %  O2 Device Room Air  Assess: MEWS Score  MEWS Temp 0  MEWS Systolic 1  MEWS Pulse 0  MEWS RR 1  MEWS LOC 0  MEWS Score 2  MEWS Score Color Yellow  Assess: if the MEWS score is Yellow or Red  Were vital signs taken at a resting state? Yes  Focused Assessment No change from prior assessment  Does the patient meet 2 or more of the SIRS criteria? No  MEWS guidelines implemented *See Row Information* No, other (Comment) (patient heart rate is in 50s due to medication to prepare for test. expected result)  Document  Progress note created (see row info) Yes  Assess: SIRS CRITERIA  SIRS Temperature  0  SIRS Pulse 0  SIRS Respirations  0  SIRS WBC 1  SIRS Score Sum  1

## 2021-12-17 NOTE — Progress Notes (Signed)
Hypoglycemic Event  CBG: 52  Treatment: 4 oz juice/soda  Symptoms: None  Follow-up CBG: Time: CBG Result:  Possible Reasons for Event: Inadequate meal intake  Comments/MD notified:    Randell Loop

## 2021-12-18 ENCOUNTER — Encounter (HOSPITAL_COMMUNITY): Payer: Self-pay | Admitting: Critical Care Medicine

## 2021-12-18 ENCOUNTER — Other Ambulatory Visit: Payer: Self-pay | Admitting: Oncology

## 2021-12-18 ENCOUNTER — Encounter (HOSPITAL_COMMUNITY)
Disposition: A | Discharge status: Home / Self Care | Payer: Self-pay | Source: Ambulatory Visit | Attending: Internal Medicine

## 2021-12-18 ENCOUNTER — Encounter (HOSPITAL_COMMUNITY): Payer: Self-pay | Admitting: Anesthesiology

## 2021-12-18 ENCOUNTER — Ambulatory Visit (HOSPITAL_COMMUNITY): Admit: 2021-12-18 | Payer: Medicare HMO | Admitting: Cardiology

## 2021-12-18 DIAGNOSIS — I469 Cardiac arrest, cause unspecified: Secondary | ICD-10-CM | POA: Diagnosis not present

## 2021-12-18 DIAGNOSIS — I959 Hypotension, unspecified: Secondary | ICD-10-CM

## 2021-12-18 DIAGNOSIS — J9601 Acute respiratory failure with hypoxia: Secondary | ICD-10-CM | POA: Diagnosis not present

## 2021-12-18 DIAGNOSIS — I4729 Other ventricular tachycardia: Secondary | ICD-10-CM | POA: Diagnosis not present

## 2021-12-18 DIAGNOSIS — R7989 Other specified abnormal findings of blood chemistry: Secondary | ICD-10-CM | POA: Diagnosis not present

## 2021-12-18 LAB — HEPARIN LEVEL (UNFRACTIONATED): Heparin Unfractionated: <0.1 IU/mL — ABNORMAL LOW (ref 0.30–0.70)

## 2021-12-18 LAB — COMPREHENSIVE METABOLIC PANEL
ALT: 54 U/L — ABNORMAL HIGH (ref 0–44)
AST: 155 U/L — ABNORMAL HIGH (ref 15–41)
Albumin: 1.6 g/dL — ABNORMAL LOW (ref 3.5–5.0)
Alkaline Phosphatase: 516 U/L — ABNORMAL HIGH (ref 38–126)
Anion gap: 8 (ref 5–15)
BUN: 6 mg/dL (ref 6–20)
CO2: 21 mmol/L — ABNORMAL LOW (ref 22–32)
Calcium: 8 mg/dL — ABNORMAL LOW (ref 8.9–10.3)
Chloride: 108 mmol/L (ref 98–111)
Creatinine, Ser: 0.64 mg/dL (ref 0.44–1.00)
GFR, Estimated: >60 mL/min (ref >60)
Glucose, Bld: 115 mg/dL — ABNORMAL HIGH (ref 70–99)
Potassium: 3.6 mmol/L (ref 3.5–5.1)
Sodium: 137 mmol/L (ref 135–145)
Total Bilirubin: 1.3 mg/dL — ABNORMAL HIGH (ref 0.3–1.2)
Total Protein: 5.4 g/dL — ABNORMAL LOW (ref 6.5–8.1)

## 2021-12-18 LAB — CBC WITH DIFFERENTIAL/PLATELET
Abs Immature Granulocytes: 0.03 10*3/uL (ref 0.00–0.07)
Abs Immature Granulocytes: 0.03 10*3/uL (ref 0.00–0.07)
Basophils Absolute: 0 10*3/uL (ref 0.0–0.1)
Basophils Absolute: 0 10*3/uL (ref 0.0–0.1)
Basophils Relative: 1 %
Basophils Relative: 1 %
Eosinophils Absolute: 0.1 10*3/uL (ref 0.0–0.5)
Eosinophils Absolute: 0 10*3/uL (ref 0.0–0.5)
Eosinophils Relative: 1 %
Eosinophils Relative: 1 %
HCT: 31.6 % — ABNORMAL LOW (ref 36.0–46.0)
HCT: 30.2 % — ABNORMAL LOW (ref 36.0–46.0)
Hemoglobin: 10.4 g/dL — ABNORMAL LOW (ref 12.0–15.0)
Hemoglobin: 10.3 g/dL — ABNORMAL LOW (ref 12.0–15.0)
Immature Granulocytes: 0 %
Immature Granulocytes: 1 %
Lymphocytes Relative: 9 %
Lymphocytes Relative: 7 %
Lymphs Abs: 0.6 10*3/uL — ABNORMAL LOW (ref 0.7–4.0)
Lymphs Abs: 0.4 10*3/uL — ABNORMAL LOW (ref 0.7–4.0)
MCH: 33.5 pg (ref 26.0–34.0)
MCH: 34.2 pg — ABNORMAL HIGH (ref 26.0–34.0)
MCHC: 32.9 g/dL (ref 30.0–36.0)
MCHC: 34.1 g/dL (ref 30.0–36.0)
MCV: 101.9 fL — ABNORMAL HIGH (ref 80.0–100.0)
MCV: 100.3 fL — ABNORMAL HIGH (ref 80.0–100.0)
Monocytes Absolute: 0.4 10*3/uL (ref 0.1–1.0)
Monocytes Absolute: 0.3 10*3/uL (ref 0.1–1.0)
Monocytes Relative: 6 %
Monocytes Relative: 4 %
Neutro Abs: 5.7 10*3/uL (ref 1.7–7.7)
Neutro Abs: 5.6 10*3/uL (ref 1.7–7.7)
Neutrophils Relative %: 83 %
Neutrophils Relative %: 86 %
Platelets: 105 10*3/uL — ABNORMAL LOW (ref 150–400)
Platelets: 100 10*3/uL — ABNORMAL LOW (ref 150–400)
RBC: 3.1 MIL/uL — ABNORMAL LOW (ref 3.87–5.11)
RBC: 3.01 MIL/uL — ABNORMAL LOW (ref 3.87–5.11)
RDW: 22.5 % — ABNORMAL HIGH (ref 11.5–15.5)
RDW: 22.5 % — ABNORMAL HIGH (ref 11.5–15.5)
Smear Review: NORMAL
WBC: 6.9 10*3/uL (ref 4.0–10.5)
WBC: 6.4 10*3/uL (ref 4.0–10.5)
nRBC: 0 % (ref 0.0–0.2)
nRBC: 0 % (ref 0.0–0.2)

## 2021-12-18 LAB — GLUCOSE, CAPILLARY
Glucose-Capillary: 135 mg/dL — ABNORMAL HIGH (ref 70–99)
Glucose-Capillary: 65 mg/dL — ABNORMAL LOW (ref 70–99)
Glucose-Capillary: 69 mg/dL — ABNORMAL LOW (ref 70–99)
Glucose-Capillary: 69 mg/dL — ABNORMAL LOW (ref 70–99)
Glucose-Capillary: 75 mg/dL (ref 70–99)
Glucose-Capillary: 82 mg/dL (ref 70–99)
Glucose-Capillary: 82 mg/dL (ref 70–99)
Glucose-Capillary: 83 mg/dL (ref 70–99)
Glucose-Capillary: 93 mg/dL (ref 70–99)
Glucose-Capillary: 96 mg/dL (ref 70–99)

## 2021-12-18 LAB — HCG, SERUM, QUALITATIVE: Preg, Serum: NEGATIVE

## 2021-12-18 LAB — PHOSPHORUS: Phosphorus: 3.3 mg/dL (ref 2.5–4.6)

## 2021-12-18 LAB — MAGNESIUM: Magnesium: 1.7 mg/dL (ref 1.7–2.4)

## 2021-12-18 SURGERY — SUBQ ICD IMPLANT
Anesthesia: General

## 2021-12-18 MED ORDER — SODIUM CHLORIDE 0.9 % IV BOLUS
1000.0000 mL | Freq: Once | INTRAVENOUS | Status: AC
  Administered 2021-12-18: 1000 mL via INTRAVENOUS

## 2021-12-18 MED ORDER — DEXTROSE 50 % IV SOLN: 25.0000 mL | Freq: Once | INTRAVENOUS | Status: AC

## 2021-12-18 MED ORDER — DEXTROSE 50 % IV SOLN
1.0000 | Freq: Once | INTRAVENOUS | Status: AC
  Administered 2021-12-18: 50 mL via INTRAVENOUS
  Filled 2021-12-18: qty 50

## 2021-12-18 MED ORDER — KCL IN DEXTROSE-NACL 20-5-0.9 MEQ/L-%-% IV SOLN
INTRAVENOUS | Status: DC
  Filled 2021-12-18 (×3): qty 1000

## 2021-12-18 MED ORDER — DEXTROSE 50 % IV SOLN
INTRAVENOUS | Status: AC
  Administered 2021-12-18: 25 mL via INTRAVENOUS
  Filled 2021-12-18: qty 50

## 2021-12-18 NOTE — Progress Notes (Signed)
PT Cancellation Note  Patient Details Name: ASPEN LAWRANCE MRN: [id number] DOB: 02-06-1971   Cancelled Treatment:    Reason Eval/Treat Not Completed: Medical issues which prohibited therapy (hypotension). Will check back tomorrow.   Leighton Roach, PT  Acute Rehab Services Secure chat preferred Office [place] 12/18/2021, 4:30 PM
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
## 2021-12-18 NOTE — Progress Notes (Signed)
Rounding Note    Patient Name: Mallory Ayers Date of Encounter: 12/18/2021  [place] Cardiologist: None   Subjective   Up chatting with family this AM. Feels OK other than chest wall pain from prior CPR.  Inpatient Medications    Scheduled Meds:  acetaminophen  1,000 mg Oral Q6H   aspirin  81 mg Oral Daily   calcium carbonate  200 mg of elemental calcium Oral TID   chlorhexidine  60 mL Topical Once   Chlorhexidine Gluconate Cloth  6 each Topical Daily   feeding supplement  237 mL Oral BID BM   gentamicin (GARAMYCIN) 80 mg in sodium chloride 0.9 % 500 mL irrigation  80 mg Irrigation On Call   insulin aspart  0-9 Units Subcutaneous Q4H   levothyroxine  100 mcg Oral Q0600   magnesium chloride  1 tablet Oral Daily   multivitamin with minerals  1 tablet Oral BID BM   polycarbophil  625 mg Oral Daily   pregabalin  225 mg Oral BID   sodium chloride flush  10-40 mL Intracatheter Q12H   thiamine  100 mg Oral Daily   Continuous Infusions:  sodium chloride      ceFAZolin (ANCEF) IV     PRN Meds: guaiFENesin, HYDROmorphone, methocarbamol, mouth rinse, sodium chloride flush   Vital Signs    Vitals:   12/17/21 1935 12/18/21 0408 12/18/21 0410 12/18/21 0443  BP: '93/61 98/69 98/69 '$   Pulse:   77   Resp: '14 19 19   '$ Temp: 98 F (36.7 C) 98 F (36.7 C) 98 F (36.7 C)   TempSrc: Oral Oral Oral   SpO2: 98% 99%    Weight:    84.3 kg  Height:        Intake/Output Summary (Last 24 hours) at 12/18/2021 0749 Last data filed at 12/18/2021 0430 Gross per 24 hour  Intake 950.99 ml  Output --  Net 950.99 ml       12/18/2021    4:43 AM 12/17/2021    4:17 AM 12/16/2021    4:18 AM  Last 3 Weights  Weight (lbs) 185 lb 13.6 oz 182 lb 5.1 oz 180 lb 1.9 oz  Weight (kg) 84.3 kg 82.7 kg 81.7 kg      Telemetry    Personally Reviewed  ECG    Personally Reviewed  Physical Exam   GEN: No acute distress.   Neck: No JVD Cardiac: RRR, no murmurs, rubs, or gallops.   Respiratory: Clear to auscultation bilaterally. GI: Soft, nontender, non-distended  MS: No edema; No deformity. Neuro:  Nonfocal  Psych: Normal affect. Sluggish.  Labs    High Sensitivity Troponin:   Recent Labs  Lab 12/05/21 2105 12/06/21 0445  TROPONINIHS 1,771* 1,789*      Chemistry Recent Labs  Lab 12/16/21 0827 12/17/21 0427 12/18/21 0449  NA 136 138 137  K 4.0 3.8 3.6  CL 107 109 108  CO2 22 22 21*  GLUCOSE 74 78 115*  BUN <5* 5* 6  CREATININE 0.59 0.51 0.64  CALCIUM 7.9* 8.2* 8.0*  MG 2.0 1.9 1.7  PROT 5.6* 5.6* 5.4*  ALBUMIN 1.8* 1.7* 1.6*  AST 180* 163* 155*  ALT 59* 57* 54*  ALKPHOS 518* 500* 516*  BILITOT 1.2 1.5* 1.3*  GFRNONAA >60 >60 >60  ANIONGAP '7 7 8     '$ Lipids No results for input(s): "CHOL", "TRIG", "HDL", "LABVLDL", "LDLCALC", "CHOLHDL" in the last 168 hours.  Hematology Recent Labs  Lab 12/16/21 0827  12/17/21 0427 12/18/21 0449  WBC 10.9* 9.5 6.9  RBC 3.28* 3.15* 3.10*  HGB 10.8* 10.5* 10.4*  HCT 33.1* 31.7* 31.6*  MCV 100.9* 100.6* 101.9*  MCH 32.9 33.3 33.5  MCHC 32.6 33.1 32.9  RDW 22.9* 22.5* 22.5*  PLT 110* 105* 105*    Thyroid No results for input(s): "TSH", "FREET4" in the last 168 hours.  BNPNo results for input(s): "BNP", "PROBNP" in the last 168 hours.  DDimer No results for input(s): "DDIMER" in the last 168 hours.   Radiology    No results found.    Assessment & Plan    51yo woman with bilateral traumatic AKA, OSA, hx of provoked PE on eliquis, chronic pain, breast ca s/p R mastectomy with L sided port who presented with [facility] in setting of significantly prolonged QT.  #[facility] #VT/VF #Prolonged QT Suspect QT prolongation from cancer therapies.  Hgb stable after PRBC tranfusion. Plan for platelet transfusion prior to implant. Tentatively planning for S-ICD implant this afternoon.  #Anemia #Thrombocytopenia S/p PRBC  transfusion, feeling better.  Keep NPO today.   For questions or updates, please  contact [place] Please consult [email or web address] for contact info under        Signed, Vickie Epley, MD  12/18/2021, 7:49 AM

## 2021-12-18 NOTE — Progress Notes (Signed)
Hypoglycemic Event  CBG: 69  Treatment: 8 oz of juice   Symptoms: None  Follow-up CBG: Time: 0903 CBG Result:86  Possible Reasons for Event: Unknown     Mallory Ayers Maxie Barb- [address]

## 2021-12-19 DIAGNOSIS — A0472 Enterocolitis due to Clostridium difficile, not specified as recurrent: Secondary | ICD-10-CM | POA: Diagnosis present

## 2021-12-19 DIAGNOSIS — I4729 Other ventricular tachycardia: Secondary | ICD-10-CM | POA: Diagnosis not present

## 2021-12-19 DIAGNOSIS — I469 Cardiac arrest, cause unspecified: Secondary | ICD-10-CM | POA: Diagnosis not present

## 2021-12-19 DIAGNOSIS — E8809 Other disorders of plasma-protein metabolism, not elsewhere classified: Secondary | ICD-10-CM | POA: Diagnosis present

## 2021-12-19 DIAGNOSIS — J9601 Acute respiratory failure with hypoxia: Secondary | ICD-10-CM | POA: Diagnosis not present

## 2021-12-19 LAB — COMPREHENSIVE METABOLIC PANEL
ALT: 53 U/L — ABNORMAL HIGH (ref 0–44)
AST: 172 U/L — ABNORMAL HIGH (ref 15–41)
Albumin: <1.5 g/dL — ABNORMAL LOW (ref 3.5–5.0)
Alkaline Phosphatase: 503 U/L — ABNORMAL HIGH (ref 38–126)
Anion gap: 8 (ref 5–15)
BUN: <5 mg/dL — ABNORMAL LOW (ref 6–20)
CO2: 20 mmol/L — ABNORMAL LOW (ref 22–32)
Calcium: 7.6 mg/dL — ABNORMAL LOW (ref 8.9–10.3)
Chloride: 112 mmol/L — ABNORMAL HIGH (ref 98–111)
Creatinine, Ser: 0.65 mg/dL (ref 0.44–1.00)
GFR, Estimated: >60 mL/min (ref >60)
Glucose, Bld: 84 mg/dL (ref 70–99)
Potassium: 3.7 mmol/L (ref 3.5–5.1)
Sodium: 140 mmol/L (ref 135–145)
Total Bilirubin: 1 mg/dL (ref 0.3–1.2)
Total Protein: 5.1 g/dL — ABNORMAL LOW (ref 6.5–8.1)

## 2021-12-19 LAB — CBC WITH DIFFERENTIAL/PLATELET
Abs Immature Granulocytes: 0.02 10*3/uL (ref 0.00–0.07)
Basophils Absolute: 0 10*3/uL (ref 0.0–0.1)
Basophils Relative: 0 %
Eosinophils Absolute: 0.1 10*3/uL (ref 0.0–0.5)
Eosinophils Relative: 2 %
HCT: 21.2 % — ABNORMAL LOW (ref 36.0–46.0)
Hemoglobin: 7.1 g/dL — ABNORMAL LOW (ref 12.0–15.0)
Immature Granulocytes: 0 %
Lymphocytes Relative: 5 %
Lymphs Abs: 0.2 10*3/uL — ABNORMAL LOW (ref 0.7–4.0)
MCH: 34 pg (ref 26.0–34.0)
MCHC: 33.5 g/dL (ref 30.0–36.0)
MCV: 101.4 fL — ABNORMAL HIGH (ref 80.0–100.0)
Monocytes Absolute: 0.2 10*3/uL (ref 0.1–1.0)
Monocytes Relative: 4 %
Neutro Abs: 4 10*3/uL (ref 1.7–7.7)
Neutrophils Relative %: 89 %
Platelets: 77 10*3/uL — ABNORMAL LOW (ref 150–400)
RBC: 2.09 MIL/uL — ABNORMAL LOW (ref 3.87–5.11)
RDW: 22.6 % — ABNORMAL HIGH (ref 11.5–15.5)
WBC: 4.5 10*3/uL (ref 4.0–10.5)
nRBC: 0 % (ref 0.0–0.2)

## 2021-12-19 LAB — CBC
HCT: 29.4 % — ABNORMAL LOW (ref 36.0–46.0)
Hemoglobin: 10 g/dL — ABNORMAL LOW (ref 12.0–15.0)
MCH: 34 pg (ref 26.0–34.0)
MCHC: 34 g/dL (ref 30.0–36.0)
MCV: 100 fL (ref 80.0–100.0)
Platelets: 112 10*3/uL — ABNORMAL LOW (ref 150–400)
RBC: 2.94 MIL/uL — ABNORMAL LOW (ref 3.87–5.11)
RDW: 22.5 % — ABNORMAL HIGH (ref 11.5–15.5)
WBC: 5.9 10*3/uL (ref 4.0–10.5)
nRBC: 0 % (ref 0.0–0.2)

## 2021-12-19 LAB — TYPE AND SCREEN
ABO/RH(D): O POS
Antibody Screen: POSITIVE
Donor AG Type: NEGATIVE
Donor AG Type: NEGATIVE
PT AG Type: NEGATIVE
Unit division: 0
Unit division: 0

## 2021-12-19 LAB — GLUCOSE, CAPILLARY
Glucose-Capillary: 75 mg/dL (ref 70–99)
Glucose-Capillary: 88 mg/dL (ref 70–99)
Glucose-Capillary: 94 mg/dL (ref 70–99)
Glucose-Capillary: 82 mg/dL (ref 70–99)
Glucose-Capillary: 80 mg/dL (ref 70–99)

## 2021-12-19 LAB — BPAM PLATELET PHERESIS
Blood Product Expiration Date: 202309142359
Unit Type and Rh: 6200

## 2021-12-19 LAB — BPAM RBC
Blood Product Expiration Date: 202310062359
Blood Product Expiration Date: 202310062359
ISSUE DATE / TIME: 202309091338
Unit Type and Rh: 5100
Unit Type and Rh: 5100

## 2021-12-19 LAB — CORTISOL: Cortisol, Plasma: 14.2 ug/dL

## 2021-12-19 LAB — MAGNESIUM: Magnesium: 1.5 mg/dL — ABNORMAL LOW (ref 1.7–2.4)

## 2021-12-19 LAB — PREPARE PLATELET PHERESIS: Unit division: 0

## 2021-12-19 LAB — PHOSPHORUS: Phosphorus: 3.3 mg/dL (ref 2.5–4.6)

## 2021-12-19 MED ORDER — APIXABAN 5 MG PO TABS
5.0000 mg | ORAL_TABLET | Freq: Two times a day (BID) | ORAL | Status: DC
  Administered 2021-12-19 – 2021-12-20 (×2): 5 mg via ORAL
  Filled 2021-12-19 (×2): qty 1

## 2021-12-19 MED ORDER — ALBUMIN HUMAN 5 % IV SOLN
25.0000 g | Freq: Four times a day (QID) | INTRAVENOUS | Status: DC
  Administered 2021-12-19: 25 g via INTRAVENOUS
  Administered 2021-12-19: 12.5 g via INTRAVENOUS
  Administered 2021-12-20: 25 g via INTRAVENOUS
  Filled 2021-12-19 (×4): qty 500

## 2021-12-19 MED ORDER — MAGNESIUM SULFATE 4 GM/100ML IV SOLN
4.0000 g | Freq: Once | INTRAVENOUS | Status: AC
  Administered 2021-12-19: 4 g via INTRAVENOUS
  Filled 2021-12-19: qty 100

## 2021-12-19 MED ORDER — POTASSIUM CHLORIDE CRYS ER 20 MEQ PO TBCR
40.0000 meq | EXTENDED_RELEASE_TABLET | Freq: Once | ORAL | Status: AC
  Administered 2021-12-19: 40 meq via ORAL
  Filled 2021-12-19: qty 2
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
## 2021-12-19 NOTE — Plan of Care (Signed)
  Problem: Activity: Goal: Risk for activity intolerance will decrease Outcome: Progressing   Problem: Safety: Goal: Ability to remain free from injury will improve Outcome: Progressing   

## 2021-12-19 NOTE — Progress Notes (Signed)
Rounding Note    Patient Name: Mallory Ayers Date of Encounter: 12/19/2021  [facility] Cardiologist: None   Subjective   AAO, denies any new complaints  Inpatient Medications    Scheduled Meds:  acetaminophen  1,000 mg Oral Q6H   aspirin  81 mg Oral Daily   calcium carbonate  200 mg of elemental calcium Oral TID   Chlorhexidine Gluconate Cloth  6 each Topical Daily   feeding supplement  237 mL Oral BID BM   gentamicin (GARAMYCIN) 80 mg in sodium chloride 0.9 % 500 mL irrigation  80 mg Irrigation On Call   insulin aspart  0-9 Units Subcutaneous Q4H   levothyroxine  100 mcg Oral Q0600   magnesium chloride  1 tablet Oral Daily   multivitamin with minerals  1 tablet Oral BID BM   polycarbophil  625 mg Oral Daily   pregabalin  225 mg Oral BID   sodium chloride flush  10-40 mL Intracatheter Q12H   thiamine  100 mg Oral Daily   Continuous Infusions:   ceFAZolin (ANCEF) IV     dextrose 5 % and 0.9 % NaCl with KCl 20 mEq/L 50 mL/hr at 12/19/21 0516   magnesium sulfate bolus IVPB     PRN Meds: guaiFENesin, HYDROmorphone, methocarbamol, mouth rinse, sodium chloride flush   Vital Signs    Vitals:   12/18/21 1450 12/18/21 1940 12/19/21 0408 12/19/21 0810  BP: '92/65 91/64 99/65 '$ 90/74  Pulse:  66 66 87  Resp: '19 18 14 19  '$ Temp:  98.6 F (37 C) 98.6 F (37 C) 97.7 F (36.5 C)  TempSrc:  Oral Oral Oral  SpO2:  98% 96% 94%  Weight:   83.9 kg   Height:        Intake/Output Summary (Last 24 hours) at 12/19/2021 0836 Last data filed at 12/18/2021 2300 Gross per 24 hour  Intake 1153.18 ml  Output --  Net 1153.18 ml      12/19/2021    4:08 AM 12/18/2021    4:43 AM 12/17/2021    4:17 AM  Last 3 Weights  Weight (lbs) 184 lb 15.5 oz 185 lb 13.6 oz 182 lb 5.1 oz  Weight (kg) 83.9 kg 84.3 kg 82.7 kg      Telemetry    SR 70's - Personally Reviewed  ECG    SR 75bpm, QT 488/QTc 544, manually by myself 429m > QTc 537 - Personally Reviewed  Physical Exam     Examined by Dr. MMyles Gip largely unchanged GEN: No acute distress.   Neck: No JVD Cardiac: RRR, no murmurs, rubs, or gallops.  Respiratory: CTA b/l. GI: Soft, nontender, non-distended  MS: b/l AKA's (remotely) Neuro:  Nonfocal  Psych: Normal affect   Labs    High Sensitivity Troponin:   Recent Labs  Lab 12/05/21 2105 12/06/21 0445  TROPONINIHS 1,771* 1,789*     Chemistry Recent Labs  Lab 12/17/21 0427 12/18/21 0449 12/19/21 0500  NA 138 137 140  K 3.8 3.6 3.7  CL 109 108 112*  CO2 22 21* 20*  GLUCOSE 78 115* 84  BUN 5* 6 <5*  CREATININE 0.51 0.64 0.65  CALCIUM 8.2* 8.0* 7.6*  MG 1.9 1.7 1.5*  PROT 5.6* 5.4* 5.1*  ALBUMIN 1.7* 1.6* <1.5*  AST 163* 155* 172*  ALT 57* 54* 53*  ALKPHOS 500* 516* 503*  BILITOT 1.5* 1.3* 1.0  GFRNONAA >60 >60 >60  ANIONGAP '7 8 8    '$ Lipids  No results for input(s): "  CHOL", "TRIG", "HDL", "LABVLDL", "LDLCALC", "CHOLHDL" in the last 168 hours.   Hematology Recent Labs  Lab 12/18/21 0449 12/18/21 1504 12/19/21 0500  WBC 6.9 6.4 4.5  RBC 3.10* 3.01* 2.09*  HGB 10.4* 10.3* 7.1*  HCT 31.6* 30.2* 21.2*  MCV 101.9* 100.3* 101.4*  MCH 33.5 34.2* 34.0  MCHC 32.9 34.1 33.5  RDW 22.5* 22.5* 22.6*  PLT 105* 100* 77*   Thyroid  No results for input(s): "TSH", "FREET4" in the last 168 hours.   BNPNo results for input(s): "BNP", "PROBNP" in the last 168 hours.  DDimer No results for input(s): "DDIMER" in the last 168 hours.   Radiology     Cardiac Studies   12/06/2021: TTE  1. Compared with the echo 07/2021, LVEF has increased slightly. There is  very mild hypokinesis of the basa anteroseptum and the basal to mid  inferoseptum which is unchanged from prior. No acute findings.   2. Left ventricular ejection fraction, by estimation, is 55 to 60%. The  left ventricle has normal function. The left ventricle demonstrates  regional wall motion abnormalities (see scoring diagram/findings for  description). Left ventricular  diastolic  parameters are consistent with Grade I diastolic dysfunction (impaired  relaxation).   3. Right ventricular systolic function is normal. The right ventricular  size is normal.   4. The mitral valve is normal in structure. Trivial mitral valve  regurgitation. No evidence of mitral stenosis.   5. The aortic valve is normal in structure. Aortic valve regurgitation is  not visualized. No aortic stenosis is present.   Comparison(s): No significant change from prior study.   07/10/21: TTE  1. GLS -9.4. Left ventricular ejection fraction, by estimation, is 55 to  60%. The left ventricle has normal function. The left ventricle has no  regional wall motion abnormalities. Left ventricular diastolic parameters  were normal.   2. Right ventricular systolic function is normal. The right ventricular  size is normal. There is normal pulmonary artery systolic pressure.   3. The mitral valve is normal in structure. No evidence of mitral valve  regurgitation. No evidence of mitral stenosis.   4. The aortic valve is normal in structure. Aortic valve regurgitation is  not visualized. No aortic stenosis is present.   5. The inferior vena cava is normal in size with greater than 50%  respiratory variability, suggesting right atrial pressure of 3 mmHg.   Patient Profile     51 y.o. female with a hx of traumatic b/l AKA, OSA, hx of provoked PE on Eliquis, chronic pain, breast cancer admitted with OOH arrest  Assessment & Plan    Long QT VF QT likely provoked by drugs, trazodone, zofran, compazine, among others and hypokalemia And perhaps noncompliance (over-taking) with meds per family  S-ICD implant cancelled 2/2 persistent hypotension Will need to follow clinically for plan Most likely plan life vest > out patient implant  K+ 3.7 Mag 1.5, replacement ordered   3. Abnormal Trops on admission Likely 2/2 arrest, CPR (unclear, but mention of one defibrillation by EMS) Coronary CT  cancelled with scanner down on Monday Will probably plan for ischemic eval out patient as well   4. Elevated TSH Felt to be sick euthyroid Getting synthroid  5. New and persistent hypotension Repeat CBC yesterday looked stable  Remains off heparin gtt H/H and plts this AM down significantly Deferred to IM team   For questions or updates, please contact [place] Please consult [email or web address] for contact info under  Signed, Baldwin [place], PA-C  12/19/2021, 8:36 AM

## 2021-12-19 NOTE — Plan of Care (Signed)
  Problem: Clinical Measurements: Goal: Respiratory complications will improve Outcome: Progressing   Problem: Activity: Goal: Risk for activity intolerance will decrease Outcome: Progressing   

## 2021-12-19 NOTE — Progress Notes (Signed)
PT Cancellation Note  Patient Details Name: Mallory Ayers MRN: [id number] DOB: Dec 22, 1970   Cancelled Treatment:    Reason Eval/Treat Not Completed: PT screened, no needs identified, will sign off. Pt reports she has been transferring independently and mobilizing around the room in her power wheelchair, RN confirms. Pt declines acute PT needs at this time, reporting she is back to her baseline. Acute PT will sign off. If the pt develops any mobility deficits during the remainder of this admission please re-consult.   Zenaida Niece 12/19/2021, 12:09 PM

## 2021-12-20 ENCOUNTER — Encounter: Payer: Self-pay | Admitting: Hematology and Oncology

## 2021-12-20 ENCOUNTER — Other Ambulatory Visit (HOSPITAL_COMMUNITY): Payer: Self-pay

## 2021-12-20 DIAGNOSIS — J9601 Acute respiratory failure with hypoxia: Secondary | ICD-10-CM | POA: Diagnosis not present

## 2021-12-20 DIAGNOSIS — I469 Cardiac arrest, cause unspecified: Secondary | ICD-10-CM | POA: Diagnosis not present

## 2021-12-20 DIAGNOSIS — A0472 Enterocolitis due to Clostridium difficile, not specified as recurrent: Secondary | ICD-10-CM

## 2021-12-20 DIAGNOSIS — R7401 Elevation of levels of liver transaminase levels: Secondary | ICD-10-CM | POA: Diagnosis not present

## 2021-12-20 DIAGNOSIS — I4581 Long QT syndrome: Secondary | ICD-10-CM

## 2021-12-20 LAB — CBC
HCT: 28.4 % — ABNORMAL LOW (ref 36.0–46.0)
Hemoglobin: 9.4 g/dL — ABNORMAL LOW (ref 12.0–15.0)
MCH: 33.6 pg (ref 26.0–34.0)
MCHC: 33.1 g/dL (ref 30.0–36.0)
MCV: 101.4 fL — ABNORMAL HIGH (ref 80.0–100.0)
Platelets: 97 10*3/uL — ABNORMAL LOW (ref 150–400)
RBC: 2.8 MIL/uL — ABNORMAL LOW (ref 3.87–5.11)
RDW: 22.6 % — ABNORMAL HIGH (ref 11.5–15.5)
WBC: 4.9 10*3/uL (ref 4.0–10.5)
nRBC: 0 % (ref 0.0–0.2)

## 2021-12-20 LAB — GLUCOSE, CAPILLARY
Glucose-Capillary: 71 mg/dL (ref 70–99)
Glucose-Capillary: 73 mg/dL (ref 70–99)
Glucose-Capillary: 75 mg/dL (ref 70–99)
Glucose-Capillary: 84 mg/dL (ref 70–99)
Glucose-Capillary: 92 mg/dL (ref 70–99)

## 2021-12-20 LAB — RENAL FUNCTION PANEL
Albumin: 2 g/dL — ABNORMAL LOW (ref 3.5–5.0)
Anion gap: 5 (ref 5–15)
BUN: 7 mg/dL (ref 6–20)
CO2: 21 mmol/L — ABNORMAL LOW (ref 22–32)
Calcium: 7.8 mg/dL — ABNORMAL LOW (ref 8.9–10.3)
Chloride: 114 mmol/L — ABNORMAL HIGH (ref 98–111)
Creatinine, Ser: 0.57 mg/dL (ref 0.44–1.00)
GFR, Estimated: >60 mL/min (ref >60)
Glucose, Bld: 80 mg/dL (ref 70–99)
Phosphorus: 3.5 mg/dL (ref 2.5–4.6)
Potassium: 4.3 mmol/L (ref 3.5–5.1)
Sodium: 140 mmol/L (ref 135–145)

## 2021-12-20 LAB — MAGNESIUM: Magnesium: 2.1 mg/dL (ref 1.7–2.4)

## 2021-12-20 MED ORDER — ASPIRIN 81 MG PO CHEW
81.0000 mg | CHEWABLE_TABLET | Freq: Every day | ORAL | 1 refills | Status: AC
  Filled 2021-12-20: qty 30, 30d supply, fill #0

## 2021-12-20 MED ORDER — ALBUMIN HUMAN 5 % IV SOLN
25.0000 g | Freq: Three times a day (TID) | INTRAVENOUS | Status: DC
  Administered 2021-12-20: 12.5 g via INTRAVENOUS
  Filled 2021-12-20 (×4): qty 500

## 2021-12-20 MED ORDER — HYDROMORPHONE HCL 4 MG PO TABS: 4.0000 mg | ORAL_TABLET | Freq: Four times a day (QID) | ORAL | 0 refills | Status: AC | PRN

## 2021-12-20 MED ORDER — HEPARIN SOD (PORK) LOCK FLUSH 100 UNIT/ML IV SOLN
500.0000 [IU] | INTRAVENOUS | Status: AC | PRN
  Administered 2021-12-20: 500 [IU]

## 2021-12-20 NOTE — Progress Notes (Signed)
Pt A/O X 4. Admitted for cardiac arrest. See note. Pain managed by prn. Denies SOB. Livevest placed on patient. Education provided on medications and follow up. Adequate for discharge per MD.

## 2021-12-20 NOTE — TOC Initial Note (Signed)
Transition of Care [facility]) - Initial/Assessment Note    Patient Details  Name: Mallory Ayers MRN: [id number] Date of Birth: [birth year]  Transition of Care [facility]) CM/SW Contact:    Zenon Mayo, RN Phone Number: 12/20/2021, 4:07 PM  Clinical Narrative:                  Txr from 1M Patient is for dc home today, she has been fitted with life vest.    Expected Discharge Plan: Home/Self Care Barriers to Discharge: No Barriers Identified   Patient Goals and CMS Choice Patient states their goals for this hospitalization and ongoing recovery are:: return home   Choice offered to / list presented to : NA  Expected Discharge Plan and Services Expected Discharge Plan: Home/Self Care   Discharge Planning Services: CM Consult Post Acute Care Choice: Durable Medical Equipment (life vest) Living arrangements for the past 2 months: Single Family Home Expected Discharge Date: 12/20/21                         [facility] Arranged: NA          Prior Living Arrangements/Services Living arrangements for the past 2 months: Single Family Home Lives with:: Spouse Patient language and need for interpreter reviewed:: Yes Do you feel safe going back to the place where you live?: Yes      Need for Family Participation in Patient Care: Yes (Comment) Care giver support system in place?: Yes (comment)   Criminal Activity/Legal Involvement Pertinent to Current Situation/Hospitalization: No - Comment as needed  Activities of Daily Living      Permission Sought/Granted                  Emotional Assessment       Orientation: : Oriented to Self, Oriented to Place, Oriented to  Time, Oriented to Situation Alcohol / Substance Use: Not Applicable Psych Involvement: No (comment)  Admission diagnosis:  Cardiac arrest [facility]) [I46.9] Patient Active Problem List   Diagnosis Date Noted   Long QT syndrome 12/20/2021   C. 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[place])    Acute respiratory failure with hypoxia ([place])    Cardiac arrest ([place]) 12/05/2021   Dehydration 11/27/2021   Genetic testing 10/02/2021   Abnormal CT of liver 09/24/2021   Diarrhea 09/12/2021   Decreased appetite 09/12/2021   Family history of breast cancer 08/10/2021   Seasonal allergic rhinitis 06/19/2021   Atopic dermatitis 06/19/2021   Chronic obstructive pulmonary disease ([place]) 06/19/2021   Encounter for surveillance of contraceptive pills 06/19/2021   Encounter for therapeutic drug level monitoring 06/19/2021   Gastro-esophageal reflux disease without esophagitis 06/19/2021   Generalized anxiety disorder 06/19/2021   Herpes zoster without complication 70/04/7492   Hypokalemia 06/19/2021   Impacted cerumen 06/19/2021   Insomnia 06/19/2021   Major depression, single episode 06/19/2021   Melena 06/19/2021   Menopause 06/19/2021   Other vitamin B12 deficiency anemias 06/19/2021   Phantom limb syndrome with pain ([place]) 06/19/2021   Rash and other nonspecific skin eruption 06/19/2021   Recurrent major depression in remission ([place]) 06/19/2021   Right hip pain 06/19/2021   Type 2 diabetes mellitus without complications ([place]) 49/67/5916   Unspecified lump in the right breast, unspecified quadrant 06/19/2021   Vitamin B12 deficiency anemia due to malabsorption with proteinuria 06/19/2021   Vitamin D deficiency 06/19/2021   Postoperative examination 06/05/2021   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   Malignant neoplasm of upper-outer quadrant of right breast in female, 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/2021    Class: Diagnosis of   Other pulmonary embolism without acute cor pulmonale ([place]) 04/23/2018   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 03/30/2014   Chronic pain syndrome 03/30/2014   History of pulmonary embolism 03/30/2014   Sliding hiatal hernia 03/30/2014    Hypothyroidism 03/30/2014   Mixed hyperlipidemia 03/30/2014   Elevated transaminase level 03/30/2014   Fatty liver 03/30/2014   Essential hypertension 03/30/2014   Amputee, 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 [place] [address] Phone: [phone] Fax: [phone]     Social Determinants of Health (SDOH) Interventions    Readmission Risk Interventions    12/20/2021    3:57 PM  Readmission Risk Prevention Plan  Transportation Screening Complete  Medication Review ([place]) Complete  PCP or Specialist appointment within 3-5 days of discharge Complete  HRI or [place] Complete  SW Recovery Care/Counseling Consult Complete  [place] Not Applicable

## 2021-12-20 NOTE — Progress Notes (Signed)
OT Cancellation Note and Discharge  Patient Details Name: Mallory Ayers MRN: [id number] DOB: 02-22-1971   Cancelled Treatment:    Reason Eval/Treat Not Completed: Other (comment). In to check on pt and she reports no issues with basic ADLs at this point and has A prn post D/C. We will sign off.  [place], OTR/L [facility] [phone] Office [phone]    Almon Register 12/20/2021, 8:25 AM

## 2021-12-20 NOTE — TOC Transition Note (Signed)
Transition of Care [facility]) - CM/SW Discharge Note   Patient Details  Name: Mallory Ayers MRN: [id number] Date of Birth: [birth year]  Transition of Care [facility]) CM/SW Contact:  Zenon Mayo, RN Phone Number: 12/20/2021, 4:08 PM   Clinical Narrative:    Patient is for dc today, she has been fitted for lifevest.   Final next level of care: Home/Self Care Barriers to Discharge: No Barriers Identified   Patient Goals and CMS Choice Patient states their goals for this hospitalization and ongoing recovery are:: return home   Choice offered to / list presented to : NA  Discharge Placement                       Discharge Plan and Services   Discharge Planning Services: CM Consult Post Acute Care Choice: Durable Medical Equipment (life vest)                    HH Arranged: NA          Social Determinants of Health (SDOH) Interventions     Readmission Risk Interventions    12/20/2021    3:57 PM  Readmission Risk Prevention Plan  Transportation Screening Complete  Medication Review (RN Care Manager) Complete  PCP or Specialist appointment within 3-5 days of discharge Complete  HRI or [place] Complete  SW Recovery Care/Counseling Consult Complete  [place] Not Applicable

## 2021-12-20 NOTE — Discharge Summary (Addendum)
Physician Discharge Summary  Mallory Ayers XLK:[id number] DOB: 10-21-1970 DOA: 12/05/2021  PCP: [name], MD  Admit date: 12/05/2021 Discharge date: 12/20/2021  Admitted From: Home Disposition: Home  Recommendations for Outpatient Follow-up:  Follow up with PCP in 1-2 weeks Please obtain BMP/CBC in one week Patient is being discharged with LifeVest.  She plans to follow-up with electrophysiology in the next couple weeks to discuss placement of ICD  Home Health: Equipment/Devices:  Discharge Condition: Stable CODE STATUS: full code Diet recommendation: Heart healthy  Brief/Interim Summary: 51 year old female with a history of bilateral lower extremity amputations, obesity status post sleeve gastrectomy in the past, breast cancer on chemotherapy, previous history of VTE on anticoagulation she was recently diagnosed with C. difficile and was on a course of fidaxomicin.  She initially presented to [facility] on 8/30 when she had collapsed and was found to be in cardiac arrest.  She was noted to have VT/VF and underwent CPR resuscitation.  Patient was intubated and on vasopressors, she was initially transferred to the ICU.  She had episodes of monomorphic V. tach and was started on lidocaine infusion.  As her condition improved, she was able to come off of vasopressors as well as lidocaine.  She was eventually extubated and transferred out of the ICU she was transitioned to [facility] service.  She was seen by electrophysiology and was initially considered for ICD.  Due to her multiple comorbidities, trouble with anemia/thrombocytopenia, labile blood pressures, decision was made to place a LifeVest on the patient for now and have her follow-up with EP in the next few weeks to discuss ICD placement once her acute issues have further resolved.  Discharge Diagnoses:  Principal Problem:   Cardiac arrest [facility]) Active Problems:   Malignant neoplasm of upper-outer quadrant of right breast in female,  estrogen receptor positive ([place])   Morbid obesity ([place])   History of pulmonary embolism   Hypothyroidism   Elevated transaminase level   Amputee, above knee - bilateral   Acute respiratory failure with hypoxia (HCC)   Polymorphic ventricular tachycardia (HCC)   C. difficile diarrhea   Hypoalbuminemia   Long QT syndrome  VF cardiac arrest 2/2 Long QT Mixed cardiogenic/septic shock, ruled in Prolonged QTc -EKG without signs of ischemia, long QT, Qtc +650. Likely R on T with long QT 2/2 to drug effects, -EP following feels like she will need an ICD -No PE on CTA. CT head negative. Holding TTM, normothermic.  -Episode of v tach during SBT 8/31 , stable on lidocaine drip and now off of lidocaine altogether -Presented in cardiogenic shock but following stabilization and grossly unchanged echo, EF 55-60%. CTA chest showed bibasilar and lingular atelectasis, improved from prior 07/2021. Lactate trending down. Off pressors 9/1.  -Qtc continues to improve> 668 -> 568 -> 464 -> 469 -> 574 -> 555 and was 550 on last check; continue to avoid Qtc prolonging meds and monitor closely with aggressive electrolyte replete -Seen by electrophysiology to be considered for ICD placement, but with multiple comorbidities and after discussion with patient, decision made to place LifeVest on patient for now and have her follow-up with EP to discuss ICD placement in the next few weeks when she has further recovered -We will also undergo ischemic evaluation as an outpatient -Seen by PT/OT and felt she was at her functional baseline.  No home health PT was recommended     C diff colitis- present on admission Hx of C diff - Cont Enteric precautions - completed  10 days of fidaxomycin 8/31 -Overall diarrhea seems to be improving   History of hypothyroidism Euthyroid Sick Syndrome - TSH 139, T4 5.2 - FT3 2.6 / FT4 low 0.4 -Reinitiated her levothyroxine 100 mcg p.o. daily - Recheck in 4-6 weeks     Hypokalemia Hyponatremia Hypocalcemia -Replaced   Hypotension with associated dizziness and lightheadedness -Unclear etiology -Hypoalbuminemia and low oncotic pressure may be playing a role -Cortisol level normal -Continue monitor blood pressures per protocol -Unfortunately cannot use TED hose given that she has bilateral BKA's -Overall blood pressures appear to be stabilizing   Elevated Transaminases  Elevated Alkaline Phosphatase Direct Hyperbilirubinemia -Cholestatic pattern, history of alcohol use disorder and possibility of transient hypoperfusion during cardiac arrest. No signs of shock liver, no abd pain.  -Transaminases remain mildly elevated, although they have trended down and are currently stable    At risk for malnutrition -Nutrition Status: Nutrition Problem: Increased nutrient needs Etiology: cancer and cancer related treatments Signs/Symptoms: estimated needs Interventions: Refer to RD note for recommendations   Hypoalbuminemia -Patient's albumin level <1.5 -Low oncotic pressure likely contributing to hypotension, also is developing some anasarca -She did receive albumin infusion during her hospital stay    Stage IB hormone receptor positive breast cancer  -Held chemo in 10/2021 due to side effects. On paclitaxel in the past, recent chemo with Adriamycin & cyclophosphamide over a month ago. -All treatment deferred until clinically stable; had previously been on hold due to intolerable side effects -Follow-up with oncology in outpatient setting   Rib Fractures 2/2 CPR -Acute anterior 2-7 right rib fractures and acute anterolateral 2-5 left rib fractures seen on CT. On Oxycodone 15 mg q6h at home. -C/w Tylenol and I will discontinue the ketorolac for now and is on hydromorphone 2 mg p.o. Q6 as needed for severe pain and will continue daily methocarbamol 500 mg p.o. every 8 as needed for muscle spasms and rib pain -Continue to monitor and she will need incentive  spirometry   Acute on Chronic anemia likely 2/2 chemo rule out bleeding Thrombocytopenia -transfuse for Hb <7 or hemodynamically significant bleeding -Continue vitamin B1 100 mg p.o. daily - H/H trended down to 7.1/21.1 so she was typed and screened and transfused 1 unit of PRBCs on 9/9 with follow up Hgb/Hct improved to 10.5/31.7.  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   Weight as of this encounter: 83.9 kg.  -Weight Loss and Dietary Counseling given  Discharge Instructions  Discharge Instructions     Diet - low sodium heart healthy   Complete by: As directed    For home use only DME Vest life vest   Complete by: As directed    Cardiac arrest Prolonged QT Anticipate ICD implant out patient Wear time 3 months Dr. Marthe Patch   Increase activity slowly   Complete by: As directed       Allergies as of 12/20/2021  No Known Allergies      Medication List     STOP taking these medications    cholestyramine 4 g packet Commonly known as: QUESTRAN   Dificid 200 MG Tabs tablet Generic drug: fidaxomicin   prochlorperazine 10 MG tablet Commonly known as: COMPAZINE   traZODone 100 MG tablet Commonly known as: DESYREL       TAKE these medications    aspirin 81 MG chewable tablet Chew 1 tablet (81 mg total) by mouth daily. Start taking on: December 21, 2021 Notes to patient: Take tomorrow morning  12/21/21   Belsomra 20 MG Tabs Generic drug: Suvorexant Take 1 tablet by mouth at bedtime as needed (for sleep).   budesonide-formoterol 160-4.5 MCG/ACT inhaler Commonly known as: SYMBICORT Inhale 2 puffs into the lungs daily as needed (For shortess of breath).   busPIRone 10 MG tablet Commonly known as: BUSPAR Take 15 mg by mouth 2 (two) times daily.   cyclobenzaprine 10 MG tablet Commonly known as: FLEXERIL Take 10 mg by mouth 2 (two) times daily as needed for muscle spasms.   Dodex 1000 MCG/ML injection Generic drug: cyanocobalamin Inject 100 mcg into the muscle every 30 (thirty) days.   Eliquis 5 MG Tabs tablet Generic drug: apixaban Take 5 mg by mouth 2 (two) times daily. Notes to patient: Take tonight @ bedtime 12/20/21   esomeprazole 40 MG capsule Commonly known as: NEXIUM Take 40 mg by mouth daily as needed (Heartburn).   FLUoxetine HCl 60 MG Tabs Take 60 mg by mouth daily.   HYDROmorphone 4 MG tablet Commonly known as: DILAUDID Take 4 mg by mouth every 6 (six) hours as needed for moderate pain.   levothyroxine 100 MCG tablet Commonly known as: SYNTHROID Take 100 mcg by mouth daily before breakfast. Notes to patient: Take tomorrow morning 12/21/21   loratadine 10 MG tablet Commonly known as: CLARITIN Take 10 mg by mouth daily as needed for allergies.   LORazepam 1 MG tablet Commonly known as: ATIVAN Take 1 mg by mouth every 8 (eight) hours as needed for anxiety.   multivitamins with iron Tabs tablet Take 1 tablet by mouth every morning. Notes to patient: Take tomorrow morning 12/21/21   norethindrone 0.35 MG tablet Commonly known as: MICRONOR Take 1 tablet by mouth daily.   ondansetron 4 MG disintegrating tablet Commonly known as: ZOFRAN-ODT Take 4 mg by mouth every 8 (eight) hours as needed for nausea or vomiting.   oxyCODONE 15 MG immediate release tablet Commonly known as: ROXICODONE Take 15 mg by mouth every 6 (six) hours as needed for pain.    potassium chloride SA 20 MEQ tablet Commonly known as: KLOR-CON M Take 20 mEq by mouth 2 (two) times daily. Notes to patient: Take tonight @ bedtime 12/20/21   pregabalin 225 MG capsule Commonly known as: LYRICA Take 225 mg by mouth 2 (two) times daily. Notes to patient: Take tonight @ bedtime 12/20/21               Durable Medical Equipment  (From admission, onward)           Start     Ordered   12/19/21 0000  For home use only DME Vest life vest       Comments: Cardiac arrest Prolonged QT Anticipate ICD implant out patient Wear time 3 months Dr. Marthe Patch   12/19/21 1706            Follow-up Information     Vickie Epley, MD. Schedule an  appointment as soon as possible for a visit in 2 week(s).   Specialties: Cardiology, Radiology Contact information: [place] [address] [phone]         [name], MD. Schedule an appointment as soon as possible for a visit in 1 week(s).   Specialty: Internal Medicine Why: GOMinette Brine information: [place] [address] [phone]                No Known Allergies  Consultations: Cardiology and electrophysiology PCCM   Procedures/Studies: ECHOCARDIOGRAM COMPLETE  Result Date: 12/06/2021    ECHOCARDIOGRAM REPORT   Patient Name:   JESSICAH CROLL Date of Exam: 12/06/2021 Medical Rec #:  [id number]   Height:       44.0 in Accession #:    [id number]  Weight:       180.1 lb Date of Birth:  1970/06/19    BSA:          1.426 m Patient Age:    51 years    BP:           82/58 mmHg Patient Gender: F           HR:           63 bpm. Exam Location:  Inpatient Procedure: 2D Echo, Cardiac Doppler, Color Doppler and Intracardiac            Opacification Agent                                 MODIFIED REPORT: This report was modified by Skeet Latch MD on 12/06/2021 due to wall motion                               abnormality is old.  Indications:     I42.9  Cardiomyopathy (unspecified)  History:         Patient has prior history of Echocardiogram examinations, most                  recent 07/10/2021. Risk Factors:Sleep Apnea and Hypertension.                  Breast Cancer - receiving chemotherapy.  Sonographer:     Eartha Inch Referring Phys:  Ina Homes, C Diagnosing Phys: Skeet Latch MD  Sonographer Comments: Echo performed with patient supine and on artificial respirator, patient is obese and Technically difficult study due to poor echo windows. Image acquisition challenging due to respiratory motion and Image acquisition challenging due to patient body habitus. IMPRESSIONS  1. Compared with the echo 07/2021, LVEF has increased slightly. There is very mild hypokinesis of the basa anteroseptum and the basal to mid inferoseptum which is unchanged from prior. No acute findings.  2. Left ventricular ejection fraction, by estimation, is 55 to 60%. The left ventricle has normal function. The left ventricle demonstrates regional wall motion abnormalities (see scoring diagram/findings for description). Left ventricular diastolic parameters are consistent with Grade I diastolic dysfunction (impaired relaxation).  3. Right ventricular systolic function is normal. The right ventricular size is normal.  4. The mitral valve is normal in structure. Trivial mitral valve regurgitation. No evidence of mitral stenosis.  5. The aortic valve is normal in structure. Aortic valve regurgitation is not visualized. No aortic stenosis is present. Comparison(s): No significant change from prior study.  FINDINGS  Left Ventricle: Left ventricular ejection fraction, by estimation, is 55 to 60%. The left ventricle has normal function. The left ventricle demonstrates regional wall motion abnormalities. Definity contrast agent was given IV to delineate the left ventricular endocardial borders. The left ventricular internal cavity size was normal in size. There is no left ventricular  hypertrophy. Left ventricular diastolic parameters are consistent with Grade I diastolic dysfunction (impaired relaxation). Indeterminate filling pressures.  LV Wall Scoring: The basal anteroseptal segment, mid inferoseptal segment, and basal inferoseptal segment are hypokinetic. The entire anterior wall, entire lateral wall, mid and distal anterior septum, entire apex, and entire inferior wall are normal. Right Ventricle: The right ventricular size is normal. No increase in right ventricular wall thickness. Right ventricular systolic function is normal. Left Atrium: Left atrial size was normal in size. Right Atrium: Right atrial size was normal in size. Pericardium: There is no evidence of pericardial effusion. Mitral Valve: The mitral valve is normal in structure. Trivial mitral valve regurgitation. No evidence of mitral valve stenosis. Tricuspid Valve: The tricuspid valve is normal in structure. Tricuspid valve regurgitation is not demonstrated. No evidence of tricuspid stenosis. Aortic Valve: The aortic valve is normal in structure. Aortic valve regurgitation is not visualized. No aortic stenosis is present. Aortic valve mean gradient measures 5.0 mmHg. Aortic valve peak gradient measures 8.1 mmHg. Aortic valve area, by VTI measures 2.23 cm. Pulmonic Valve: The pulmonic valve was normal in structure. Pulmonic valve regurgitation is not visualized. No evidence of pulmonic stenosis. Aorta: The aortic root is normal in size and structure. Venous: The inferior vena cava was not well visualized. IAS/Shunts: No atrial level shunt detected by color flow Doppler.  LEFT VENTRICLE PLAX 2D LVIDd:         4.70 cm   Diastology LVIDs:         3.90 cm   LV e' medial:    4.00 cm/s LV PW:         0.90 cm   LV E/e' medial:  10.6 LV IVS:        0.60 cm   LV e' lateral:   7.37 cm/s LVOT diam:     1.90 cm   LV E/e' lateral: 5.7 LV SV:         54 LV SV Index:   38 LVOT Area:     2.84 cm  RIGHT VENTRICLE RV S prime:     9.97 cm/s  TAPSE (M-mode): 1.2 cm LEFT ATRIUM             Index        RIGHT ATRIUM           Index LA diam:        3.10 cm 2.17 cm/m   RA Area:     12.90 cm LA Vol (A2C):   19.5 ml 13.67 ml/m  RA Volume:   30.80 ml  21.60 ml/m LA Vol (A4C):   21.7 ml 15.22 ml/m LA Biplane Vol: 21.1 ml 14.79 ml/m  AORTIC VALVE AV Area (Vmax):    2.42 cm AV Area (Vmean):   2.27 cm AV Area (VTI):     2.23 cm AV Vmax:           142.00 cm/s AV Vmean:          108.000 cm/s AV VTI:            0.240 m AV Peak Grad:      8.1 mmHg AV Mean Grad:  5.0 mmHg LVOT Vmax:         121.00 cm/s LVOT Vmean:        86.300 cm/s LVOT VTI:          0.189 m LVOT/AV VTI ratio: 0.79  AORTA Ao Root diam: 2.90 cm MITRAL VALVE MV Area (PHT): 2.18 cm    SHUNTS MV Decel Time: 349 msec    Systemic VTI:  0.19 m MV E velocity: 42.30 cm/s  Systemic Diam: 1.90 cm MV A velocity: 41.10 cm/s MV E/A ratio:  1.03 Skeet Latch MD Electronically signed by Skeet Latch MD Signature Date/Time: 12/06/2021/12:04:04 PM    Final (Updated)    CT Angio Chest Pulmonary Embolism (PE) W or WO Contrast  Result Date: 12/05/2021 CLINICAL DATA:  Suspected pulmonary embolism. EXAM: CT ANGIOGRAPHY CHEST WITH CONTRAST TECHNIQUE: Multidetector CT imaging of the chest was performed using the standard protocol during bolus administration of intravenous contrast. Multiplanar CT image reconstructions and MIPs were obtained to evaluate the vascular anatomy. RADIATION DOSE REDUCTION: This exam was performed according to the departmental dose-optimization program which includes automated exposure control, adjustment of the mA and/or kV according to patient size and/or use of iterative reconstruction technique. CONTRAST:  118m OMNIPAQUE IOHEXOL 350 MG/ML SOLN COMPARISON:  December 05, 2021 (11:44 a.m.) FINDINGS: Cardiovascular: A left-sided venous Port-A-Cath is in place. The thoracic aorta is normal in appearance, without evidence of aortic aneurysm. Satisfactory opacification of the  pulmonary arteries to the segmental level. No evidence of pulmonary embolism. Normal heart size with mild coronary artery calcification. No pericardial effusion. Mediastinum/Nodes: Endotracheal and nasogastric tubes are in place. No enlarged mediastinal, hilar, or axillary lymph nodes. Thyroid gland, trachea, and esophagus demonstrate no significant findings. Lungs/Pleura: Moderate severity lingular and bibasilar atelectasis and/or infiltrate is seen. This is mildly decreased in severity when compared to the prior study. Small multifocal areas of atelectasis and/or infiltrate are also seen within the left upper lobe. This is a new finding when compared to the prior exam. A very small right pleural effusion is seen. No pneumothorax is identified. Upper Abdomen: There is diffuse fatty infiltration of the liver parenchyma. Musculoskeletal: Acute anterior second, third, fourth, fifth, sixth and seventh right rib fractures are noted. Acute anterolateral second, third, fourth and fifth left rib fractures are also seen. A compression fracture deformity is seen at the level of T9. Review of the MIP images confirms the above findings. IMPRESSION: 1. No evidence for pulmonary embolism. 2. Moderate severity lingular and bibasilar atelectasis and/or infiltrate, mildly decreased in severity when compared to the prior study. 3. Small multifocal areas of left upper lobe atelectasis and/or infiltrate. 4. Very small right pleural effusion. 5. Multiple bilateral rib fractures, as described above. 6. Compression fracture deformity at the level of T9. Electronically Signed   By: TVirgina NorfolkM.D.   On: 12/05/2021 20:59   CT HEAD WO CONTRAST (5MM)  Result Date: 12/05/2021 CLINICAL DATA:  Altered mental status. EXAM: CT HEAD WITHOUT CONTRAST TECHNIQUE: Contiguous axial images were obtained from the base of the skull through the vertex without intravenous contrast. RADIATION DOSE REDUCTION: This exam was performed according to  the departmental dose-optimization program which includes automated exposure control, adjustment of the mA and/or kV according to patient size and/or use of iterative reconstruction technique. COMPARISON:  December 05, 2021 (11:41 a.m.) FINDINGS: Brain: No evidence of acute infarction, hemorrhage, hydrocephalus, extra-axial collection or mass lesion/mass effect. Vascular: No hyperdense vessel or unexpected calcification. Skull: Normal. Negative for fracture or focal lesion.  Sinuses/Orbits: No acute finding. Other: Endotracheal and nasogastric tubes are in place. IMPRESSION: No acute intracranial abnormality. Electronically Signed   By: Virgina Norfolk M.D.   On: 12/05/2021 20:46   DG Chest Port 1 View  Result Date: 12/05/2021 CLINICAL DATA:  Cardiac arrest. EXAM: PORTABLE CHEST 1 VIEW COMPARISON:  Same day chest radiograph at 1131 hours; same day CT angio chest at 1144 hours FINDINGS: Endotracheal tube tip projects 5.4 cm above the carina. Left IJ port central venous catheter tip projects at the level of the superior cavoatrial junction. Enteric tube tip is below the diaphragm but not included on the image. Additional tube projects at the level of the right lower neck, it is unclear if this is external to the patient. Probable small left and trace right pleural effusions. Patchy airspace opacities in the left lung base, corresponding to findings on same day CT chest. No pneumothorax. IMPRESSION: 1. Support lines and tubes are in stable position. 2. Probable small left and trace right pleural effusions. Patchy airspace opacities in the left lung base corresponding to parenchymal opacities seen on same day CT chest. Electronically Signed   By: Ileana Roup M.D.   On: 12/05/2021 17:57      Subjective: She is feeling better.  Overall diarrhea has improved.  Continues to have pain with deep breathing, but she is working with the incentive spirometer.  She is eager to discharge today.  Discharge Exam: Vitals:    12/20/21 0811 12/20/21 0823 12/20/21 0841 12/20/21 1120  BP: 107/70  105/70 105/63  Pulse:    90  Resp: '14 15 14 16  '$ Temp:      TempSrc:    Oral  SpO2:    97%  Weight:      Height:        General: Pt is alert, awake, not in acute distress Cardiovascular: RRR, S1/S2 +, no rubs, no gallops Respiratory: CTA bilaterally, no wheezing, no rhonchi Abdominal: Soft, NT, ND, bowel sounds + Extremities: Bilateral lower extremity amputations    The results of significant diagnostics from this hospitalization (including imaging, microbiology, ancillary and laboratory) are listed below for reference.     Microbiology: No results found for this or any previous visit (from the past 240 hour(s)).   Labs: BNP (last 3 results) No results for input(s): "BNP" in the last 8760 hours. Basic Metabolic Panel: Recent Labs  Lab 12/16/21 0827 12/17/21 0427 12/18/21 0449 12/19/21 0500 12/20/21 0716  NA 136 138 137 140 140  K 4.0 3.8 3.6 3.7 4.3  CL 107 109 108 112* 114*  CO2 22 22 21* 20* 21*  GLUCOSE 74 78 115* 84 80  BUN <5* 5* 6 <5* 7  CREATININE 0.59 0.51 0.64 0.65 0.57  CALCIUM 7.9* 8.2* 8.0* 7.6* 7.8*  MG 2.0 1.9 1.7 1.5* 2.1  PHOS 3.3 3.9 3.3 3.3 3.5   Liver Function Tests: Recent Labs  Lab 12/15/21 0440 12/16/21 0827 12/17/21 0427 12/18/21 0449 12/19/21 0500 12/20/21 0716  AST 189* 180* 163* 155* 172*  --   ALT 57* 59* 57* 54* 53*  --   ALKPHOS 428* 518* 500* 516* 503*  --   BILITOT 1.5* 1.2 1.5* 1.3* 1.0  --   PROT 5.1* 5.6* 5.6* 5.4* 5.1*  --   ALBUMIN 1.6* 1.8* 1.7* 1.6* <1.5* 2.0*   No results for input(s): "LIPASE", "AMYLASE" in the last 168 hours. No results for input(s): "AMMONIA" in the last 168 hours. CBC: Recent Labs  Lab 12/16/21 0827  12/17/21 0427 12/18/21 0449 12/18/21 1504 12/19/21 0500 12/19/21 1326 12/20/21 0717  WBC 10.9* 9.5 6.9 6.4 4.5 5.9 4.9  NEUTROABS 8.3* 7.3 5.7 5.6 4.0  --   --   HGB 10.8* 10.5* 10.4* 10.3* 7.1* 10.0* 9.4*  HCT  33.1* 31.7* 31.6* 30.2* 21.2* 29.4* 28.4*  MCV 100.9* 100.6* 101.9* 100.3* 101.4* 100.0 101.4*  PLT 110* 105* 105* 100* 77* 112* 97*   Cardiac Enzymes: No results for input(s): "CKTOTAL", "CKMB", "CKMBINDEX", "TROPONINI" in the last 168 hours. BNP: Invalid input(s): "POCBNP" CBG: Recent Labs  Lab 12/20/21 0014 12/20/21 0359 12/20/21 0820 12/20/21 1117 12/20/21 1719  GLUCAP 73 75 92 71 84   D-Dimer No results for input(s): "DDIMER" in the last 72 hours. Hgb A1c No results for input(s): "HGBA1C" in the last 72 hours. Lipid Profile No results for input(s): "CHOL", "HDL", "LDLCALC", "TRIG", "CHOLHDL", "LDLDIRECT" in the last 72 hours. Thyroid function studies No results for input(s): "TSH", "T4TOTAL", "T3FREE", "THYROIDAB" in the last 72 hours.  Invalid input(s): "FREET3" Anemia work up No results for input(s): "VITAMINB12", "FOLATE", "FERRITIN", "TIBC", "IRON", "RETICCTPCT" in the last 72 hours. Urinalysis    Component Value Date/Time   COLORURINE YELLOW 12/15/2021 1043   APPEARANCEUR CLEAR 12/15/2021 1043   LABSPEC 1.014 12/15/2021 1043   PHURINE 5.0 12/15/2021 1043   GLUCOSEU NEGATIVE 12/15/2021 1043   HGBUR NEGATIVE 12/15/2021 1043   BILIRUBINUR NEGATIVE 12/15/2021 1043   KETONESUR NEGATIVE 12/15/2021 1043   PROTEINUR NEGATIVE 12/15/2021 1043   NITRITE NEGATIVE 12/15/2021 1043   LEUKOCYTESUR TRACE (A) 12/15/2021 1043   Sepsis Labs Recent Labs  Lab 12/18/21 1504 12/19/21 0500 12/19/21 1326 12/20/21 0717  WBC 6.4 4.5 5.9 4.9   Microbiology No results found for this or any previous visit (from the past 240 hour(s)).   Time coordinating discharge: 41mns  SIGNED:   JKathie Dike MD  [facility] 12/20/2021, 6:49 PM   If 7PM-7AM, please contact night-coverage [email or web address]

## 2021-12-20 NOTE — TOC Progression Note (Signed)
Transition of Care [facility]) - Progression Note    Patient Details  Name: Mallory Ayers MRN: [id number] Date of Birth: [birth year]  Transition of Care [facility]) CM/SW Contact  Zenon Mayo, RN Phone Number: 12/20/2021, 11:25 AM  Clinical Narrative:    NCM faxed life vest order, demo, h/p and porgress note and echo results to [place].        Expected Discharge Plan and Services                                                 Social Determinants of Health (SDOH) Interventions    Readmission Risk Interventions     No data to display

## 2021-12-20 NOTE — Progress Notes (Addendum)
Rounding Note    Patient Name: Mallory Ayers Date of Encounter: 12/20/2021  [facility] Cardiologist: None   Subjective   AAO, denies any new complaints, hopes to go home soon  Inpatient Medications    Scheduled Meds:  acetaminophen  1,000 mg Oral Q6H   apixaban  5 mg Oral BID   aspirin  81 mg Oral Daily   calcium carbonate  200 mg of elemental calcium Oral TID   Chlorhexidine Gluconate Cloth  6 each Topical Daily   feeding supplement  237 mL Oral BID BM   insulin aspart  0-9 Units Subcutaneous Q4H   levothyroxine  100 mcg Oral Q0600   magnesium chloride  1 tablet Oral Daily   multivitamin with minerals  1 tablet Oral BID BM   pregabalin  225 mg Oral BID   sodium chloride flush  10-40 mL Intracatheter Q12H   thiamine  100 mg Oral Daily   Continuous Infusions:  albumin human     PRN Meds: guaiFENesin, HYDROmorphone, methocarbamol, mouth rinse, sodium chloride flush   Vital Signs    Vitals:   12/19/21 1800 12/19/21 2011 12/19/21 2143 12/20/21 0401  BP: 107/77 106/71 114/75 120/88  Pulse:  83  92  Resp: 20 (!) 21 19   Temp:  98.2 F (36.8 C)  98 F (36.7 C)  TempSrc:  Oral  Oral  SpO2:  100%  100%  Weight:    88.2 kg  Height:        Intake/Output Summary (Last 24 hours) at 12/20/2021 0856 Last data filed at 12/20/2021 0600 Gross per 24 hour  Intake 1208.49 ml  Output --  Net 1208.49 ml      12/20/2021    4:01 AM 12/19/2021    4:08 AM 12/18/2021    4:43 AM  Last 3 Weights  Weight (lbs) 194 lb 7.1 oz 184 lb 15.5 oz 185 lb 13.6 oz  Weight (kg) 88.2 kg 83.9 kg 84.3 kg      Telemetry    SR 70's-90's - Personally Reviewed  ECG    No new EKGs - Personally Reviewed  Physical Exam    Examined by Dr. Quentin Ore, remains unchanged GEN: No acute distress.   Neck: No JVD Cardiac: RRR, no murmurs, rubs, or gallops.  Respiratory: CTA b/l. GI: Soft, nontender, non-distended  MS: b/l AKA's (remotely) Neuro:  Nonfocal  Psych: Normal affect    Labs    High Sensitivity Troponin:   Recent Labs  Lab 12/05/21 2105 12/06/21 0445  TROPONINIHS 1,771* 1,789*     Chemistry Recent Labs  Lab 12/17/21 0427 12/18/21 0449 12/19/21 0500 12/20/21 0716  NA 138 137 140 140  K 3.8 3.6 3.7 4.3  CL 109 108 112* 114*  CO2 22 21* 20* 21*  GLUCOSE 78 115* 84 80  BUN 5* 6 <5* 7  CREATININE 0.51 0.64 0.65 0.57  CALCIUM 8.2* 8.0* 7.6* 7.8*  MG 1.9 1.7 1.5* 2.1  PROT 5.6* 5.4* 5.1*  --   ALBUMIN 1.7* 1.6* <1.5* 2.0*  AST 163* 155* 172*  --   ALT 57* 54* 53*  --   ALKPHOS 500* 516* 503*  --   BILITOT 1.5* 1.3* 1.0  --   GFRNONAA >60 >60 >60 >60  ANIONGAP '7 8 8 5    '$ Lipids  No results for input(s): "CHOL", "TRIG", "HDL", "LABVLDL", "LDLCALC", "CHOLHDL" in the last 168 hours.   Hematology Recent Labs  Lab 12/19/21 0500 12/19/21 1326 12/20/21 6378  WBC 4.5 5.9 4.9  RBC 2.09* 2.94* 2.80*  HGB 7.1* 10.0* 9.4*  HCT 21.2* 29.4* 28.4*  MCV 101.4* 100.0 101.4*  MCH 34.0 34.0 33.6  MCHC 33.5 34.0 33.1  RDW 22.6* 22.5* 22.6*  PLT 77* 112* 97*   Thyroid  No results for input(s): "TSH", "FREET4" in the last 168 hours.   BNPNo results for input(s): "BNP", "PROBNP" in the last 168 hours.  DDimer No results for input(s): "DDIMER" in the last 168 hours.   Radiology     Cardiac Studies   12/06/2021: TTE  1. Compared with the echo 07/2021, LVEF has increased slightly. There is  very mild hypokinesis of the basa anteroseptum and the basal to mid  inferoseptum which is unchanged from prior. No acute findings.   2. Left ventricular ejection fraction, by estimation, is 55 to 60%. The  left ventricle has normal function. The left ventricle demonstrates  regional wall motion abnormalities (see scoring diagram/findings for  description). Left ventricular diastolic  parameters are consistent with Grade I diastolic dysfunction (impaired  relaxation).   3. Right ventricular systolic function is normal. The right ventricular  size is  normal.   4. The mitral valve is normal in structure. Trivial mitral valve  regurgitation. No evidence of mitral stenosis.   5. The aortic valve is normal in structure. Aortic valve regurgitation is  not visualized. No aortic stenosis is present.   Comparison(s): No significant change from prior study.   07/10/21: TTE  1. GLS -9.4. Left ventricular ejection fraction, by estimation, is 55 to  60%. The left ventricle has normal function. The left ventricle has no  regional wall motion abnormalities. Left ventricular diastolic parameters  were normal.   2. Right ventricular systolic function is normal. The right ventricular  size is normal. There is normal pulmonary artery systolic pressure.   3. The mitral valve is normal in structure. No evidence of mitral valve  regurgitation. No evidence of mitral stenosis.   4. The aortic valve is normal in structure. Aortic valve regurgitation is  not visualized. No aortic stenosis is present.   5. The inferior vena cava is normal in size with greater than 50%  respiratory variability, suggesting right atrial pressure of 3 mmHg.   Patient Profile     51 y.o. female with a hx of traumatic b/l AKA, OSA, hx of provoked PE on Eliquis, chronic pain, breast cancer admitted with OOH arrest  Assessment & Plan    Long QT VF QT likely provoked by drugs, trazodone, zofran, compazine, among others and hypokalemia And perhaps noncompliance (over-taking) with meds per family S-ICD implant cancelled 2/2 persistent hypotension   K+ 4.3 Mag 2.1  No betablocker with Intermittent hypotension Keep potassium 4.0 or better and mag 2.0 or better Avoid all potential QT prolonging drugs  3. Abnormal Trops on admission Likely 2/2 arrest, CPR (unclear, but mention of one defibrillation by EMS) Coronary CT cancelled with scanner down on Monday Will  plan for ischemic eval out patient as well   4. Elevated TSH Felt to be sick euthyroid Getting synthroid  5.  New and persistent hypotension BP is better Repeat CBC was better C/e IM team   Dr. Quentin Ore has seen the patient this morning Discussed plan to defer ICD implant for now and plan for life vest Discussed importance of wearing it at all times. Life vest is ordered, spoke with rep yesterday, they are aware. EP office follow is in place, will plan stress testing and  ICD implant timing OK to discharged from EP perspective once she has life vest fitted/on   For questions or updates, please contact [place] Please consult [email or web address] for contact info under        Signed, Baldwin Jamaica, PA-C  12/20/2021, 8:56 AM

## 2021-12-23 ENCOUNTER — Emergency Department (HOSPITAL_COMMUNITY): Payer: Medicare HMO

## 2021-12-23 ENCOUNTER — Inpatient Hospital Stay (HOSPITAL_COMMUNITY)
Admission: EM | Admit: 2021-12-23 | Discharge: 2021-12-27 | DRG: 603 | Disposition: A | Discharge status: Home / Self Care | Payer: Medicare HMO | Attending: Internal Medicine | Admitting: Internal Medicine

## 2021-12-23 ENCOUNTER — Other Ambulatory Visit: Payer: Self-pay

## 2021-12-23 ENCOUNTER — Encounter (HOSPITAL_COMMUNITY): Payer: Self-pay | Admitting: Emergency Medicine

## 2021-12-23 DIAGNOSIS — Z170 Estrogen receptor positive status [ER+]: Secondary | ICD-10-CM | POA: Diagnosis not present

## 2021-12-23 DIAGNOSIS — D638 Anemia in other chronic diseases classified elsewhere: Secondary | ICD-10-CM | POA: Diagnosis present

## 2021-12-23 DIAGNOSIS — G8929 Other chronic pain: Secondary | ICD-10-CM | POA: Diagnosis present

## 2021-12-23 DIAGNOSIS — A0472 Enterocolitis due to Clostridium difficile, not specified as recurrent: Secondary | ICD-10-CM | POA: Diagnosis not present

## 2021-12-23 DIAGNOSIS — E8809 Other disorders of plasma-protein metabolism, not elsewhere classified: Secondary | ICD-10-CM | POA: Diagnosis present

## 2021-12-23 DIAGNOSIS — F32A Depression, unspecified: Secondary | ICD-10-CM | POA: Diagnosis present

## 2021-12-23 DIAGNOSIS — L03114 Cellulitis of left upper limb: Secondary | ICD-10-CM | POA: Diagnosis not present

## 2021-12-23 DIAGNOSIS — Z8249 Family history of ischemic heart disease and other diseases of the circulatory system: Secondary | ICD-10-CM

## 2021-12-23 DIAGNOSIS — I469 Cardiac arrest, cause unspecified: Secondary | ICD-10-CM | POA: Diagnosis not present

## 2021-12-23 DIAGNOSIS — Z9884 Bariatric surgery status: Secondary | ICD-10-CM | POA: Diagnosis not present

## 2021-12-23 DIAGNOSIS — Z89612 Acquired absence of left leg above knee: Secondary | ICD-10-CM | POA: Diagnosis not present

## 2021-12-23 DIAGNOSIS — L0390 Cellulitis, unspecified: Secondary | ICD-10-CM | POA: Diagnosis present

## 2021-12-23 DIAGNOSIS — Z87891 Personal history of nicotine dependence: Secondary | ICD-10-CM

## 2021-12-23 DIAGNOSIS — T451X5A Adverse effect of antineoplastic and immunosuppressive drugs, initial encounter: Secondary | ICD-10-CM | POA: Diagnosis present

## 2021-12-23 DIAGNOSIS — Z89611 Acquired absence of right leg above knee: Secondary | ICD-10-CM

## 2021-12-23 DIAGNOSIS — Z853 Personal history of malignant neoplasm of breast: Secondary | ICD-10-CM

## 2021-12-23 DIAGNOSIS — Z803 Family history of malignant neoplasm of breast: Secondary | ICD-10-CM

## 2021-12-23 DIAGNOSIS — C50411 Malignant neoplasm of upper-outer quadrant of right female breast: Secondary | ICD-10-CM

## 2021-12-23 DIAGNOSIS — K219 Gastro-esophageal reflux disease without esophagitis: Secondary | ICD-10-CM | POA: Diagnosis present

## 2021-12-23 DIAGNOSIS — Z7982 Long term (current) use of aspirin: Secondary | ICD-10-CM

## 2021-12-23 DIAGNOSIS — E0781 Sick-euthyroid syndrome: Secondary | ICD-10-CM | POA: Diagnosis present

## 2021-12-23 DIAGNOSIS — Z86718 Personal history of other venous thrombosis and embolism: Secondary | ICD-10-CM | POA: Diagnosis not present

## 2021-12-23 DIAGNOSIS — Z8674 Personal history of sudden cardiac arrest: Secondary | ICD-10-CM | POA: Diagnosis present

## 2021-12-23 DIAGNOSIS — D696 Thrombocytopenia, unspecified: Secondary | ICD-10-CM | POA: Diagnosis present

## 2021-12-23 DIAGNOSIS — E876 Hypokalemia: Secondary | ICD-10-CM | POA: Diagnosis present

## 2021-12-23 DIAGNOSIS — E039 Hypothyroidism, unspecified: Secondary | ICD-10-CM | POA: Diagnosis present

## 2021-12-23 DIAGNOSIS — K760 Fatty (change of) liver, not elsewhere classified: Secondary | ICD-10-CM | POA: Diagnosis present

## 2021-12-23 DIAGNOSIS — Z79890 Hormone replacement therapy (postmenopausal): Secondary | ICD-10-CM | POA: Diagnosis not present

## 2021-12-23 DIAGNOSIS — F419 Anxiety disorder, unspecified: Secondary | ICD-10-CM | POA: Diagnosis present

## 2021-12-23 DIAGNOSIS — R7401 Elevation of levels of liver transaminase levels: Secondary | ICD-10-CM | POA: Diagnosis present

## 2021-12-23 DIAGNOSIS — I4581 Long QT syndrome: Secondary | ICD-10-CM | POA: Diagnosis not present

## 2021-12-23 DIAGNOSIS — I10 Essential (primary) hypertension: Secondary | ICD-10-CM | POA: Diagnosis present

## 2021-12-23 DIAGNOSIS — Z95810 Presence of automatic (implantable) cardiac defibrillator: Secondary | ICD-10-CM | POA: Diagnosis not present

## 2021-12-23 DIAGNOSIS — Z7901 Long term (current) use of anticoagulants: Secondary | ICD-10-CM

## 2021-12-23 DIAGNOSIS — G4733 Obstructive sleep apnea (adult) (pediatric): Secondary | ICD-10-CM | POA: Diagnosis present

## 2021-12-23 DIAGNOSIS — Z7951 Long term (current) use of inhaled steroids: Secondary | ICD-10-CM

## 2021-12-23 DIAGNOSIS — Z86711 Personal history of pulmonary embolism: Secondary | ICD-10-CM

## 2021-12-23 LAB — COMPREHENSIVE METABOLIC PANEL
ALT: 51 U/L — ABNORMAL HIGH (ref 0–44)
AST: 186 U/L — ABNORMAL HIGH (ref 15–41)
Albumin: 2 g/dL — ABNORMAL LOW (ref 3.5–5.0)
Alkaline Phosphatase: 501 U/L — ABNORMAL HIGH (ref 38–126)
Anion gap: 10 (ref 5–15)
BUN: <5 mg/dL — ABNORMAL LOW (ref 6–20)
CO2: 23 mmol/L (ref 22–32)
Calcium: 7.8 mg/dL — ABNORMAL LOW (ref 8.9–10.3)
Chloride: 107 mmol/L (ref 98–111)
Creatinine, Ser: 0.5 mg/dL (ref 0.44–1.00)
GFR, Estimated: >60 mL/min (ref >60)
Glucose, Bld: 76 mg/dL (ref 70–99)
Potassium: 3.1 mmol/L — ABNORMAL LOW (ref 3.5–5.1)
Sodium: 140 mmol/L (ref 135–145)
Total Bilirubin: 1.7 mg/dL — ABNORMAL HIGH (ref 0.3–1.2)
Total Protein: 5.5 g/dL — ABNORMAL LOW (ref 6.5–8.1)

## 2021-12-23 LAB — CBC WITH DIFFERENTIAL/PLATELET
Abs Immature Granulocytes: 0 10*3/uL (ref 0.00–0.07)
Basophils Absolute: 0 10*3/uL (ref 0.0–0.1)
Basophils Relative: 0 %
Eosinophils Absolute: 0 10*3/uL (ref 0.0–0.5)
Eosinophils Relative: 0 %
HCT: 29 % — ABNORMAL LOW (ref 36.0–46.0)
Hemoglobin: 10 g/dL — ABNORMAL LOW (ref 12.0–15.0)
Lymphocytes Relative: 23 %
Lymphs Abs: 3.7 10*3/uL (ref 0.7–4.0)
MCH: 34.1 pg — ABNORMAL HIGH (ref 26.0–34.0)
MCHC: 34.5 g/dL (ref 30.0–36.0)
MCV: 99 fL (ref 80.0–100.0)
Monocytes Absolute: 0.8 10*3/uL (ref 0.1–1.0)
Monocytes Relative: 5 %
Neutro Abs: 11.7 10*3/uL — ABNORMAL HIGH (ref 1.7–7.7)
Neutrophils Relative %: 72 %
Platelets: 41 10*3/uL — ABNORMAL LOW (ref 150–400)
RBC: 2.93 MIL/uL — ABNORMAL LOW (ref 3.87–5.11)
RDW: 20.5 % — ABNORMAL HIGH (ref 11.5–15.5)
WBC: 16.2 10*3/uL — ABNORMAL HIGH (ref 4.0–10.5)
nRBC: 0 % (ref 0.0–0.2)

## 2021-12-23 LAB — LACTIC ACID, PLASMA
Lactic Acid, Venous: 1.3 mmol/L (ref 0.5–1.9)
Lactic Acid, Venous: 1.1 mmol/L (ref 0.5–1.9)

## 2021-12-23 MED ORDER — CHLORHEXIDINE GLUCONATE CLOTH 2 % EX PADS
6.0000 | MEDICATED_PAD | Freq: Every day | CUTANEOUS | Status: DC
  Administered 2021-12-24 – 2021-12-26 (×3): 6 via TOPICAL

## 2021-12-23 MED ORDER — SODIUM CHLORIDE 0.9% FLUSH: 10.0000 mL | INTRAVENOUS | Status: DC | PRN

## 2021-12-23 MED ORDER — CEFAZOLIN SODIUM-DEXTROSE 1-4 GM/50ML-% IV SOLN
1.0000 g | Freq: Three times a day (TID) | INTRAVENOUS | Status: DC
  Administered 2021-12-23 – 2021-12-27 (×11): 1 g via INTRAVENOUS
  Filled 2021-12-23 (×13): qty 50

## 2021-12-23 MED ORDER — SODIUM CHLORIDE 0.9 % IV BOLUS
1000.0000 mL | Freq: Once | INTRAVENOUS | Status: AC
  Administered 2021-12-23: 1000 mL via INTRAVENOUS

## 2021-12-23 MED ORDER — HYDROMORPHONE HCL 1 MG/ML IJ SOLN
1.0000 mg | Freq: Once | INTRAMUSCULAR | Status: AC
  Administered 2021-12-23: 1 mg via INTRAVENOUS
  Filled 2021-12-23: qty 1

## 2021-12-23 NOTE — ED Triage Notes (Signed)
Patient here with complaint of left wrist and hand pain, redness, and swelling  that started today. Patient is afebrile, alert, oriented, and in no apparent distress at this time.
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
## 2021-12-23 NOTE — ED Provider Notes (Signed)
[facility] EMERGENCY DEPARTMENT Provider Note   CSN: [id number] Arrival date & time: 12/23/21  1725     History  Chief Complaint  Patient presents with   Wrist Pain    GRABIELA WOHLFORD is a 51 y.o. female history of breast cancer, recent V-fib arrest with a LifeVest, who presented with cellulitis of the left arm.  Patient was just admitted to the hospital and discharged 4 days ago after a V-fib arrest and has a LifeVest.  Patient also had C. difficile and just finished a course of fidaxomycin.  Patient states that since yesterday she notes some swelling and redness of the left hand.  She states that today progressed to the entire forearm.  She states that it feels hot to touch.  Patient has subjective chills but no actual fevers.  She states that she no longer has diarrhea.  The history is provided by the patient.       Home Medications Prior to Admission medications   Medication Sig Start Date End Date Taking? Authorizing Provider  apixaban (ELIQUIS) 5 MG TABS tablet Take 5 mg by mouth 2 (two) times daily.    [provider]  aspirin 81 MG chewable tablet Chew 1 tablet (81 mg total) by mouth daily. 12/21/21   Kathie Dike, MD  BELSOMRA 20 MG TABS Take 1 tablet by mouth at bedtime as needed (for sleep). 09/19/21   [provider]  budesonide-formoterol (SYMBICORT) 160-4.5 MCG/ACT inhaler Inhale 2 puffs into the lungs daily as needed (For shortess of breath).    [provider]  busPIRone (BUSPAR) 10 MG tablet Take 15 mg by mouth 2 (two) times daily. 11/06/21   [provider]  cyclobenzaprine (FLEXERIL) 10 MG tablet Take 10 mg by mouth 2 (two) times daily as needed for muscle spasms. 10/08/21   [provider]  DODEX 1000 MCG/ML injection Inject 100 mcg into the muscle every 30 (thirty) days. 04/27/21   [provider]  esomeprazole (NEXIUM) 40 MG capsule Take 40 mg by mouth daily as needed (Heartburn). 10/23/21    [provider]  FLUoxetine HCl 60 MG TABS Take 60 mg by mouth daily. 08/02/21   [name], MD  HYDROmorphone (DILAUDID) 4 MG tablet Take 1 tablet (4 mg total) by mouth every 6 (six) hours as needed for up to 12 days for moderate pain. 12/20/21 01/01/22  Kathie Dike, MD  levothyroxine (SYNTHROID) 100 MCG tablet Take 100 mcg by mouth daily before breakfast.    [provider]  loratadine (CLARITIN) 10 MG tablet Take 10 mg by mouth daily as needed for allergies. 07/25/21   [provider]  LORazepam (ATIVAN) 1 MG tablet Take 1 mg by mouth every 8 (eight) hours as needed for anxiety.    [provider]  Multiple Vitamins-Iron (MULTIVITAMINS WITH IRON) TABS tablet Take 1 tablet by mouth every morning.     [provider]  norethindrone (MICRONOR) 0.35 MG tablet Take 1 tablet by mouth daily.    [provider]  ondansetron (ZOFRAN-ODT) 4 MG disintegrating tablet Take 4 mg by mouth every 8 (eight) hours as needed for nausea or vomiting.    [provider]  oxyCODONE (ROXICODONE) 15 MG immediate release tablet Take 15 mg by mouth every 6 (six) hours as needed for pain. 11/22/21   [provider]  potassium chloride SA (KLOR-CON M) 20 MEQ tablet Take 20 mEq by mouth 2 (two) times daily. 03/29/21   [provider]  pregabalin (LYRICA) 225 MG capsule Take 225 mg by mouth 2 (two) times daily. 11/22/21   [provider]      Allergies    Patient has no known allergies.    Review of Systems   Review of Systems  Skin:  Positive for color change.  All other systems reviewed and are negative.   Physical Exam Updated Vital Signs BP 127/77 (BP Location: Left Leg)   Pulse 84   Temp 98.3 F (36.8 C) (Oral)   Resp (!) 22   SpO2 94%  Physical Exam Vitals and nursing note reviewed.  Constitutional:      Comments: Chronically ill  HENT:     Head: Normocephalic.     Nose: Nose normal.     Mouth/Throat:      Mouth: Mucous membranes are dry.  Eyes:     Extraocular Movements: Extraocular movements intact.     Pupils: Pupils are equal, round, and reactive to light.  Cardiovascular:     Rate and Rhythm: Normal rate and regular rhythm.     Pulses: Normal pulses.     Heart sounds: Normal heart sounds.  Pulmonary:     Effort: Pulmonary effort is normal.     Breath sounds: Normal breath sounds.     Comments: LifeVest present Abdominal:     General: Abdomen is flat.     Palpations: Abdomen is soft.  Musculoskeletal:     Cervical back: Normal range of motion and neck supple.     Comments: Bilateral AKA  Skin:    Comments: Patient has cellulitis of the entire forearm and hand.  Patient has difficulty moving the wrist due to pain.  However the wrist joint does not appear to be swollen.  Patient has no obvious bite marks or injection sites that are swollen  Neurological:     General: No focal deficit present.     Mental Status: She is alert.  Psychiatric:        Mood and Affect: Mood normal.        Behavior: Behavior normal.     ED Results / Procedures / Treatments   Labs (all labs ordered are listed, but only abnormal results are displayed) Labs Reviewed  COMPREHENSIVE METABOLIC PANEL - Abnormal; Notable for the following components:      Result Value   Potassium 3.1 (*)    BUN <5 (*)    Calcium 7.8 (*)    Total Protein 5.5 (*)    Albumin 2.0 (*)    AST 186 (*)    ALT 51 (*)    Alkaline Phosphatase 501 (*)    Total Bilirubin 1.7 (*)    All other components within normal limits  CBC WITH DIFFERENTIAL/PLATELET - Abnormal; Notable for the following components:   WBC 16.2 (*)    RBC 2.93 (*)    Hemoglobin 10.0 (*)    HCT 29.0 (*)    MCH 34.1 (*)    RDW 20.5 (*)    Platelets 41 (*)    Neutro Abs 11.7 (*)    All other components within normal limits  CULTURE, BLOOD (ROUTINE X 2)  CULTURE, BLOOD (ROUTINE X 2)  LACTIC ACID, PLASMA  LACTIC ACID, PLASMA     EKG None  Radiology DG Hand Complete Left  Result Date: 12/23/2021 CLINICAL DATA:  Left arm cellulitis. EXAM: LEFT FOREARM - 2 VIEW; LEFT HAND - COMPLETE 3+ VIEW COMPARISON:  None Available. FINDINGS: No acute fracture or dislocation. Degenerative changes  are present at the wrist and first carpometacarpal joint. No periosteal elevation or bony erosion. Soft tissue swelling is present at the hand and forearm. No joint effusion at the elbow. IMPRESSION: No acute osseous abnormality or radiographic evidence of osteomyelitis. Electronically Signed   By: Brett Fairy M.D.   On: 12/23/2021 20:30   DG Forearm Left  Result Date: 12/23/2021 CLINICAL DATA:  Left arm cellulitis. EXAM: LEFT FOREARM - 2 VIEW; LEFT HAND - COMPLETE 3+ VIEW COMPARISON:  None Available. FINDINGS: No acute fracture or dislocation. Degenerative changes are present at the wrist and first carpometacarpal joint. No periosteal elevation or bony erosion. Soft tissue swelling is present at the hand and forearm. No joint effusion at the elbow. IMPRESSION: No acute osseous abnormality or radiographic evidence of osteomyelitis. Electronically Signed   By: Brett Fairy M.D.   On: 12/23/2021 20:30    Procedures Procedures    Medications Ordered in ED Medications  ceFAZolin (ANCEF) IVPB 1 g/50 mL premix (1 g Intravenous New Bag/Given 12/23/21 2138)  sodium chloride flush (NS) 0.9 % injection 10-40 mL (has no administration in time range)  Chlorhexidine Gluconate Cloth 2 % PADS 6 each (has no administration in time range)  HYDROmorphone (DILAUDID) injection 1 mg (1 mg Intravenous Given 12/23/21 2127)  sodium chloride 0.9 % bolus 1,000 mL (1,000 mLs Intravenous New Bag/Given 12/23/21 2128)    ED Course/ Medical Decision Making/ A&P                           Medical Decision Making TAKESHA STEGER is a 51 y.o. female here presenting with left hand and forearm cellulitis.  This is rapidly progressive since yesterday.  We will get x-rays  to rule out subcutaneous air.  I think likely just strep cellulitis.  She recently had C. difficile so I discussed with pharmacy about best option.  We will try Ancef for now.  Will get labs including CBC and CMP and lactate and cultures and x-rays.  We will give pain medicine and ancef.   9:07 PM White blood cell count is 16.  Lactate is normal x-rays did not show any osteomyelitis or air.  At this point, patient needs to be admitted for possible sepsis and cellulitis  Problems Addressed: Left arm cellulitis: acute illness or injury  Amount and/or Complexity of Data Reviewed Labs: ordered. Decision-making details documented in ED Course. Radiology: ordered and independent interpretation performed. Decision-making details documented in ED Course.  Risk OTC drugs. Prescription drug management. Decision regarding hospitalization.    Final Clinical Impression(s) / ED Diagnoses Final diagnoses:  Left arm cellulitis    Rx / DC Orders ED Discharge Orders     None         Drenda Freeze, MD 12/23/21 2156

## 2021-12-24 DIAGNOSIS — Z170 Estrogen receptor positive status [ER+]: Secondary | ICD-10-CM

## 2021-12-24 DIAGNOSIS — I469 Cardiac arrest, cause unspecified: Secondary | ICD-10-CM | POA: Diagnosis not present

## 2021-12-24 DIAGNOSIS — C50411 Malignant neoplasm of upper-outer quadrant of right female breast: Secondary | ICD-10-CM

## 2021-12-24 DIAGNOSIS — A0472 Enterocolitis due to Clostridium difficile, not specified as recurrent: Secondary | ICD-10-CM

## 2021-12-24 DIAGNOSIS — E8809 Other disorders of plasma-protein metabolism, not elsewhere classified: Secondary | ICD-10-CM

## 2021-12-24 DIAGNOSIS — R7401 Elevation of levels of liver transaminase levels: Secondary | ICD-10-CM | POA: Diagnosis not present

## 2021-12-24 DIAGNOSIS — L03114 Cellulitis of left upper limb: Secondary | ICD-10-CM | POA: Diagnosis not present

## 2021-12-24 DIAGNOSIS — Z9884 Bariatric surgery status: Secondary | ICD-10-CM

## 2021-12-24 DIAGNOSIS — E039 Hypothyroidism, unspecified: Secondary | ICD-10-CM

## 2021-12-24 DIAGNOSIS — I4581 Long QT syndrome: Secondary | ICD-10-CM

## 2021-12-24 DIAGNOSIS — E876 Hypokalemia: Secondary | ICD-10-CM

## 2021-12-24 LAB — MAGNESIUM: Magnesium: 1.4 mg/dL — ABNORMAL LOW (ref 1.7–2.4)

## 2021-12-24 LAB — PHOSPHORUS: Phosphorus: 3.1 mg/dL (ref 2.5–4.6)

## 2021-12-24 LAB — COMPREHENSIVE METABOLIC PANEL
ALT: 48 U/L — ABNORMAL HIGH (ref 0–44)
AST: 159 U/L — ABNORMAL HIGH (ref 15–41)
Albumin: 1.8 g/dL — ABNORMAL LOW (ref 3.5–5.0)
Alkaline Phosphatase: 460 U/L — ABNORMAL HIGH (ref 38–126)
Anion gap: 8 (ref 5–15)
BUN: <5 mg/dL — ABNORMAL LOW (ref 6–20)
CO2: 24 mmol/L (ref 22–32)
Calcium: 7.3 mg/dL — ABNORMAL LOW (ref 8.9–10.3)
Chloride: 107 mmol/L (ref 98–111)
Creatinine, Ser: 0.57 mg/dL (ref 0.44–1.00)
GFR, Estimated: >60 mL/min (ref >60)
Glucose, Bld: 73 mg/dL (ref 70–99)
Potassium: 2.7 mmol/L — CL (ref 3.5–5.1)
Sodium: 139 mmol/L (ref 135–145)
Total Bilirubin: 1.2 mg/dL (ref 0.3–1.2)
Total Protein: 4.7 g/dL — ABNORMAL LOW (ref 6.5–8.1)

## 2021-12-24 LAB — CBC
HCT: 27.2 % — ABNORMAL LOW (ref 36.0–46.0)
Hemoglobin: 9 g/dL — ABNORMAL LOW (ref 12.0–15.0)
MCH: 33.8 pg (ref 26.0–34.0)
MCHC: 33.1 g/dL (ref 30.0–36.0)
MCV: 102.3 fL — ABNORMAL HIGH (ref 80.0–100.0)
Platelets: 40 10*3/uL — ABNORMAL LOW (ref 150–400)
RBC: 2.66 MIL/uL — ABNORMAL LOW (ref 3.87–5.11)
RDW: 20.8 % — ABNORMAL HIGH (ref 11.5–15.5)
WBC: 22.1 10*3/uL — ABNORMAL HIGH (ref 4.0–10.5)
nRBC: 0.4 % — ABNORMAL HIGH (ref 0.0–0.2)

## 2021-12-24 LAB — C-REACTIVE PROTEIN: CRP: 10.8 mg/dL — ABNORMAL HIGH (ref <1.0)

## 2021-12-24 LAB — MRSA NEXT GEN BY PCR, NASAL: MRSA by PCR Next Gen: NOT DETECTED

## 2021-12-24 LAB — TSH: TSH: 45.918 u[IU]/mL — ABNORMAL HIGH (ref 0.350–4.500)

## 2021-12-24 LAB — PROTIME-INR
INR: 2.1 — ABNORMAL HIGH (ref 0.8–1.2)
Prothrombin Time: 23.4 seconds — ABNORMAL HIGH (ref 11.4–15.2)

## 2021-12-24 LAB — PROCALCITONIN: Procalcitonin: 2.26 ng/mL

## 2021-12-24 MED ORDER — FLUOXETINE HCL 60 MG PO TABS: 60.0000 mg | ORAL_TABLET | Freq: Every day | ORAL | Status: DC

## 2021-12-24 MED ORDER — POTASSIUM CHLORIDE 10 MEQ/100ML IV SOLN
10.0000 meq | INTRAVENOUS | Status: AC
  Administered 2021-12-24 (×4): 10 meq via INTRAVENOUS
  Filled 2021-12-24 (×4): qty 100

## 2021-12-24 MED ORDER — LORATADINE 10 MG PO TABS: 10.0000 mg | ORAL_TABLET | Freq: Every day | ORAL | Status: DC | PRN

## 2021-12-24 MED ORDER — MOMETASONE FURO-FORMOTEROL FUM 200-5 MCG/ACT IN AERO
2.0000 | INHALATION_SPRAY | Freq: Two times a day (BID) | RESPIRATORY_TRACT | Status: DC
  Administered 2021-12-24 – 2021-12-27 (×5): 2 via RESPIRATORY_TRACT
  Filled 2021-12-24 (×2): qty 8.8

## 2021-12-24 MED ORDER — PANTOPRAZOLE SODIUM 40 MG PO TBEC
40.0000 mg | DELAYED_RELEASE_TABLET | Freq: Every day | ORAL | Status: DC
  Administered 2021-12-24 – 2021-12-27 (×4): 40 mg via ORAL
  Filled 2021-12-24 (×4): qty 1

## 2021-12-24 MED ORDER — TAB-A-VITE/IRON PO TABS
1.0000 | ORAL_TABLET | Freq: Every morning | ORAL | Status: DC
  Administered 2021-12-25 – 2021-12-27 (×3): 1 via ORAL
  Filled 2021-12-24 (×3): qty 1

## 2021-12-24 MED ORDER — POTASSIUM CHLORIDE CRYS ER 10 MEQ PO TBCR
40.0000 meq | EXTENDED_RELEASE_TABLET | ORAL | Status: AC
  Administered 2021-12-24 (×2): 40 meq via ORAL
  Filled 2021-12-24: qty 4

## 2021-12-24 MED ORDER — PREGABALIN 75 MG PO CAPS
225.0000 mg | ORAL_CAPSULE | Freq: Two times a day (BID) | ORAL | Status: DC
  Administered 2021-12-24 – 2021-12-27 (×7): 225 mg via ORAL
  Filled 2021-12-24 (×7): qty 3

## 2021-12-24 MED ORDER — BUSPIRONE HCL 5 MG PO TABS
15.0000 mg | ORAL_TABLET | Freq: Two times a day (BID) | ORAL | Status: DC
  Administered 2021-12-24 – 2021-12-27 (×8): 15 mg via ORAL
  Filled 2021-12-24 (×2): qty 3
  Filled 2021-12-24: qty 2
  Filled 2021-12-24: qty 3
  Filled 2021-12-24: qty 2
  Filled 2021-12-24 (×3): qty 3

## 2021-12-24 MED ORDER — APIXABAN 5 MG PO TABS
5.0000 mg | ORAL_TABLET | Freq: Two times a day (BID) | ORAL | Status: DC
  Administered 2021-12-24 (×2): 5 mg via ORAL
  Filled 2021-12-24 (×2): qty 1

## 2021-12-24 MED ORDER — SUVOREXANT 20 MG PO TABS: 1.0000 | ORAL_TABLET | Freq: Every evening | ORAL | Status: DC | PRN

## 2021-12-24 MED ORDER — POTASSIUM CHLORIDE 10 MEQ/100ML IV SOLN: 10.0000 meq | INTRAVENOUS | Status: AC

## 2021-12-24 MED ORDER — POTASSIUM CHLORIDE CRYS ER 10 MEQ PO TBCR
20.0000 meq | EXTENDED_RELEASE_TABLET | Freq: Two times a day (BID) | ORAL | Status: DC
  Administered 2021-12-25 (×2): 20 meq via ORAL
  Filled 2021-12-24 (×3): qty 2

## 2021-12-24 MED ORDER — POTASSIUM CHLORIDE CRYS ER 20 MEQ PO TBCR: 40.0000 meq | EXTENDED_RELEASE_TABLET | ORAL | Status: DC

## 2021-12-24 MED ORDER — HYDROMORPHONE HCL 2 MG PO TABS
4.0000 mg | ORAL_TABLET | Freq: Four times a day (QID) | ORAL | Status: DC | PRN
  Administered 2021-12-24 – 2021-12-26 (×6): 4 mg via ORAL
  Filled 2021-12-24 (×7): qty 2

## 2021-12-24 MED ORDER — MELATONIN 5 MG PO TABS: 5.0000 mg | ORAL_TABLET | Freq: Every evening | ORAL | Status: DC | PRN

## 2021-12-24 MED ORDER — POTASSIUM CHLORIDE 20 MEQ PO PACK
40.0000 meq | PACK | Freq: Once | ORAL | Status: DC
  Filled 2021-12-24: qty 2

## 2021-12-24 MED ORDER — OXYCODONE HCL 5 MG PO TABS
15.0000 mg | ORAL_TABLET | Freq: Four times a day (QID) | ORAL | Status: DC | PRN
  Administered 2021-12-24 – 2021-12-27 (×7): 15 mg via ORAL
  Filled 2021-12-24 (×7): qty 3

## 2021-12-24 MED ORDER — POTASSIUM CHLORIDE CRYS ER 20 MEQ PO TBCR
20.0000 meq | EXTENDED_RELEASE_TABLET | Freq: Two times a day (BID) | ORAL | Status: DC
  Administered 2021-12-24: 20 meq via ORAL
  Filled 2021-12-24: qty 1

## 2021-12-24 MED ORDER — LORAZEPAM 1 MG PO TABS: 1.0000 mg | ORAL_TABLET | Freq: Three times a day (TID) | ORAL | Status: DC | PRN

## 2021-12-24 MED ORDER — MAGNESIUM SULFATE 4 GM/100ML IV SOLN
4.0000 g | Freq: Once | INTRAVENOUS | Status: AC
  Administered 2021-12-24: 4 g via INTRAVENOUS
  Filled 2021-12-24: qty 100

## 2021-12-24 MED ORDER — POTASSIUM CHLORIDE CRYS ER 20 MEQ PO TBCR
40.0000 meq | EXTENDED_RELEASE_TABLET | Freq: Once | ORAL | Status: AC
  Administered 2021-12-24: 40 meq via ORAL
  Filled 2021-12-24: qty 2

## 2021-12-24 MED ORDER — LEVOTHYROXINE SODIUM 100 MCG PO TABS
100.0000 ug | ORAL_TABLET | Freq: Every day | ORAL | Status: DC
  Administered 2021-12-24 – 2021-12-27 (×4): 100 ug via ORAL
  Filled 2021-12-24 (×4): qty 1
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
## 2021-12-24 NOTE — ED Notes (Signed)
Pt requested blood draw from port. Stated they are a hard stick and last blood draw bruised badly bc of cellulitis issues.
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
## 2021-12-24 NOTE — ED Notes (Signed)
Dr. Hal Hope, night coverage MD contacted to clarify potassium orders for patient. Per MD, administer tablet per home medication regimen and IV potassium may be discontinued at this time.

## 2021-12-25 DIAGNOSIS — L03114 Cellulitis of left upper limb: Secondary | ICD-10-CM | POA: Diagnosis not present

## 2021-12-25 DIAGNOSIS — A0472 Enterocolitis due to Clostridium difficile, not specified as recurrent: Secondary | ICD-10-CM | POA: Diagnosis not present

## 2021-12-25 DIAGNOSIS — I469 Cardiac arrest, cause unspecified: Secondary | ICD-10-CM | POA: Diagnosis not present

## 2021-12-25 DIAGNOSIS — R7401 Elevation of levels of liver transaminase levels: Secondary | ICD-10-CM | POA: Diagnosis not present

## 2021-12-25 LAB — CBC WITH DIFFERENTIAL/PLATELET
Abs Immature Granulocytes: 0 10*3/uL (ref 0.00–0.07)
Basophils Absolute: 0.1 10*3/uL (ref 0.0–0.1)
Basophils Relative: 1 %
Eosinophils Absolute: 0.7 10*3/uL — ABNORMAL HIGH (ref 0.0–0.5)
Eosinophils Relative: 5 %
HCT: 28.5 % — ABNORMAL LOW (ref 36.0–46.0)
Hemoglobin: 9.3 g/dL — ABNORMAL LOW (ref 12.0–15.0)
Lymphocytes Relative: 12 %
Lymphs Abs: 1.6 10*3/uL (ref 0.7–4.0)
MCH: 33.7 pg (ref 26.0–34.0)
MCHC: 32.6 g/dL (ref 30.0–36.0)
MCV: 103.3 fL — ABNORMAL HIGH (ref 80.0–100.0)
Monocytes Absolute: 0.8 10*3/uL (ref 0.1–1.0)
Monocytes Relative: 6 %
Neutro Abs: 10.3 10*3/uL — ABNORMAL HIGH (ref 1.7–7.7)
Neutrophils Relative %: 76 %
Platelets: 42 10*3/uL — ABNORMAL LOW (ref 150–400)
RBC: 2.76 MIL/uL — ABNORMAL LOW (ref 3.87–5.11)
RDW: 20.5 % — ABNORMAL HIGH (ref 11.5–15.5)
WBC: 13.5 10*3/uL — ABNORMAL HIGH (ref 4.0–10.5)
nRBC: 0 /100 WBC
nRBC: 0.1 % (ref 0.0–0.2)

## 2021-12-25 LAB — COMPREHENSIVE METABOLIC PANEL
ALT: 40 U/L (ref 0–44)
AST: 135 U/L — ABNORMAL HIGH (ref 15–41)
Albumin: 1.9 g/dL — ABNORMAL LOW (ref 3.5–5.0)
Alkaline Phosphatase: 436 U/L — ABNORMAL HIGH (ref 38–126)
Anion gap: 4 — ABNORMAL LOW (ref 5–15)
BUN: <5 mg/dL — ABNORMAL LOW (ref 6–20)
CO2: 26 mmol/L (ref 22–32)
Calcium: 7.6 mg/dL — ABNORMAL LOW (ref 8.9–10.3)
Chloride: 106 mmol/L (ref 98–111)
Creatinine, Ser: 0.58 mg/dL (ref 0.44–1.00)
GFR, Estimated: >60 mL/min (ref >60)
Glucose, Bld: 80 mg/dL (ref 70–99)
Potassium: 4.2 mmol/L (ref 3.5–5.1)
Sodium: 136 mmol/L (ref 135–145)
Total Bilirubin: 1 mg/dL (ref 0.3–1.2)
Total Protein: 5.5 g/dL — ABNORMAL LOW (ref 6.5–8.1)

## 2021-12-25 LAB — MAGNESIUM: Magnesium: 2 mg/dL (ref 1.7–2.4)

## 2021-12-25 MED ORDER — ENSURE ENLIVE PO LIQD
237.0000 mL | Freq: Two times a day (BID) | ORAL | Status: DC
  Administered 2021-12-26 – 2021-12-27 (×3): 237 mL via ORAL

## 2021-12-25 NOTE — Progress Notes (Signed)
PT Cancellation Note  Patient Details Name: Mallory Ayers MRN: [id number] DOB: 1970/06/28   Cancelled Treatment:    Reason Eval/Treat Not Completed: PT screened, no needs identified, will sign off. Pt is at baseline and is transferring to power w/c, bed, commode independently.    Shary Decamp [facility] 12/25/2021, 10:39 AM [place] Office [phone]

## 2021-12-25 NOTE — Progress Notes (Signed)
OT Cancellation Note and Discharge from OT  Patient Details Name: KAROLINE FLEER MRN: [id number] DOB: 06-Apr-1971   Cancelled Treatment:    Reason Eval/Treat Not Completed: OT screened, no needs identified, will sign off. Pt is at baseline and is transferring to power w/c, bed, commode independently, no questions or concerns about ADL completion at this time. Should new weakness/deficits occur, please feel free to re-consult. Pt with no questions or concerns at this time. OT to sign off.   Merri Ray Gavan Nordby 12/25/2021, 12:42 PM  Jesse Sans OTR/L [place] Office: [phone]
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
## 2021-12-26 DIAGNOSIS — E8809 Other disorders of plasma-protein metabolism, not elsewhere classified: Secondary | ICD-10-CM | POA: Diagnosis not present

## 2021-12-26 DIAGNOSIS — R7401 Elevation of levels of liver transaminase levels: Secondary | ICD-10-CM | POA: Diagnosis not present

## 2021-12-26 DIAGNOSIS — L03114 Cellulitis of left upper limb: Secondary | ICD-10-CM | POA: Diagnosis not present

## 2021-12-26 DIAGNOSIS — I469 Cardiac arrest, cause unspecified: Secondary | ICD-10-CM | POA: Diagnosis not present

## 2021-12-26 LAB — MAGNESIUM: Magnesium: 1.7 mg/dL (ref 1.7–2.4)

## 2021-12-26 LAB — CBC WITH DIFFERENTIAL/PLATELET
Abs Immature Granulocytes: 0.01 10*3/uL (ref 0.00–0.07)
Basophils Absolute: 0 10*3/uL (ref 0.0–0.1)
Basophils Relative: 0 %
Eosinophils Absolute: 0.1 10*3/uL (ref 0.0–0.5)
Eosinophils Relative: 1 %
HCT: 26.7 % — ABNORMAL LOW (ref 36.0–46.0)
Hemoglobin: 8.8 g/dL — ABNORMAL LOW (ref 12.0–15.0)
Immature Granulocytes: 0 %
Lymphocytes Relative: 31 %
Lymphs Abs: 2.4 10*3/uL (ref 0.7–4.0)
MCH: 34.2 pg — ABNORMAL HIGH (ref 26.0–34.0)
MCHC: 33 g/dL (ref 30.0–36.0)
MCV: 103.9 fL — ABNORMAL HIGH (ref 80.0–100.0)
Monocytes Absolute: 1 10*3/uL (ref 0.1–1.0)
Monocytes Relative: 12 %
Neutro Abs: 4.3 10*3/uL (ref 1.7–7.7)
Neutrophils Relative %: 56 %
Platelets: 48 10*3/uL — ABNORMAL LOW (ref 150–400)
RBC: 2.57 MIL/uL — ABNORMAL LOW (ref 3.87–5.11)
RDW: 20 % — ABNORMAL HIGH (ref 11.5–15.5)
WBC: 7.8 10*3/uL (ref 4.0–10.5)
nRBC: 0 % (ref 0.0–0.2)

## 2021-12-26 LAB — COMPREHENSIVE METABOLIC PANEL
ALT: 30 U/L (ref 0–44)
AST: 118 U/L — ABNORMAL HIGH (ref 15–41)
Albumin: 1.9 g/dL — ABNORMAL LOW (ref 3.5–5.0)
Alkaline Phosphatase: 418 U/L — ABNORMAL HIGH (ref 38–126)
Anion gap: 5 (ref 5–15)
BUN: <5 mg/dL — ABNORMAL LOW (ref 6–20)
CO2: 26 mmol/L (ref 22–32)
Calcium: 7.7 mg/dL — ABNORMAL LOW (ref 8.9–10.3)
Chloride: 105 mmol/L (ref 98–111)
Creatinine, Ser: 0.53 mg/dL (ref 0.44–1.00)
GFR, Estimated: >60 mL/min (ref >60)
Glucose, Bld: 80 mg/dL (ref 70–99)
Potassium: 4.1 mmol/L (ref 3.5–5.1)
Sodium: 136 mmol/L (ref 135–145)
Total Bilirubin: 1 mg/dL (ref 0.3–1.2)
Total Protein: 5.5 g/dL — ABNORMAL LOW (ref 6.5–8.1)

## 2021-12-26 LAB — PATHOLOGIST SMEAR REVIEW

## 2021-12-26 MED ORDER — MAGNESIUM SULFATE 2 GM/50ML IV SOLN
2.0000 g | Freq: Once | INTRAVENOUS | Status: AC
  Administered 2021-12-26: 2 g via INTRAVENOUS
  Filled 2021-12-26: qty 50

## 2021-12-26 MED ORDER — POTASSIUM CHLORIDE CRYS ER 20 MEQ PO TBCR
20.0000 meq | EXTENDED_RELEASE_TABLET | Freq: Every day | ORAL | Status: DC
  Administered 2021-12-26 – 2021-12-27 (×2): 20 meq via ORAL
  Filled 2021-12-26 (×2): qty 1
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
## 2021-12-26 NOTE — Care Management Important Message (Signed)
Important Message  Patient Details  Name: Mallory Ayers MRN: [id number] Date of Birth: Feb 28, 1971   Medicare Important Message Given:  Yes     [name] 12/26/2021, 10:17 AM

## 2021-12-27 DIAGNOSIS — R7401 Elevation of levels of liver transaminase levels: Secondary | ICD-10-CM | POA: Diagnosis not present

## 2021-12-27 DIAGNOSIS — E876 Hypokalemia: Secondary | ICD-10-CM | POA: Diagnosis not present

## 2021-12-27 DIAGNOSIS — L03114 Cellulitis of left upper limb: Secondary | ICD-10-CM | POA: Diagnosis not present

## 2021-12-27 DIAGNOSIS — E8809 Other disorders of plasma-protein metabolism, not elsewhere classified: Secondary | ICD-10-CM | POA: Diagnosis not present

## 2021-12-27 LAB — CBC WITH DIFFERENTIAL/PLATELET
Abs Immature Granulocytes: 0.02 10*3/uL (ref 0.00–0.07)
Basophils Absolute: 0 10*3/uL (ref 0.0–0.1)
Basophils Relative: 0 %
Eosinophils Absolute: 0 10*3/uL (ref 0.0–0.5)
Eosinophils Relative: 1 %
HCT: 25.8 % — ABNORMAL LOW (ref 36.0–46.0)
Hemoglobin: 8.5 g/dL — ABNORMAL LOW (ref 12.0–15.0)
Immature Granulocytes: 0 %
Lymphocytes Relative: 17 %
Lymphs Abs: 1.2 10*3/uL (ref 0.7–4.0)
MCH: 33.6 pg (ref 26.0–34.0)
MCHC: 32.9 g/dL (ref 30.0–36.0)
MCV: 102 fL — ABNORMAL HIGH (ref 80.0–100.0)
Monocytes Absolute: 1.1 10*3/uL — ABNORMAL HIGH (ref 0.1–1.0)
Monocytes Relative: 16 %
Neutro Abs: 4.5 10*3/uL (ref 1.7–7.7)
Neutrophils Relative %: 66 %
Platelets: 61 10*3/uL — ABNORMAL LOW (ref 150–400)
RBC: 2.53 MIL/uL — ABNORMAL LOW (ref 3.87–5.11)
RDW: 19.9 % — ABNORMAL HIGH (ref 11.5–15.5)
WBC: 6.9 10*3/uL (ref 4.0–10.5)
nRBC: 0 % (ref 0.0–0.2)

## 2021-12-27 LAB — COMPREHENSIVE METABOLIC PANEL
ALT: 25 U/L (ref 0–44)
AST: 106 U/L — ABNORMAL HIGH (ref 15–41)
Albumin: 2 g/dL — ABNORMAL LOW (ref 3.5–5.0)
Alkaline Phosphatase: 428 U/L — ABNORMAL HIGH (ref 38–126)
Anion gap: 10 (ref 5–15)
BUN: <5 mg/dL — ABNORMAL LOW (ref 6–20)
CO2: 25 mmol/L (ref 22–32)
Calcium: 8.1 mg/dL — ABNORMAL LOW (ref 8.9–10.3)
Chloride: 103 mmol/L (ref 98–111)
Creatinine, Ser: 0.52 mg/dL (ref 0.44–1.00)
GFR, Estimated: >60 mL/min (ref >60)
Glucose, Bld: 84 mg/dL (ref 70–99)
Potassium: 3.7 mmol/L (ref 3.5–5.1)
Sodium: 138 mmol/L (ref 135–145)
Total Bilirubin: 1.3 mg/dL — ABNORMAL HIGH (ref 0.3–1.2)
Total Protein: 5.7 g/dL — ABNORMAL LOW (ref 6.5–8.1)

## 2021-12-27 LAB — MAGNESIUM: Magnesium: 2 mg/dL (ref 1.7–2.4)

## 2021-12-27 MED ORDER — CEPHALEXIN 500 MG PO CAPS: 500.0000 mg | ORAL_CAPSULE | Freq: Four times a day (QID) | ORAL | 0 refills | Status: AC

## 2021-12-27 MED ORDER — HEPARIN SOD (PORK) LOCK FLUSH 100 UNIT/ML IV SOLN
500.0000 [IU] | INTRAVENOUS | Status: AC | PRN
  Administered 2021-12-27: 500 [IU]

## 2021-12-27 NOTE — Discharge Summary (Signed)
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 12/24/21  4:20 AM   Specimen: Nasal Mucosa; Nasal Swab  Result Value Ref Range Status   MRSA by PCR Next Gen NOT DETECTED NOT DETECTED Final    Comment: (NOTE) The GeneXpert MRSA Assay (FDA approved for NASAL specimens only), is one component of a comprehensive MRSA colonization surveillance program. It is not intended to diagnose MRSA infection nor to guide or monitor treatment for MRSA infections. Test performance is not FDA approved in patients less than 18 years old. Performed at [facility], [place] [address], [place],  [address]      Time coordinating discharge: 35 minutes  SIGNED:   Aline August, MD  [facility] 12/27/2021, 10:20 AM

## 2021-12-28 ENCOUNTER — Telehealth: Payer: Self-pay

## 2021-12-28 ENCOUNTER — Other Ambulatory Visit: Payer: Self-pay | Admitting: Oncology

## 2021-12-28 DIAGNOSIS — C50411 Malignant neoplasm of upper-outer quadrant of right female breast: Secondary | ICD-10-CM

## 2021-12-28 LAB — CULTURE, BLOOD (ROUTINE X 2)
Culture: NO GROWTH
Culture: NO GROWTH
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
## 2022-01-01 ENCOUNTER — Telehealth: Payer: Self-pay | Admitting: Nurse Practitioner

## 2022-01-01 ENCOUNTER — Other Ambulatory Visit: Payer: Self-pay | Admitting: Nurse Practitioner

## 2022-01-01 ENCOUNTER — Ambulatory Visit: Payer: Medicare HMO | Attending: Student | Admitting: Student

## 2022-01-01 ENCOUNTER — Encounter: Payer: Self-pay | Admitting: Student

## 2022-01-01 ENCOUNTER — Other Ambulatory Visit: Payer: Self-pay

## 2022-01-01 VITALS — BP 104/62 | HR 74 | Ht <= 58 in | Wt 165.0 lb

## 2022-01-01 DIAGNOSIS — I4729 Other ventricular tachycardia: Secondary | ICD-10-CM

## 2022-01-01 DIAGNOSIS — I469 Cardiac arrest, cause unspecified: Secondary | ICD-10-CM

## 2022-01-01 DIAGNOSIS — E876 Hypokalemia: Secondary | ICD-10-CM

## 2022-01-01 MED ORDER — POTASSIUM CHLORIDE CRYS ER 20 MEQ PO TBCR: 40.0000 meq | EXTENDED_RELEASE_TABLET | Freq: Two times a day (BID) | ORAL | 6 refills | Status: DC

## 2022-01-01 NOTE — Patient Instructions (Signed)
Medication Instructions:  Your physician recommends that you continue on your current medications as directed. Please refer to the Current Medication list given to you today.  *If you need a refill on your cardiac medications before your next appointment, please call your pharmacy*   Lab Work: TODAY: BMET, CBC, Mag  If you have labs (blood work) drawn today and your tests are completely normal, you will receive your results only by: [place] (if you have MyChart) OR A paper copy in the mail If you have any lab test that is abnormal or we need to change your treatment, we will call you to review the results.   Follow-Up: At [facility], you and your health needs are our priority.  As part of our continuing mission to provide you with exceptional heart care, we have created designated Provider Care Teams.  These Care Teams include your primary Cardiologist (physician) and Advanced Practice Providers (APPs -  Physician Assistants and Nurse Practitioners) who all work together to provide you with the care you need, when you need it.  Your next appointment:   01/29/2022

## 2022-01-01 NOTE — Telephone Encounter (Signed)
   Received call from [place] this evening r/t critical potassium of 2.9 drawn earlier today.  I was able to get in touch w/ Mallory Ayers. She is currently taking KCl 20 meq BID.  She has not yet taken a dose tonight.  I rec that she take 40 meq now and 40 meq again in 2 hrs.  She will increase daily dose to 40 meq BID (Rx sent to her pharmacy - #120, six refills).  I advised that she will need a f/u bmet, preferably on Friday or Monday, and I will ask the office to arrange this for her.  Lab Results  Component Value Date   CREATININE 0.44 (L) 01/01/2022   BUN 3 (L) 01/01/2022   NA 140 01/01/2022   K 2.9 (LL) 01/01/2022   CL 101 01/01/2022   CO2 26 01/01/2022     Caller verbalized understanding and was grateful for the call back.  Murray Hodgkins, NP 01/01/2022, 7:49 PM

## 2022-01-02 ENCOUNTER — Other Ambulatory Visit: Payer: Self-pay

## 2022-01-02 ENCOUNTER — Encounter (HOSPITAL_COMMUNITY): Payer: Self-pay | Admitting: Student

## 2022-01-02 ENCOUNTER — Ambulatory Visit: Payer: Medicare HMO

## 2022-01-02 DIAGNOSIS — E876 Hypokalemia: Secondary | ICD-10-CM

## 2022-01-02 LAB — BASIC METABOLIC PANEL
BUN/Creatinine Ratio: 7 — ABNORMAL LOW (ref 9–23)
BUN: 3 mg/dL — ABNORMAL LOW (ref 6–24)
CO2: 26 mmol/L (ref 20–29)
Calcium: 7.8 mg/dL — ABNORMAL LOW (ref 8.7–10.2)
Chloride: 101 mmol/L (ref 96–106)
Creatinine, Ser: 0.44 mg/dL — ABNORMAL LOW (ref 0.57–1.00)
Glucose: 77 mg/dL (ref 70–99)
Potassium: 2.9 mmol/L — CL (ref 3.5–5.2)
Sodium: 140 mmol/L (ref 134–144)
eGFR: 118 mL/min/{1.73_m2} (ref >59)

## 2022-01-02 LAB — CBC
Hematocrit: 27.4 % — ABNORMAL LOW (ref 34.0–46.6)
Hemoglobin: 9.5 g/dL — ABNORMAL LOW (ref 11.1–15.9)
MCH: 32.2 pg (ref 26.6–33.0)
MCHC: 34.7 g/dL (ref 31.5–35.7)
MCV: 93 fL (ref 79–97)
Platelets: 157 10*3/uL (ref 150–450)
RBC: 2.95 x10E6/uL — ABNORMAL LOW (ref 3.77–5.28)
RDW: 15.1 % (ref 11.7–15.4)
WBC: 7.1 10*3/uL (ref 3.4–10.8)

## 2022-01-02 LAB — MAGNESIUM: Magnesium: 1.7 mg/dL (ref 1.6–2.3)

## 2022-01-02 MED ORDER — MAGNESIUM OXIDE 400 MG PO TABS: 400.0000 mg | ORAL_TABLET | Freq: Every day | ORAL | 3 refills | Status: AC

## 2022-01-02 NOTE — Telephone Encounter (Signed)
I spoke with pt and she is going to [place] office Friday AM to have labs done.

## 2022-01-03 ENCOUNTER — Encounter: Payer: Self-pay | Admitting: Hematology and Oncology

## 2022-01-04 LAB — BASIC METABOLIC PANEL
BUN/Creatinine Ratio: 4 — ABNORMAL LOW (ref 9–23)
BUN: 2 mg/dL — ABNORMAL LOW (ref 6–24)
CO2: 23 mmol/L (ref 20–29)
Calcium: 8.7 mg/dL (ref 8.7–10.2)
Chloride: 100 mmol/L (ref 96–106)
Creatinine, Ser: 0.46 mg/dL — ABNORMAL LOW (ref 0.57–1.00)
Glucose: 78 mg/dL (ref 70–99)
Potassium: 3.9 mmol/L (ref 3.5–5.2)
Sodium: 138 mmol/L (ref 134–144)
eGFR: 117 mL/min/{1.73_m2} (ref >59)

## 2022-01-07 ENCOUNTER — Other Ambulatory Visit: Payer: Self-pay

## 2022-01-07 DIAGNOSIS — E876 Hypokalemia: Secondary | ICD-10-CM

## 2022-01-08 ENCOUNTER — Telehealth (HOSPITAL_COMMUNITY): Payer: Self-pay | Admitting: *Deleted

## 2022-01-08 NOTE — Telephone Encounter (Signed)
Patient given detailed instructions per Myocardial Perfusion Study Information Sheet for the test on 01/14/2022 at 10:15. Patient notified to arrive 15 minutes early and that it is imperative to arrive on time for appointment to keep from having the test rescheduled.  If you need to cancel or reschedule your appointment, please call the office within 24 hours of your appointment. . Patient verbalized understanding.Mallory Ayers

## 2022-01-09 ENCOUNTER — Other Ambulatory Visit: Payer: Self-pay | Admitting: Student

## 2022-01-09 DIAGNOSIS — I4729 Other ventricular tachycardia: Secondary | ICD-10-CM

## 2022-01-09 DIAGNOSIS — I469 Cardiac arrest, cause unspecified: Secondary | ICD-10-CM

## 2022-01-14 ENCOUNTER — Ambulatory Visit (HOSPITAL_COMMUNITY): Payer: Medicare HMO | Attending: Student

## 2022-01-14 ENCOUNTER — Telehealth: Payer: Self-pay | Admitting: Cardiology

## 2022-01-14 ENCOUNTER — Ambulatory Visit: Payer: Medicare HMO

## 2022-01-14 DIAGNOSIS — E876 Hypokalemia: Secondary | ICD-10-CM | POA: Diagnosis present

## 2022-01-14 DIAGNOSIS — R7989 Other specified abnormal findings of blood chemistry: Secondary | ICD-10-CM | POA: Insufficient documentation

## 2022-01-14 DIAGNOSIS — Z87891 Personal history of nicotine dependence: Secondary | ICD-10-CM | POA: Insufficient documentation

## 2022-01-14 DIAGNOSIS — Z8674 Personal history of sudden cardiac arrest: Secondary | ICD-10-CM | POA: Insufficient documentation

## 2022-01-14 DIAGNOSIS — Z7901 Long term (current) use of anticoagulants: Secondary | ICD-10-CM | POA: Insufficient documentation

## 2022-01-14 DIAGNOSIS — I4729 Other ventricular tachycardia: Secondary | ICD-10-CM | POA: Diagnosis not present

## 2022-01-14 DIAGNOSIS — I10 Essential (primary) hypertension: Secondary | ICD-10-CM | POA: Diagnosis not present

## 2022-01-14 DIAGNOSIS — Z7982 Long term (current) use of aspirin: Secondary | ICD-10-CM | POA: Diagnosis not present

## 2022-01-14 LAB — BASIC METABOLIC PANEL
BUN/Creatinine Ratio: 8 — ABNORMAL LOW (ref 9–23)
BUN: 5 mg/dL — ABNORMAL LOW (ref 6–24)
CO2: 21 mmol/L (ref 20–29)
Calcium: 7.8 mg/dL — ABNORMAL LOW (ref 8.7–10.2)
Chloride: 100 mmol/L (ref 96–106)
Creatinine, Ser: 0.64 mg/dL (ref 0.57–1.00)
Glucose: 70 mg/dL (ref 70–99)
Potassium: 2.8 mmol/L — CL (ref 3.5–5.2)
Sodium: 134 mmol/L (ref 134–144)
eGFR: 108 mL/min/{1.73_m2} (ref >59)

## 2022-01-14 MED ORDER — TECHNETIUM TC 99M TETROFOSMIN IV KIT
10.1000 | PACK | Freq: Once | INTRAVENOUS | Status: AC | PRN
  Administered 2022-01-14: 10.1 via INTRAVENOUS

## 2022-01-14 NOTE — Telephone Encounter (Signed)
Pt has K+ 2.8 today, she has taken 40 meq once and will take another 40 now and again at bedtime. She will take 40 meq TID tomorrow -  Jonni Sanger can you direct her on repeat labs please

## 2022-01-15 ENCOUNTER — Ambulatory Visit (HOSPITAL_COMMUNITY): Payer: Medicare HMO | Attending: Cardiology

## 2022-01-15 DIAGNOSIS — I4729 Other ventricular tachycardia: Secondary | ICD-10-CM | POA: Diagnosis not present

## 2022-01-15 LAB — MYOCARDIAL PERFUSION IMAGING
Base ST Depression (mm): 0 mm
LV dias vol: 75 mL (ref 46–106)
LV sys vol: 27 mL
Nuc Stress EF: 64 %
Peak HR: 73 {beats}/min
Rest HR: 65 {beats}/min
Rest Nuclear Isotope Dose: 10.1 mCi
SDS: 0
SRS: 0
SSS: 0
ST Depression (mm): 0 mm
Stress Nuclear Isotope Dose: 32.9 mCi
TID: 0.92

## 2022-01-15 MED ORDER — TECHNETIUM TC 99M TETROFOSMIN IV KIT
32.9000 | PACK | Freq: Once | INTRAVENOUS | Status: AC | PRN
  Administered 2022-01-15: 32.9 via INTRAVENOUS

## 2022-01-15 MED ORDER — REGADENOSON 0.4 MG/5ML IV SOLN
0.4000 mg | Freq: Once | INTRAVENOUS | Status: AC
  Administered 2022-01-15: 0.4 mg via INTRAVENOUS

## 2022-01-16 ENCOUNTER — Other Ambulatory Visit: Payer: Self-pay

## 2022-01-16 DIAGNOSIS — E876 Hypokalemia: Secondary | ICD-10-CM

## 2022-01-16 MED ORDER — SPIRONOLACTONE 25 MG PO TABS: 25.0000 mg | ORAL_TABLET | Freq: Every day | ORAL | 3 refills | Status: DC

## 2022-01-16 NOTE — Telephone Encounter (Signed)
Spoke with pt. She will go to [place] office tomorrow to have labs done and will start new medication.

## 2022-01-16 NOTE — Telephone Encounter (Addendum)
Per Oda Kilts PA:   Needs BMET Wednesday or Thursday.   Please also add spironolactone 25 mg daily   Thank you!   Left message to call back.

## 2022-01-17 ENCOUNTER — Telehealth: Payer: Self-pay

## 2022-01-17 ENCOUNTER — Telehealth: Payer: Self-pay | Admitting: Oncology

## 2022-01-17 LAB — BASIC METABOLIC PANEL
BUN/Creatinine Ratio: 4 — ABNORMAL LOW (ref 9–23)
BUN: 2 mg/dL — ABNORMAL LOW (ref 6–24)
CO2: 21 mmol/L (ref 20–29)
Calcium: 8.1 mg/dL — ABNORMAL LOW (ref 8.7–10.2)
Chloride: 101 mmol/L (ref 96–106)
Creatinine, Ser: 0.57 mg/dL (ref 0.57–1.00)
Glucose: 82 mg/dL (ref 70–99)
Potassium: 3.6 mmol/L (ref 3.5–5.2)
Sodium: 136 mmol/L (ref 134–144)
eGFR: 111 mL/min/{1.73_m2} (ref >59)

## 2022-01-17 NOTE — Telephone Encounter (Signed)
Contacted pt to schedule an appt. Unable to reach via phone, voicemail was left.   appt with Dr. Hinton Rao per note on 01/17/2022. Received: Today Georgette Shell, RN sent to [facility] Scheduling Please schedule follow up with Dr. Hinton Rao in the next couple of weeks.

## 2022-01-17 NOTE — Telephone Encounter (Signed)
   01/17/2022 Kelli Mosher,PA - Good morning! Do you know what is going on with Mallory Ayers? I thought we were scheduling f/u after she was d/c'd. I received paperwork on her today and I don't see an appt.  RE: Update on patient from her daughter, Mallory Ayers Received: 2 weeks ago [name], MD  Dairl Ponder, RN; [name], PA-C; Phy, Beverly Gust, [facility]; Belva Chimes, LPN Would like to see in the next few weeks, when she feels up to it. With labs. We had already discussed that we were stopping chemo in view of all her complications and other problems.

## 2022-01-17 NOTE — Telephone Encounter (Signed)
I spoke with Mallory Ayers and told her I would have scheduling call her to set up a follow up with Dr. Hinton Rao.

## 2022-01-18 MED ORDER — POTASSIUM CHLORIDE CRYS ER 20 MEQ PO TBCR: 40.0000 meq | EXTENDED_RELEASE_TABLET | Freq: Three times a day (TID) | ORAL | 3 refills | Status: DC

## 2022-01-18 NOTE — Addendum Note (Signed)
Addended by: Carylon Perches on: 01/18/2022 04:57 PM   Modules accepted: Orders

## 2022-01-21 ENCOUNTER — Encounter: Payer: Self-pay | Admitting: Hematology and Oncology

## 2022-01-21 ENCOUNTER — Telehealth: Payer: Self-pay | Admitting: Oncology

## 2022-01-21 NOTE — Telephone Encounter (Signed)
Patient has been scheduled. Aware of appt date and time    appt with Dr. Hinton Rao per note on 01/17/2022. Received: 4 days ago Georgette Shell, RN sent to [facility] Scheduling Please schedule follow up with Dr. Hinton Rao in the next couple of weeks.

## 2022-01-21 NOTE — Progress Notes (Unsigned)
Clarification of diagnosis:   Myocardial Perfusion study ordered in the setting of Ventricular Tachycardia, Cardiac arrest, and elevated troponins.  Legrand Como [address] [place], PA-C  01/21/2022 1:57 PM

## 2022-01-22 LAB — BASIC METABOLIC PANEL
BUN/Creatinine Ratio: 4 — ABNORMAL LOW (ref 9–23)
BUN: 3 mg/dL — ABNORMAL LOW (ref 6–24)
CO2: 21 mmol/L (ref 20–29)
Calcium: 8.2 mg/dL — ABNORMAL LOW (ref 8.7–10.2)
Chloride: 101 mmol/L (ref 96–106)
Creatinine, Ser: 0.71 mg/dL (ref 0.57–1.00)
Glucose: 100 mg/dL — ABNORMAL HIGH (ref 70–99)
Potassium: 3.5 mmol/L (ref 3.5–5.2)
Sodium: 138 mmol/L (ref 134–144)
eGFR: 104 mL/min/{1.73_m2} (ref >59)

## 2022-01-23 ENCOUNTER — Other Ambulatory Visit: Payer: Self-pay

## 2022-01-23 MED ORDER — SPIRONOLACTONE 25 MG PO TABS: 25.0000 mg | ORAL_TABLET | Freq: Two times a day (BID) | ORAL | 3 refills | Status: DC

## 2022-01-23 NOTE — Progress Notes (Unsigned)
  Clarification of diagnosis:    Myocardial Perfusion study ordered in the setting of Ventricular Tachycardia, Cardiac arrest, and elevated troponins.  Mallory Ayers [address]" [place], PA-C  01/21/2022 1:57 PM

## 2022-01-29 ENCOUNTER — Encounter: Payer: Self-pay | Admitting: *Deleted

## 2022-01-29 ENCOUNTER — Other Ambulatory Visit: Payer: Self-pay | Admitting: *Deleted

## 2022-01-29 ENCOUNTER — Ambulatory Visit: Payer: Medicare HMO | Attending: Cardiology | Admitting: Cardiology

## 2022-01-29 ENCOUNTER — Encounter: Payer: Self-pay | Admitting: Cardiology

## 2022-01-29 VITALS — BP 106/73 | HR 94 | Ht <= 58 in | Wt 142.0 lb

## 2022-01-29 DIAGNOSIS — I4729 Other ventricular tachycardia: Secondary | ICD-10-CM | POA: Diagnosis not present

## 2022-01-29 DIAGNOSIS — I469 Cardiac arrest, cause unspecified: Secondary | ICD-10-CM | POA: Diagnosis not present

## 2022-01-29 DIAGNOSIS — Z01818 Encounter for other preprocedural examination: Secondary | ICD-10-CM

## 2022-01-29 NOTE — Patient Instructions (Addendum)
Medication Instructions:  None  *If you need a refill on your cardiac medications before your next appointment, please call your pharmacy*   Lab Work: [facility] and MAG TODAY  Nov 10 anytime from 8 am to 4 pm, no fasting.  If you have labs (blood work) drawn today and your tests are completely normal, you will receive your results only by: [place] (if you have MyChart) OR A paper copy in the mail If you have any lab test that is abnormal or we need to change your treatment, we will call you to review the results.   Testing/Procedures: Your physician has recommended that you have a defibrillator inserted. An implantable cardioverter defibrillator (ICD) is a small device that is placed in your chest or, in rare cases, your abdomen. This device uses electrical pulses or shocks to help control life-threatening, irregular heartbeats that could lead the heart to suddenly stop beating (sudden cardiac arrest). Leads are attached to the ICD that goes into your heart. This is done in the hospital and usually requires an overnight stay. Please see the instruction sheet given to you today for more information.   Follow-Up: At [facility], you and your health needs are our priority.  As part of our continuing mission to provide you with exceptional heart care, we have created designated Provider Care Teams.  These Care Teams include your primary Cardiologist (physician) and Advanced Practice Providers (APPs -  Physician Assistants and Nurse Practitioners) who all work together to provide you with the care you need, when you need it.  We recommend signing up for the patient portal called "MyChart".  Sign up information is provided on this After Visit Summary.  MyChart is used to connect with patients for Virtual Visits (Telemedicine).  Patients are able to view lab/test results, encounter notes, upcoming appointments, etc.  Non-urgent messages can be sent to your provider as well.   To learn more  about what you can do with MyChart, go to [email or web address].    Your next appointment:   See instruction letter.   Important Information About [place]
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
## 2022-01-30 LAB — BASIC METABOLIC PANEL
BUN/Creatinine Ratio: 5 — ABNORMAL LOW (ref 9–23)
BUN: 3 mg/dL — ABNORMAL LOW (ref 6–24)
CO2: 21 mmol/L (ref 20–29)
Calcium: 8.1 mg/dL — ABNORMAL LOW (ref 8.7–10.2)
Chloride: 104 mmol/L (ref 96–106)
Creatinine, Ser: 0.64 mg/dL (ref 0.57–1.00)
Glucose: 91 mg/dL (ref 70–99)
Potassium: 4.4 mmol/L (ref 3.5–5.2)
Sodium: 139 mmol/L (ref 134–144)
eGFR: 108 mL/min/{1.73_m2} (ref >59)

## 2022-01-30 LAB — MAGNESIUM: Magnesium: 1.8 mg/dL (ref 1.6–2.3)

## 2022-01-31 ENCOUNTER — Encounter (HOSPITAL_COMMUNITY): Payer: Self-pay | Admitting: Emergency Medicine

## 2022-01-31 ENCOUNTER — Other Ambulatory Visit: Payer: Self-pay

## 2022-01-31 ENCOUNTER — Inpatient Hospital Stay (HOSPITAL_COMMUNITY)
Admission: EM | Admit: 2022-01-31 | Discharge: 2022-02-03 | DRG: 372 | Disposition: A | Discharge status: Home / Self Care | Payer: Medicare HMO | Attending: Internal Medicine | Admitting: Internal Medicine

## 2022-01-31 ENCOUNTER — Encounter (HOSPITAL_BASED_OUTPATIENT_CLINIC_OR_DEPARTMENT_OTHER): Payer: Self-pay

## 2022-01-31 DIAGNOSIS — Z79899 Other long term (current) drug therapy: Secondary | ICD-10-CM

## 2022-01-31 DIAGNOSIS — R188 Other ascites: Secondary | ICD-10-CM | POA: Diagnosis present

## 2022-01-31 DIAGNOSIS — Z9884 Bariatric surgery status: Secondary | ICD-10-CM

## 2022-01-31 DIAGNOSIS — E876 Hypokalemia: Secondary | ICD-10-CM | POA: Diagnosis present

## 2022-01-31 DIAGNOSIS — A0472 Enterocolitis due to Clostridium difficile, not specified as recurrent: Principal | ICD-10-CM | POA: Diagnosis present

## 2022-01-31 DIAGNOSIS — I472 Ventricular tachycardia, unspecified: Secondary | ICD-10-CM | POA: Diagnosis present

## 2022-01-31 DIAGNOSIS — Z8674 Personal history of sudden cardiac arrest: Secondary | ICD-10-CM

## 2022-01-31 DIAGNOSIS — G4733 Obstructive sleep apnea (adult) (pediatric): Secondary | ICD-10-CM | POA: Diagnosis present

## 2022-01-31 DIAGNOSIS — E039 Hypothyroidism, unspecified: Secondary | ICD-10-CM | POA: Diagnosis present

## 2022-01-31 DIAGNOSIS — K922 Gastrointestinal hemorrhage, unspecified: Secondary | ICD-10-CM | POA: Diagnosis present

## 2022-01-31 DIAGNOSIS — Z7951 Long term (current) use of inhaled steroids: Secondary | ICD-10-CM

## 2022-01-31 DIAGNOSIS — Z803 Family history of malignant neoplasm of breast: Secondary | ICD-10-CM

## 2022-01-31 DIAGNOSIS — Z95810 Presence of automatic (implantable) cardiac defibrillator: Secondary | ICD-10-CM

## 2022-01-31 DIAGNOSIS — Z9221 Personal history of antineoplastic chemotherapy: Secondary | ICD-10-CM

## 2022-01-31 DIAGNOSIS — K921 Melena: Secondary | ICD-10-CM

## 2022-01-31 DIAGNOSIS — Z9011 Acquired absence of right breast and nipple: Secondary | ICD-10-CM

## 2022-01-31 DIAGNOSIS — T45515A Adverse effect of anticoagulants, initial encounter: Secondary | ICD-10-CM | POA: Diagnosis present

## 2022-01-31 DIAGNOSIS — D6832 Hemorrhagic disorder due to extrinsic circulating anticoagulants: Secondary | ICD-10-CM | POA: Diagnosis present

## 2022-01-31 DIAGNOSIS — D62 Acute posthemorrhagic anemia: Secondary | ICD-10-CM | POA: Diagnosis present

## 2022-01-31 DIAGNOSIS — Z7982 Long term (current) use of aspirin: Secondary | ICD-10-CM

## 2022-01-31 DIAGNOSIS — Z79890 Hormone replacement therapy (postmenopausal): Secondary | ICD-10-CM

## 2022-01-31 DIAGNOSIS — Z89619 Acquired absence of unspecified leg above knee: Secondary | ICD-10-CM

## 2022-01-31 DIAGNOSIS — I10 Essential (primary) hypertension: Secondary | ICD-10-CM | POA: Diagnosis present

## 2022-01-31 DIAGNOSIS — Z89612 Acquired absence of left leg above knee: Secondary | ICD-10-CM

## 2022-01-31 DIAGNOSIS — Z8249 Family history of ischemic heart disease and other diseases of the circulatory system: Secondary | ICD-10-CM

## 2022-01-31 DIAGNOSIS — K760 Fatty (change of) liver, not elsewhere classified: Secondary | ICD-10-CM | POA: Diagnosis present

## 2022-01-31 DIAGNOSIS — K529 Noninfective gastroenteritis and colitis, unspecified: Principal | ICD-10-CM

## 2022-01-31 DIAGNOSIS — Z6841 Body Mass Index (BMI) 40.0 and over, adult: Secondary | ICD-10-CM

## 2022-01-31 DIAGNOSIS — D696 Thrombocytopenia, unspecified: Secondary | ICD-10-CM | POA: Diagnosis present

## 2022-01-31 DIAGNOSIS — R7989 Other specified abnormal findings of blood chemistry: Secondary | ICD-10-CM | POA: Diagnosis present

## 2022-01-31 DIAGNOSIS — Z86711 Personal history of pulmonary embolism: Secondary | ICD-10-CM | POA: Diagnosis present

## 2022-01-31 DIAGNOSIS — Z89611 Acquired absence of right leg above knee: Secondary | ICD-10-CM

## 2022-01-31 DIAGNOSIS — F32A Depression, unspecified: Secondary | ICD-10-CM | POA: Diagnosis present

## 2022-01-31 DIAGNOSIS — Z853 Personal history of malignant neoplasm of breast: Secondary | ICD-10-CM

## 2022-01-31 DIAGNOSIS — E669 Obesity, unspecified: Secondary | ICD-10-CM | POA: Diagnosis present

## 2022-01-31 DIAGNOSIS — I4581 Long QT syndrome: Secondary | ICD-10-CM | POA: Diagnosis present

## 2022-01-31 DIAGNOSIS — F17290 Nicotine dependence, other tobacco product, uncomplicated: Secondary | ICD-10-CM | POA: Diagnosis present

## 2022-01-31 DIAGNOSIS — Z7901 Long term (current) use of anticoagulants: Secondary | ICD-10-CM

## 2022-01-31 DIAGNOSIS — R112 Nausea with vomiting, unspecified: Secondary | ICD-10-CM | POA: Diagnosis present

## 2022-01-31 LAB — CBC WITH DIFFERENTIAL/PLATELET
Abs Immature Granulocytes: 0.04 10*3/uL (ref 0.00–0.07)
Basophils Absolute: 0 10*3/uL (ref 0.0–0.1)
Basophils Relative: 0 %
Eosinophils Absolute: 0 10*3/uL (ref 0.0–0.5)
Eosinophils Relative: 0 %
HCT: 35.4 % — ABNORMAL LOW (ref 36.0–46.0)
Hemoglobin: 11.6 g/dL — ABNORMAL LOW (ref 12.0–15.0)
Immature Granulocytes: 0 %
Lymphocytes Relative: 6 %
Lymphs Abs: 0.6 10*3/uL — ABNORMAL LOW (ref 0.7–4.0)
MCH: 31.7 pg (ref 26.0–34.0)
MCHC: 32.8 g/dL (ref 30.0–36.0)
MCV: 96.7 fL (ref 80.0–100.0)
Monocytes Absolute: 0.9 10*3/uL (ref 0.1–1.0)
Monocytes Relative: 9 %
Neutro Abs: 8.9 10*3/uL — ABNORMAL HIGH (ref 1.7–7.7)
Neutrophils Relative %: 85 %
Platelets: 197 10*3/uL (ref 150–400)
RBC: 3.66 MIL/uL — ABNORMAL LOW (ref 3.87–5.11)
RDW: 16.2 % — ABNORMAL HIGH (ref 11.5–15.5)
WBC: 10.5 10*3/uL (ref 4.0–10.5)
nRBC: 0 % (ref 0.0–0.2)

## 2022-01-31 LAB — COMPREHENSIVE METABOLIC PANEL
ALT: 18 U/L (ref 0–44)
AST: 73 U/L — ABNORMAL HIGH (ref 15–41)
Albumin: 2.4 g/dL — ABNORMAL LOW (ref 3.5–5.0)
Alkaline Phosphatase: 325 U/L — ABNORMAL HIGH (ref 38–126)
Anion gap: 9 (ref 5–15)
BUN: <5 mg/dL — ABNORMAL LOW (ref 6–20)
CO2: 21 mmol/L — ABNORMAL LOW (ref 22–32)
Calcium: 8.3 mg/dL — ABNORMAL LOW (ref 8.9–10.3)
Chloride: 106 mmol/L (ref 98–111)
Creatinine, Ser: 0.77 mg/dL (ref 0.44–1.00)
GFR, Estimated: >60 mL/min (ref >60)
Glucose, Bld: 161 mg/dL — ABNORMAL HIGH (ref 70–99)
Potassium: 3.7 mmol/L (ref 3.5–5.1)
Sodium: 136 mmol/L (ref 135–145)
Total Bilirubin: 1.5 mg/dL — ABNORMAL HIGH (ref 0.3–1.2)
Total Protein: 6.8 g/dL (ref 6.5–8.1)

## 2022-01-31 LAB — PROTIME-INR
INR: 2.5 — ABNORMAL HIGH (ref 0.8–1.2)
Prothrombin Time: 26.6 seconds — ABNORMAL HIGH (ref 11.4–15.2)

## 2022-01-31 LAB — I-STAT BETA HCG BLOOD, ED (MC, WL, AP ONLY): I-stat hCG, quantitative: <5 m[IU]/mL (ref <5)

## 2022-01-31 MED ORDER — SODIUM CHLORIDE 0.9 % IV BOLUS
500.0000 mL | Freq: Once | INTRAVENOUS | Status: AC
  Administered 2022-01-31: 500 mL via INTRAVENOUS

## 2022-01-31 MED ORDER — ONDANSETRON HCL 4 MG/2ML IJ SOLN
4.0000 mg | Freq: Once | INTRAMUSCULAR | Status: AC
  Administered 2022-01-31: 4 mg via INTRAVENOUS
  Filled 2022-01-31: qty 2

## 2022-01-31 NOTE — ED Notes (Signed)
Patient requesting nausea medicine. Triage RN Legrand Como notified

## 2022-01-31 NOTE — ED Notes (Signed)
This EMT retrieved my X-Ray DT this PT experiencing syncopal episode. PT brought back in for reevaluation

## 2022-01-31 NOTE — ED Provider Triage Note (Signed)
Emergency Medicine Provider Triage Evaluation Note  Mallory Ayers , a 51 y.o. female  was evaluated in triage.  Pt complains of emesis, generalized weakness and rectal bleeding.  The rectal bleeding is been going on for a month, is bright red blood.  There is been a lot more today, patient also had a syncopal event.  Denies any chest pain or shortness of breath, on Eliquis.    Review of Systems  Per HPI  Physical Exam  BP (!) 139/93 (BP Location: Left Arm)   Pulse (!) 115   Temp 97.6 F (36.4 C) (Oral)   Resp 20   SpO2 92%  Gen:   Awake, no distress   Resp:  Normal effort  MSK:   Moves extremities without difficulty  Other:  Ill-appearing, pale.  Tachycardic  Medical Decision Making  Medically screening exam initiated at 6:45 PM.  Appropriate orders placed.  Mallory Ayers was informed that the remainder of the evaluation will be completed by another provider, this initial triage assessment does not replace that evaluation, and the importance of remaining in the ED until their evaluation is complete.     Sherrill Raring, PA-C 01/31/22 1846

## 2022-01-31 NOTE — ED Triage Notes (Signed)
Pt reports emesis and blood in her stool that started last night. Pt also reports dizziness and lightheadedness.

## 2022-02-01 ENCOUNTER — Emergency Department (HOSPITAL_COMMUNITY): Payer: Medicare HMO

## 2022-02-01 DIAGNOSIS — F32A Depression, unspecified: Secondary | ICD-10-CM | POA: Diagnosis present

## 2022-02-01 DIAGNOSIS — Z853 Personal history of malignant neoplasm of breast: Secondary | ICD-10-CM | POA: Diagnosis not present

## 2022-02-01 DIAGNOSIS — R7989 Other specified abnormal findings of blood chemistry: Secondary | ICD-10-CM | POA: Diagnosis present

## 2022-02-01 DIAGNOSIS — Z9884 Bariatric surgery status: Secondary | ICD-10-CM | POA: Diagnosis not present

## 2022-02-01 DIAGNOSIS — Z89612 Acquired absence of left leg above knee: Secondary | ICD-10-CM | POA: Diagnosis not present

## 2022-02-01 DIAGNOSIS — A0472 Enterocolitis due to Clostridium difficile, not specified as recurrent: Secondary | ICD-10-CM | POA: Diagnosis present

## 2022-02-01 DIAGNOSIS — Z7901 Long term (current) use of anticoagulants: Secondary | ICD-10-CM | POA: Diagnosis not present

## 2022-02-01 DIAGNOSIS — R188 Other ascites: Secondary | ICD-10-CM | POA: Diagnosis present

## 2022-02-01 DIAGNOSIS — E669 Obesity, unspecified: Secondary | ICD-10-CM | POA: Diagnosis present

## 2022-02-01 DIAGNOSIS — Z7982 Long term (current) use of aspirin: Secondary | ICD-10-CM | POA: Diagnosis not present

## 2022-02-01 DIAGNOSIS — E039 Hypothyroidism, unspecified: Secondary | ICD-10-CM | POA: Diagnosis present

## 2022-02-01 DIAGNOSIS — I472 Ventricular tachycardia, unspecified: Secondary | ICD-10-CM | POA: Diagnosis present

## 2022-02-01 DIAGNOSIS — Z6841 Body Mass Index (BMI) 40.0 and over, adult: Secondary | ICD-10-CM | POA: Diagnosis not present

## 2022-02-01 DIAGNOSIS — K529 Noninfective gastroenteritis and colitis, unspecified: Secondary | ICD-10-CM | POA: Diagnosis present

## 2022-02-01 DIAGNOSIS — Z89611 Acquired absence of right leg above knee: Secondary | ICD-10-CM | POA: Diagnosis not present

## 2022-02-01 DIAGNOSIS — R933 Abnormal findings on diagnostic imaging of other parts of digestive tract: Secondary | ICD-10-CM | POA: Insufficient documentation

## 2022-02-01 DIAGNOSIS — R195 Other fecal abnormalities: Secondary | ICD-10-CM | POA: Diagnosis not present

## 2022-02-01 DIAGNOSIS — R112 Nausea with vomiting, unspecified: Secondary | ICD-10-CM

## 2022-02-01 DIAGNOSIS — Z79899 Other long term (current) drug therapy: Secondary | ICD-10-CM | POA: Diagnosis not present

## 2022-02-01 DIAGNOSIS — Z87891 Personal history of nicotine dependence: Secondary | ICD-10-CM | POA: Diagnosis not present

## 2022-02-01 DIAGNOSIS — K921 Melena: Secondary | ICD-10-CM

## 2022-02-01 DIAGNOSIS — T45515A Adverse effect of anticoagulants, initial encounter: Secondary | ICD-10-CM | POA: Diagnosis present

## 2022-02-01 DIAGNOSIS — I10 Essential (primary) hypertension: Secondary | ICD-10-CM | POA: Diagnosis present

## 2022-02-01 DIAGNOSIS — D62 Acute posthemorrhagic anemia: Secondary | ICD-10-CM | POA: Diagnosis present

## 2022-02-01 DIAGNOSIS — D6832 Hemorrhagic disorder due to extrinsic circulating anticoagulants: Secondary | ICD-10-CM | POA: Diagnosis present

## 2022-02-01 DIAGNOSIS — D696 Thrombocytopenia, unspecified: Secondary | ICD-10-CM | POA: Diagnosis present

## 2022-02-01 DIAGNOSIS — Z86711 Personal history of pulmonary embolism: Secondary | ICD-10-CM | POA: Diagnosis not present

## 2022-02-01 DIAGNOSIS — K760 Fatty (change of) liver, not elsewhere classified: Secondary | ICD-10-CM | POA: Diagnosis present

## 2022-02-01 DIAGNOSIS — K922 Gastrointestinal hemorrhage, unspecified: Secondary | ICD-10-CM | POA: Diagnosis present

## 2022-02-01 DIAGNOSIS — Z9011 Acquired absence of right breast and nipple: Secondary | ICD-10-CM | POA: Diagnosis not present

## 2022-02-01 DIAGNOSIS — I4581 Long QT syndrome: Secondary | ICD-10-CM | POA: Diagnosis present

## 2022-02-01 LAB — COMPREHENSIVE METABOLIC PANEL
ALT: 16 U/L (ref 0–44)
AST: 68 U/L — ABNORMAL HIGH (ref 15–41)
Albumin: 2 g/dL — ABNORMAL LOW (ref 3.5–5.0)
Alkaline Phosphatase: 276 U/L — ABNORMAL HIGH (ref 38–126)
Anion gap: 10 (ref 5–15)
BUN: <5 mg/dL — ABNORMAL LOW (ref 6–20)
CO2: 22 mmol/L (ref 22–32)
Calcium: 7.9 mg/dL — ABNORMAL LOW (ref 8.9–10.3)
Chloride: 102 mmol/L (ref 98–111)
Creatinine, Ser: 0.69 mg/dL (ref 0.44–1.00)
GFR, Estimated: >60 mL/min (ref >60)
Glucose, Bld: 90 mg/dL (ref 70–99)
Potassium: 4 mmol/L (ref 3.5–5.1)
Sodium: 134 mmol/L — ABNORMAL LOW (ref 135–145)
Total Bilirubin: 1.2 mg/dL (ref 0.3–1.2)
Total Protein: 5.5 g/dL — ABNORMAL LOW (ref 6.5–8.1)

## 2022-02-01 LAB — HEMOGLOBIN AND HEMATOCRIT, BLOOD
HCT: 28.9 % — ABNORMAL LOW (ref 36.0–46.0)
Hemoglobin: 9.4 g/dL — ABNORMAL LOW (ref 12.0–15.0)

## 2022-02-01 LAB — PROTIME-INR
INR: 2.1 — ABNORMAL HIGH (ref 0.8–1.2)
Prothrombin Time: 23.7 seconds — ABNORMAL HIGH (ref 11.4–15.2)

## 2022-02-01 LAB — HEMOGLOBIN: Hemoglobin: 10.3 g/dL — ABNORMAL LOW (ref 12.0–15.0)

## 2022-02-01 LAB — POC OCCULT BLOOD, ED: Fecal Occult Bld: POSITIVE — AB

## 2022-02-01 LAB — MAGNESIUM: Magnesium: 1.6 mg/dL — ABNORMAL LOW (ref 1.7–2.4)

## 2022-02-01 MED ORDER — LORAZEPAM 2 MG/ML IJ SOLN
0.5000 mg | Freq: Four times a day (QID) | INTRAMUSCULAR | Status: DC | PRN
  Administered 2022-02-01 – 2022-02-03 (×5): 0.5 mg via INTRAVENOUS
  Filled 2022-02-01 (×5): qty 1

## 2022-02-01 MED ORDER — SODIUM CHLORIDE 0.9 % IV BOLUS
1000.0000 mL | Freq: Once | INTRAVENOUS | Status: AC
  Administered 2022-02-01: 1000 mL via INTRAVENOUS

## 2022-02-01 MED ORDER — ONDANSETRON HCL 4 MG/2ML IJ SOLN
4.0000 mg | Freq: Once | INTRAMUSCULAR | Status: AC
  Administered 2022-02-01: 4 mg via INTRAVENOUS
  Filled 2022-02-01: qty 2

## 2022-02-01 MED ORDER — PANTOPRAZOLE SODIUM 40 MG IV SOLR
40.0000 mg | Freq: Once | INTRAVENOUS | Status: AC
  Administered 2022-02-01: 40 mg via INTRAVENOUS
  Filled 2022-02-01: qty 10

## 2022-02-01 MED ORDER — SODIUM CHLORIDE 0.9% FLUSH
3.0000 mL | Freq: Two times a day (BID) | INTRAVENOUS | Status: DC
  Administered 2022-02-01 – 2022-02-03 (×3): 3 mL via INTRAVENOUS

## 2022-02-01 MED ORDER — IOHEXOL 350 MG/ML SOLN
75.0000 mL | Freq: Once | INTRAVENOUS | Status: AC | PRN
  Administered 2022-02-01: 75 mL via INTRAVENOUS

## 2022-02-01 MED ORDER — ASPIRIN 81 MG PO TBEC
81.0000 mg | DELAYED_RELEASE_TABLET | Freq: Every day | ORAL | Status: DC
  Administered 2022-02-01: 81 mg via ORAL
  Filled 2022-02-01: qty 1

## 2022-02-01 MED ORDER — ONDANSETRON HCL 4 MG/2ML IJ SOLN: 4.0000 mg | Freq: Four times a day (QID) | INTRAMUSCULAR | Status: DC | PRN

## 2022-02-01 MED ORDER — TRIMETHOBENZAMIDE HCL 100 MG/ML IM SOLN
200.0000 mg | Freq: Three times a day (TID) | INTRAMUSCULAR | Status: DC | PRN
  Administered 2022-02-01 – 2022-02-03 (×5): 200 mg via INTRAMUSCULAR
  Filled 2022-02-01 (×7): qty 2

## 2022-02-01 MED ORDER — PANTOPRAZOLE SODIUM 40 MG IV SOLR
40.0000 mg | Freq: Two times a day (BID) | INTRAVENOUS | Status: DC
  Administered 2022-02-01 – 2022-02-03 (×4): 40 mg via INTRAVENOUS
  Filled 2022-02-01 (×4): qty 10

## 2022-02-01 MED ORDER — MAGNESIUM SULFATE 2 GM/50ML IV SOLN
2.0000 g | Freq: Once | INTRAVENOUS | Status: AC
  Administered 2022-02-01: 2 g via INTRAVENOUS
  Filled 2022-02-01: qty 50
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
## 2022-02-01 NOTE — ED Notes (Signed)
Received verbal report from Jennell Corner RN at this time

## 2022-02-01 NOTE — ED Notes (Signed)
Contacted pharmacy at this time about medication Tigan at this time

## 2022-02-01 NOTE — ED Notes (Signed)
Pt.s husband came up to the desk and informed this RN that patient's life vest battery is out.  He reports that he does not have another battery to replace with.  Pt. Is not wearing her life vest at this time.  Husband is making a few calls to obtain another battery or will have to go home to get the extra.   Orthoptist , pt. Will be placed in Room 23.

## 2022-02-01 NOTE — Hospital Course (Addendum)
Has had n/v and current antiemetics are helping. Feels like she could have a BM now, asked that she let nurse know so we can send off panel. No pain. -added K 40 mEq. If unable to keep down will need to change to IV -c diff and gi panels are ordered -holding dvt ppx until we know gi plan -consider RUQ [place] later for steatosis eval but colitis w/u more important right now and gi on board, can counsel on that   Admitted 01/31/2022  Allergies: Patient has no known allergies. Pertinent Hx: VT arrest 2023 with life arrest, breast cancer s/p R mastectomy 2023 and chemotherapy, bilateral AKA, hypothyroidism, hx PE  51 y.o. female p/w hematochezia  *Hematochezia, n/v: GI following. Hgb stable, hemodynamically stable. On IV protonix 40 mg BID. Avoid QT prolonging agents. Stool panel pending.  *Hx VT arrest: Patient's life vest battery ran out while waiting in ED. On telemetry at this time. Maintaining electrolytes K>4 Mg>2  *Hypothyroidism: Holding levothyroxine until able to tolerate PO  Consults: GI  Meds: ativan, protonix, Tigan VTE ppx: None IVF: None Diet: Soft

## 2022-02-02 DIAGNOSIS — A0472 Enterocolitis due to Clostridium difficile, not specified as recurrent: Secondary | ICD-10-CM | POA: Diagnosis not present

## 2022-02-02 DIAGNOSIS — Z87891 Personal history of nicotine dependence: Secondary | ICD-10-CM

## 2022-02-02 DIAGNOSIS — R195 Other fecal abnormalities: Secondary | ICD-10-CM

## 2022-02-02 LAB — C DIFFICILE QUICK SCREEN W PCR REFLEX
C Diff antigen: POSITIVE — AB
C Diff toxin: NEGATIVE

## 2022-02-02 LAB — BASIC METABOLIC PANEL
Anion gap: 10 (ref 5–15)
BUN: <5 mg/dL — ABNORMAL LOW (ref 6–20)
CO2: 23 mmol/L (ref 22–32)
Calcium: 8 mg/dL — ABNORMAL LOW (ref 8.9–10.3)
Chloride: 104 mmol/L (ref 98–111)
Creatinine, Ser: 0.73 mg/dL (ref 0.44–1.00)
GFR, Estimated: >60 mL/min (ref >60)
Glucose, Bld: 90 mg/dL (ref 70–99)
Potassium: 3.4 mmol/L — ABNORMAL LOW (ref 3.5–5.1)
Sodium: 137 mmol/L (ref 135–145)

## 2022-02-02 LAB — CBC
HCT: 27.8 % — ABNORMAL LOW (ref 36.0–46.0)
Hemoglobin: 9.5 g/dL — ABNORMAL LOW (ref 12.0–15.0)
MCH: 31.7 pg (ref 26.0–34.0)
MCHC: 34.2 g/dL (ref 30.0–36.0)
MCV: 92.7 fL (ref 80.0–100.0)
Platelets: 143 10*3/uL — ABNORMAL LOW (ref 150–400)
RBC: 3 MIL/uL — ABNORMAL LOW (ref 3.87–5.11)
RDW: 16.4 % — ABNORMAL HIGH (ref 11.5–15.5)
WBC: 7 10*3/uL (ref 4.0–10.5)
nRBC: 0 % (ref 0.0–0.2)

## 2022-02-02 LAB — CLOSTRIDIUM DIFFICILE BY PCR, REFLEXED: Toxigenic C. Difficile by PCR: POSITIVE — AB

## 2022-02-02 LAB — HEMOGLOBIN AND HEMATOCRIT, BLOOD
HCT: 29.9 % — ABNORMAL LOW (ref 36.0–46.0)
Hemoglobin: 9.7 g/dL — ABNORMAL LOW (ref 12.0–15.0)

## 2022-02-02 MED ORDER — LEVOTHYROXINE SODIUM 100 MCG PO TABS
100.0000 ug | ORAL_TABLET | Freq: Every day | ORAL | Status: DC
  Administered 2022-02-03: 100 ug via ORAL
  Filled 2022-02-02: qty 1

## 2022-02-02 MED ORDER — POTASSIUM CHLORIDE CRYS ER 20 MEQ PO TBCR
40.0000 meq | EXTENDED_RELEASE_TABLET | Freq: Two times a day (BID) | ORAL | Status: DC
  Administered 2022-02-02 – 2022-02-03 (×3): 40 meq via ORAL
  Filled 2022-02-02 (×3): qty 2

## 2022-02-02 MED ORDER — VANCOMYCIN HCL 125 MG PO CAPS
125.0000 mg | ORAL_CAPSULE | Freq: Four times a day (QID) | ORAL | Status: DC
  Administered 2022-02-02 – 2022-02-03 (×6): 125 mg via ORAL
  Filled 2022-02-02 (×8): qty 1

## 2022-02-02 NOTE — ED Notes (Signed)
Received verbal report from [place] at this time

## 2022-02-02 NOTE — ED Notes (Signed)
Verbal report given to Aleene Davidson RN at this time
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
## 2022-02-02 NOTE — Progress Notes (Addendum)
Subjective:  The patient reports improved nausea since yesterday. Denies any further lightheadedness or fatigue. Denies chest pain and shortness of breath.  Still without bowel movement though passing gas.  Objective:  Vital signs in last 24 hours: Vitals:   02/02/22 0400 02/02/22 0500 02/02/22 0846 02/02/22 1115  BP: 110/72 115/80 100/62 105/66  Pulse: 82 89 84 87  Resp: _0 Temp:  97.9 F (36.6 C) 98.7 F (37.1 C)   TempSrc:   Oral   SpO2: 96% 100% 100% 100%  Weight:      Height:       Weight change:  No intake or output data in the 24 hours ending 02/02/22 1220  General: Well-appearing, appears to be resting comfortably in ED; No acute distress Cardiovascular: Regular rate and rhythm; no murmurs, rubs, or gallops; 2+ radial pulses Pulmonary: Lungs clear to auscultation bilaterally; no wheezes, rales, or rhonchi  Abdomen: Soft, non-distended, nontender; no masses or organomegaly  Extremities: Status post bilateral AKAs Skin: Warm and dry; no rashes or lesions  Psych: Normal mood and affect   Assessment/Plan:  Principal Problem:   GI bleed  Mallory Ayers is a 51 y.o. female with a past medical history of PE in 2008 and 2021 on Eliquis presenting for hematochezia, vomiting, and loose stools admitted for workup for possible GI bleed.   #C Diff Colitis  #Nausea and vomiting, weight loss #History of gastric sleeve Presented with hematochezia, nausea, and vomitting. CT showing evidence of colitis. Afebrile without leukocytosis.  Hemoglobin is 9.5, stable from 9.4 yesterday. She continues to deny abdominal pain with no further bloody stools. Her C. diff antigen is positive, toxin is negative, and PCR is positive. GI has started vancomycin po. Colonoscopy deferred for now. This raises our suspicion that her GI bleeding is likely due to C. Diff colitis, but will continue to monitor hemoglobin and ensure she is able to tolerate oral antibiotics prior to discharge.   Approval still pending. -GI following, appreciate recommendations - Vancomycin 125 mg PO QID for C. diff - Follow-up GI panel - IV Protonix 40 mg twice daily - Hold Eliquis, aspirin - Clear liquid diet - Ativan 0.5 IV q6 hours prn for nausea given prolonged qtc  - Tigan 200 mg q8 hours prn for nausea - Trend hemoglobin     #Hx of v tach arrest Her potassium is 3.4 today, down from 4.0 yesterday in the setting of poor PO intake. She remains hemodynamically stable.  She is on telemetry as her life vest batteries ran out. Will replete with PO potassium if tolerated. If not, then will give IV.  - PO potassium chloride 40 mEq twice today - Avoid QT prolonging agents - Trend BMP   #Abnormal LFTs Alk phos 276, down from 428 1 month ago.  AST minimally elevated but also improved from last admission.  T. bili 1.5.  Relatively stable from last admission.  Possible sequela from recent cardiac arrest versus NAFLD.  MRI abdomen 6/23 with fatty liver.  INR 2.5.  Hepatic steatosis with small amount of ascites. -Continue to monitor for symptoms of worsening of liver disease   #Hx of PE on Eliquis  Last PE was in 2022, anticoagulated on Eliquis.  -hold Eliquis and consider discontinuing given it has been a year since PE -Hold aspirin   #Hx breast cancer  The patient is s/p mastectomy and had last chemo on 09/2021. Continues with port at left chest.   #Hx of hypothyroidism  Continue home Synthroid   #OSA, not on CPAP -Will discuss CPAP trial with patient   Attestation for Student Documentation:  I personally was present and performed or re-performed the history, physical exam and medical decision-making activities of this service and have verified that the service and findings are accurately documented in the student's note.  Linward Natal, MD 02/02/2022, 12:20 PM    LOS: 1 day   Linward Natal, MD 02/02/2022, 12:20 PM

## 2022-02-03 LAB — BASIC METABOLIC PANEL
Anion gap: 9 (ref 5–15)
BUN: <5 mg/dL — ABNORMAL LOW (ref 6–20)
CO2: 23 mmol/L (ref 22–32)
Calcium: 8.3 mg/dL — ABNORMAL LOW (ref 8.9–10.3)
Chloride: 104 mmol/L (ref 98–111)
Creatinine, Ser: 0.73 mg/dL (ref 0.44–1.00)
GFR, Estimated: >60 mL/min (ref >60)
Glucose, Bld: 83 mg/dL (ref 70–99)
Potassium: 4.2 mmol/L (ref 3.5–5.1)
Sodium: 136 mmol/L (ref 135–145)

## 2022-02-03 LAB — CBC
HCT: 32.2 % — ABNORMAL LOW (ref 36.0–46.0)
Hemoglobin: 10.8 g/dL — ABNORMAL LOW (ref 12.0–15.0)
MCH: 31.5 pg (ref 26.0–34.0)
MCHC: 33.5 g/dL (ref 30.0–36.0)
MCV: 93.9 fL (ref 80.0–100.0)
Platelets: 164 10*3/uL (ref 150–400)
RBC: 3.43 MIL/uL — ABNORMAL LOW (ref 3.87–5.11)
RDW: 16.2 % — ABNORMAL HIGH (ref 11.5–15.5)
WBC: 8.4 10*3/uL (ref 4.0–10.5)
nRBC: 0 % (ref 0.0–0.2)

## 2022-02-03 LAB — PROTIME-INR
INR: 1.7 — ABNORMAL HIGH (ref 0.8–1.2)
Prothrombin Time: 19.5 seconds — ABNORMAL HIGH (ref 11.4–15.2)

## 2022-02-03 LAB — MAGNESIUM: Magnesium: 1.9 mg/dL (ref 1.7–2.4)

## 2022-02-03 MED ORDER — SACCHAROMYCES BOULARDII 250 MG PO CAPS
250.0000 mg | ORAL_CAPSULE | Freq: Two times a day (BID) | ORAL | Status: DC
  Administered 2022-02-03: 250 mg via ORAL
  Filled 2022-02-03 (×2): qty 1

## 2022-02-03 MED ORDER — LORAZEPAM 2 MG/ML IJ SOLN
0.5000 mg | Freq: Four times a day (QID) | INTRAMUSCULAR | Status: DC | PRN
  Administered 2022-02-03: 0.5 mg via INTRAVENOUS
  Filled 2022-02-03: qty 1

## 2022-02-03 MED ORDER — SACCHAROMYCES BOULARDII 250 MG PO CAPS: 250.0000 mg | ORAL_CAPSULE | Freq: Two times a day (BID) | ORAL | 2 refills | Status: AC

## 2022-02-03 MED ORDER — ACETAMINOPHEN 325 MG PO TABS
650.0000 mg | ORAL_TABLET | Freq: Four times a day (QID) | ORAL | Status: DC | PRN
  Administered 2022-02-03: 650 mg via ORAL
  Filled 2022-02-03: qty 2

## 2022-02-03 MED ORDER — TRIMETHOBENZAMIDE HCL 300 MG PO CAPS: 300.0000 mg | ORAL_CAPSULE | Freq: Three times a day (TID) | ORAL | 0 refills | Status: DC | PRN

## 2022-02-03 MED ORDER — VANCOMYCIN HCL 125 MG PO CAPS: 125.0000 mg | ORAL_CAPSULE | Freq: Four times a day (QID) | ORAL | 0 refills | Status: DC

## 2022-02-03 MED ORDER — LORAZEPAM 0.5 MG PO TABS: 0.5000 mg | ORAL_TABLET | Freq: Four times a day (QID) | ORAL | 0 refills | Status: DC | PRN

## 2022-02-03 NOTE — Plan of Care (Signed)
  Problem: Education: Goal: Knowledge of General Education information will improve Description Including pain rating scale, medication(s)/side effects and non-pharmacologic comfort measures Outcome: Progressing   Problem: Health Behavior/Discharge Planning: Goal: Ability to manage health-related needs will improve Outcome: Progressing   
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
## 2022-02-03 NOTE — Discharge Summary (Signed)
Name: Mallory Ayers MRN: [id number] DOB: [birth year] 51 y.o. PCP: [name], MD  Date of Admission: 01/31/2022  5:59 PM Date of Discharge:  02/03/2022 Attending Physician: Dr. Philipp Ovens  DISCHARGE DIAGNOSIS:  Primary Problem: C. difficile colitis   Hospital Problems: Principal Problem:   C. difficile colitis Active Problems:   History of pulmonary embolism   Amputee, above knee - bilateral   History of cardiac arrest   Long QT syndrome   Nausea and vomiting    DISCHARGE MEDICATIONS:   Allergies as of 02/03/2022   No Known Allergies      Medication List     TAKE these medications    aspirin 81 MG chewable tablet Chew 1 tablet (81 mg total) by mouth daily.   Belsomra 20 MG Tabs Generic drug: Suvorexant Take 1 tablet by mouth in the morning.   budesonide-formoterol 160-4.5 MCG/ACT inhaler Commonly known as: SYMBICORT Inhale 2 puffs into the lungs daily as needed (For shortess of breath).   busPIRone 10 MG tablet Commonly known as: BUSPAR Take 10 mg by mouth 2 (two) times daily.   Eliquis 5 MG Tabs tablet Generic drug: apixaban Take 5 mg by mouth 2 (two) times daily.   FLUoxetine HCl 60 MG Tabs Take 60 mg by mouth daily. What changed: when to take this   HYDROmorphone 4 MG tablet Commonly known as: DILAUDID Take 4 mg by mouth daily as needed.   levothyroxine 100 MCG tablet Commonly known as: SYNTHROID Take 100 mcg by mouth daily before breakfast.   LORazepam 0.5 MG tablet Commonly known as: Ativan Take 1 tablet (0.5 mg total) by mouth every 6 (six) hours as needed (nausea).   magnesium oxide 400 MG tablet Commonly known as: MAG-OX Take 1 tablet (400 mg total) by mouth daily.   multivitamins with iron Tabs tablet Take 1 tablet by mouth every morning.   ondansetron 4 MG disintegrating tablet Commonly known as: ZOFRAN-ODT Take 4 mg by mouth every 4 (four) hours as needed for nausea or vomiting.   oxyCODONE 15 MG immediate release  tablet Commonly known as: ROXICODONE Take 15 mg by mouth every 6 (six) hours as needed for pain.   potassium chloride SA 20 MEQ tablet Commonly known as: KLOR-CON M Take 2 tablets (40 mEq total) by mouth 3 (three) times daily. What changed: when to take this   pregabalin 225 MG capsule Commonly known as: LYRICA Take 225 mg by mouth daily.   saccharomyces boulardii 250 MG capsule Commonly known as: FLORASTOR Take 1 capsule (250 mg total) by mouth 2 (two) times daily.   spironolactone 25 MG tablet Commonly known as: ALDACTONE Take 1 tablet (25 mg total) by mouth 2 (two) times daily. What changed: when to take this   trimethobenzamide 300 MG capsule Commonly known as: Tigan Take 1 capsule (300 mg total) by mouth 3 (three) times daily as needed for nausea/vomiting.   vancomycin 125 MG capsule Commonly known as: VANCOCIN Take 1 capsule (125 mg total) by mouth 4 (four) times daily.        DISPOSITION AND FOLLOW-UP:  Ms.Mallory Ayers was discharged from [facility] in Stable condition. At the hospital follow up visit please address:  C. difficile colitis  Nausea and vomiting Ensure patient has completed therapy with vancomycin. Please assist if needed in establishing care with GI for eventual colonoscopy as this was deferred while she was admitted due to active infection and non-emergent nature.   History of ventricular  tachycardia arrest Recheck CMP.   Abnormal LFTs Recheck CMP and PT-INR. Address GI follow up outpatient as above.   Follow-up Recommendations: Consults: Gastroenterology Labs: CBC, Comprehensive Metabolic Panel, and INR Studies: Colonoscopy Medications:   New medications at discharge: Vancomycin 125 four times daily for 10 days total  Florastor 250 mg BID Ativan 0.5 mg q6h PRN nausea Tigan 300 mg q8h PRN nausea No other medication changes  Follow-up Appointments:  Follow-up Information     Hague, Rosalyn Charters, MD. Schedule an  appointment as soon as possible for a visit.   Specialty: Internal Medicine Why: In about 1 week. Contact information: [address] [place] [address] [phone]         [facility]. Schedule an appointment as soon as possible for a visit.   Specialty: Gastroenterology Why: To schedule colonoscopy. Contact information: [place] [id number] [place]:  Patient Summary: C. difficile colitis  Nausea and vomiting Patient presented to [facility] ED complaining of 3 days of bloody stools. She also reported fatigue, feeling lightheaded, and several syncopal episodes. Prior to presenting to [facility] ED she had 1 month of nausea and vomiting, and in the 2 days preceding hospitalization was noticing streaks of pink in her vomit. Initial labs showed Hgb 10.3 and CT abdomen/pelvis showed mild circumferential wall thickening in the ascending colon concerning for colitis. She was afebrile without leukocytosis and denied abdominal pain. She did not have recurrent hematochezia or melena during admission. A GI panel and clostridium difficile test were collected HD1 and she was found to be positive for clostridium difficile. Eliquis and aspirin were held in the setting of possible active GI bleed. Due to her history of ventricular tachycardia arrest secondary to prolonged QTc, she was given ativan and Tigan as needed for nausea which provided relief. Vancomycin was started for clostridium difficile colitis.    History of ventricular tachycardia arrest Patient has recent history of ventricular tachycardia arrest secondary to hypokalemia and wears a life vest. Due to prolonged wait time in the ED, the battery of her life vest died. QTc was prolonged at admission at 529. 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 Potassium and magnesium were supplemented as needed to maintain potassium >4 and  magnesium >2.   Abnormal LFTs Admission labs noted elevated total bilirubin of  1.5, alkaline phosphatase of 276 and minimal elevation in AST. INR was elevated during admission, 2.5>2.1. She has been noted in the past on MRI to have fatty changes to the liver, and CT abdomen/pelvis this admission did show small volume perihepatic and pelvic ascites raising suspicion for underlying cirrhosis. This will need to be addressed further as an outpatient.   History of PE on Eliquis  Eliquis held this admission in setting of GI bleed and supratherapeutic INR.   History of hypothyroidism  Home Synthroid continued.   DISCHARGE INSTRUCTIONS:   Discharge Instructions     Diet - low sodium heart healthy   Complete by: As directed    Discharge instructions   Complete by: As directed    Ms. Mallory Ayers,  It was a pleasure to care for you during your stay at [facility].   When you go home, you will have a few new medicines: 1. Vancomycin 125 mg 4 times daily: You will take your last 2 doses for today once you are home,  then four times daily for 8 more days. 2. Florastor 250 mg 2 times daily 3. Ativan 0.5 mg every 6 hours as needed for nausea 4. Tigan 300 mg every 8 hours as needed for nausea If you do not have nausea, don't take the ativan or Tigan.  I would like you to follow up with your primary care provider in about 1 week.  My best, Dr. Marlou Sa   Increase activity slowly   Complete by: As directed        SUBJECTIVE:  Ms. Mallory Ayers has some lingering nausea but this is well controlled on current therapy. She continues to deny abdominal pain or recurrent bloody stools. She feels okay to go home to finish out antibiotic therapy for colitis. She and her fiance understand to avoid antiemetics that are not those I have sent prescriptions in for due to possible worsening of QTc and cardiac risk. Discharge Vitals:   BP 111/73 (BP Location: Left Arm)   Pulse 89   Temp 97.8 F (36.6 C) (Oral)   Resp 19    Ht '3\' 10"'$  (1.168 m)   Wt 64.7 kg   SpO2 95%   BMI 47.39 kg/m   OBJECTIVE:  Constitutional:Patient appears sleepy, is just waking up. In no acute distress. Cardio:Regular rate and rhythm. No murmurs, rubs, or gallops. Pulm:Clear to auscultation bilaterally. Normal work of breathing on room air. Abdomen:Soft, nontender, nondistended. KGY:JEHUDJSH for extremity edema. Skin:Warm and dry. Neuro:Alert and oriented x3. No focal deficit noted. Psych:Pleasant mood and affect.   Pertinent Labs, Studies, and Procedures:     Latest Ref Rng & Units 02/03/2022    6:30 AM 02/02/2022    4:00 PM 02/02/2022    5:45 AM  CBC  WBC 4.0 - 10.5 K/uL 8.4   7.0   Hemoglobin 12.0 - 15.0 g/dL 10.8  9.7  9.5   Hematocrit 36.0 - 46.0 % 32.2  29.9  27.8   Platelets 150 - 400 K/uL 164   143        Latest Ref Rng & Units 02/03/2022    6:30 AM 02/02/2022    5:45 AM 02/01/2022    3:29 PM  CMP  Glucose 70 - 99 mg/dL 83  90  90   BUN 6 - 20 mg/dL <5  <5  <5   Creatinine 0.44 - 1.00 mg/dL 0.73  0.73  0.69   Sodium 135 - 145 mmol/L 136  137  134   Potassium 3.5 - 5.1 mmol/L 4.2  3.4  4.0   Chloride 98 - 111 mmol/L 104  104  102   CO2 22 - 32 mmol/L '23  23  22   '$ Calcium 8.9 - 10.3 mg/dL 8.3  8.0  7.9   Total Protein 6.5 - 8.1 g/dL   5.5   Total Bilirubin 0.3 - 1.2 mg/dL   1.2   Alkaline Phos 38 - 126 U/L   276   AST 15 - 41 U/L   68   ALT 0 - 44 U/L   16     CT ABDOMEN PELVIS W CONTRAST  Result Date: 02/01/2022 CLINICAL DATA:  Nausea/vomiting, abdominal pain EXAM: CT ABDOMEN AND PELVIS WITH CONTRAST TECHNIQUE: Multidetector CT imaging of the abdomen and pelvis was performed using the standard protocol following bolus administration of intravenous contrast. RADIATION DOSE REDUCTION: This exam was performed according to the departmental dose-optimization program which includes automated exposure control, adjustment of the mA and/or kV according to patient size and/or use of iterative  reconstruction  technique. CONTRAST:  39m OMNIPAQUE IOHEXOL 350 MG/ML SOLN COMPARISON:  12/05/2021 FINDINGS: Lower chest: No pleural or pericardial effusion. Coarse peripheral interstitial markings in the lung bases, right greater than left, improved since previous. Hepatobiliary: Heterogenous parenchyma likely reflecting some fatty infiltration, without discrete lesion or biliary ductal dilatation. Gallbladder not visualized. Pancreas: Unremarkable. No pancreatic ductal dilatation or surrounding inflammatory changes. Spleen: Normal in size without focal abnormality. Adrenals/Urinary Tract: Adrenal glands are unremarkable. Kidneys are normal, without renal calculi, focal lesion, or hydronephrosis. Bladder is unremarkable. Stomach/Bowel: Post gastric sleeve surgery. Stomach and small bowel decompressed. Appendix not discretely identified. Colon is incompletely distended, with mild circumferential wall thickening in the ascending segment. Distal colon unremarkable. Vascular/Lymphatic: Scattered calcified aortoiliac atheromatous plaque without aneurysm or evident stenosis. Portal vein patent. No abdominal or pelvic adenopathy. Reproductive: Uterus and bilateral adnexa are unremarkable. Other: Small volume perihepatic and pelvic ascites.  No free air. Musculoskeletal: Progressive height loss at the subacute presumably unhealed T9 compression fracture deformity, with stable minimal retropulsion. No acute findings. IMPRESSION: 1. Circumferential wall thickening in the ascending colon suggesting colitis. 2. Fatty liver with heterogenous parenchyma. 3. Small volume perihepatic and pelvic ascites. 4. Progressive height loss at the subacute presumably unhealed T9 compression fracture deformity, with stable minimal retropulsion. 5. Aortic atherosclerosis (ICD10-I70.0). Electronically Signed   By: DLucrezia EuropeM.D.   On: 02/01/2022 12:11     Signed: EFarrel Gordon D.O.  Internal Medicine Resident, PGY-1 [facility]   Pager: #[phone] 13432:22 PM, 02/03/2022

## 2022-02-03 NOTE — Plan of Care (Signed)

## 2022-02-03 NOTE — Progress Notes (Signed)
Pt got discharged to home, discharge instructions provided and patient showed understanding to it, IV taken out,Telemonitor DC,pt left unit in wheelchair with all of the belongings accompanied with a family member (Husband) Admiral Marcucci,RN  

## 2022-02-04 LAB — TYPE AND SCREEN
ABO/RH(D): O POS
Antibody Screen: POSITIVE
Donor AG Type: NEGATIVE
Donor AG Type: NEGATIVE
Unit division: 0
Unit division: 0

## 2022-02-04 LAB — BPAM RBC
Blood Product Expiration Date: 202311262359
Blood Product Expiration Date: 202311282359
Unit Type and Rh: 5100
Unit Type and Rh: 5100

## 2022-02-07 ENCOUNTER — Ambulatory Visit (HOSPITAL_BASED_OUTPATIENT_CLINIC_OR_DEPARTMENT_OTHER): Payer: No Typology Code available for payment source | Admitting: Pulmonary Disease

## 2022-02-10 ENCOUNTER — Other Ambulatory Visit: Payer: Self-pay | Admitting: Oncology

## 2022-02-10 DIAGNOSIS — C50411 Malignant neoplasm of upper-outer quadrant of right female breast: Secondary | ICD-10-CM

## 2022-02-10 NOTE — Progress Notes (Signed)
Patient Care Team: [name], MD as PCP - General (Internal Medicine) Vickie Epley, MD as PCP - Electrophysiology (Cardiology) Laurell Roof, RN as Registered Nurse [name], MD as Consulting Physician (Oncology)  Clinic Day:  02/10/2022  Referring physician: No ref. provider found  ASSESSMENT & PLAN:   Assessment & Plan: No problem-specific Assessment & Plan notes found for this encounter.    The patient understands the plans discussed today and is in agreement with them.  She knows to contact our office if she develops concerns prior to her next appointment.    [name], MD  [facility] [address] [place] [address] Dept: [phone] Dept Fax: [phone]   Orders Placed This Encounter  Procedures   CBC with Differential ([place] Only)    Standing Status:   Future    Standing Expiration Date:   02/11/2023   CMP ([place] only)    Standing Status:   Future    Standing Expiration Date:   02/11/2023      CHIEF COMPLAINT:  CC: [name] with history of breast cancer here for hospital follow up  Current Treatment:  Paclitaxel; on hold  INTERVAL HISTORY:  Mallory Ayers is here today for repeat clinical assessment. She denies fevers or chills. She denies pain. Her appetite is good. Her weight has been stable.  I have reviewed the past medical history, past surgical history, social history and family history with the patient and they are unchanged from previous note.  ALLERGIES:  has No Known Allergies.  MEDICATIONS:  Current Outpatient Medications  Medication Sig Dispense Refill   apixaban (ELIQUIS) 5 MG TABS tablet Take 5 mg by mouth 2 (two) times daily.     aspirin 81 MG chewable tablet Chew 1 tablet (81 mg total) by mouth daily. 30 tablet 1   BELSOMRA 20 MG TABS Take 1 tablet by mouth in the morning.     budesonide-formoterol (SYMBICORT) 160-4.5  MCG/ACT inhaler Inhale 2 puffs into the lungs daily as needed (For shortess of breath).     busPIRone (BUSPAR) 10 MG tablet Take 10 mg by mouth 2 (two) times daily.     FLUoxetine HCl 60 MG TABS Take 60 mg by mouth daily. (Patient taking differently: Take 60 mg by mouth at bedtime.) 30 tablet 5   HYDROmorphone (DILAUDID) 4 MG tablet Take 4 mg by mouth daily as needed.     levothyroxine (SYNTHROID) 100 MCG tablet Take 100 mcg by mouth daily before breakfast.     LORazepam (ATIVAN) 0.5 MG tablet Take 1 tablet (0.5 mg total) by mouth every 6 (six) hours as needed (nausea). 20 tablet 0   magnesium oxide (MAG-OX) 400 MG tablet Take 1 tablet (400 mg total) by mouth daily. 90 tablet 3   Multiple Vitamins-Iron (MULTIVITAMINS WITH IRON) TABS tablet Take 1 tablet by mouth every morning.  (Patient not taking: Reported on 02/01/2022)     ondansetron (ZOFRAN-ODT) 4 MG disintegrating tablet Take 4 mg by mouth every 4 (four) hours as needed for nausea or vomiting.     oxyCODONE (ROXICODONE) 15 MG immediate release tablet Take 15 mg by mouth every 6 (six) hours as needed for pain.     potassium chloride SA (KLOR-CON M) 20 MEQ tablet Take 2 tablets (40 mEq total) by mouth 3 (three) times daily. (Patient taking differently: Take 40 mEq by mouth 2 (two) times daily.) 540 tablet 3  pregabalin (LYRICA) 225 MG capsule Take 225 mg by mouth daily.     saccharomyces boulardii (FLORASTOR) 250 MG capsule Take 1 capsule (250 mg total) by mouth 2 (two) times daily. 60 capsule 2   spironolactone (ALDACTONE) 25 MG tablet Take 1 tablet (25 mg total) by mouth 2 (two) times daily. (Patient taking differently: Take 25 mg by mouth daily.) 180 tablet 3   trimethobenzamide (TIGAN) 300 MG capsule Take 1 capsule (300 mg total) by mouth 3 (three) times daily as needed for nausea/vomiting. 15 capsule 0   vancomycin (VANCOCIN) 125 MG capsule Take 1 capsule (125 mg total) by mouth 4 (four) times daily. 34 capsule 0   No current  facility-administered medications for this visit.    HISTORY OF PRESENT ILLNESS:   Oncology History  Malignant neoplasm of upper-outer quadrant of right breast in female, estrogen receptor positive ([place])  05/14/2021 Initial Diagnosis   Breast cancer of upper-outer quadrant of right female breast ([place])   05/25/2021 Cancer Staging   Staging form: Breast, AJCC 8th Edition - Clinical stage from 05/25/2021: Stage IB (cT1c, cN1(sn), cM0, G2, ER+, PR+, HER2-) - Signed by [name], MD on 06/17/2021 Histopathologic type: Lobular carcinoma, NOS Stage prefix: Initial diagnosis Method of lymph node assessment: Sentinel lymph node biopsy Nuclear grade: G2 Histologic grading system: 3 grade system Laterality: Right Tumor size (mm): 20 Lymph-vascular invasion (LVI): LVI not present (absent)/not identified Diagnostic confirmation: Positive histology Specimen type: Excision Staged by: Managing physician Menopausal status: Postmenopausal Ki-67 (%): 20 Stage used in treatment planning: Yes National guidelines used in treatment planning: Yes Type of national guideline used in treatment planning: NCCN   06/26/2021 Imaging   CT chest, abdomen, pelvis  IMPRESSION:  1. Postsurgical change in the RIGHT chest wall consistent with  mastectomy and probable RIGHT axillary nodal dissection. Small fluid  collection in the RIGHT axilla likely represents a postsurgical  seroma.  2. No evidence of lymphadenopathy in the mediastinum.  3. No evidence of pulmonary metastasis. Subpleural reticulation is  favored benign. Branching nodular pattern in the LEFT upper lobe is  favored post infectious or inflammatory.  4. No evidence of metastasis in the abdomen pelvis.  5. No evidence skeletal metastasis.    07/23/2021 - 10/26/2021 Chemotherapy   Patient is on Treatment Plan : Breast AC q21 Days / PACLitaxel q7d     09/19/2021 Imaging   CT abdomen/pelvis  1. Somewhat amorphous hyperenhancing lesion with  overlying capsular  retraction centered in the anterior right lobe of the liver, hepatic  segments V/VIII, measuring 7.3 x 3.1 cm. This appearance has evolved  over multiple sequential prior examinations dating back to  01/06/2020 but is similar in appearance to most recent examination  dated 06/26/2021. This unusual lesion is of uncertain significance  but worrisome in the setting of referable pain and laboratory  abnormalities. Capsular retraction is generally concerning for  malignancy such as cholangiocarcinoma, however apparent enhancement  characteristics are not typical. Morphology is likewise not typical  for metastasis, although this is a definite differential  consideration in the setting of known breast malignancy. Recommend  contrast enhanced MRI of the liver to further evaluate.  2. No other evidence of metastatic disease in the abdomen or pelvis.  3. Underlying hepatic steatosis.  4. Status post sleeve gastrectomy and cholecystectomy.  5. New, although age indeterminate subtle inferior endplate wedge  deformity of the T9 vertebral body, less than 25% anterior height  loss. Correlate for acute point tenderness.  09/30/2021 Genetic Testing   Negative hereditary cancer genetic testing: no pathogenic variants detected in [facility] CancerNext+RNAinsight Panel.  Report date is September 30, 2021.   The CancerNext gene panel offered by [employer] includes sequencing, rearrangement analysis, and RNA analysis for the following 36 genes:   APC, ATM, AXIN2, BARD1, BMPR1A, BRCA1, BRCA2, BRIP1, CDH1, CDK4, CDKN2A, CHEK2, DICER1, HOXB13, EPCAM, GREM1, MLH1, MSH2, MSH3, MSH6, MUTYH, NBN, NF1, NTHL1, PALB2, PMS2, POLD1, POLE, PTEN, RAD51C, RAD51D, RECQL, SMAD4, SMARCA4, STK11, and TP53.        REVIEW OF SYSTEMS:   Constitutional: Denies fevers, chills or abnormal weight loss Eyes: Denies blurriness of vision Ears, nose, mouth, throat, and face: Denies mucositis or sore throat Respiratory:  Denies cough, dyspnea or wheezes Cardiovascular: Denies palpitation, chest discomfort or lower extremity swelling Gastrointestinal:  Denies nausea, heartburn or change in bowel habits Skin: Denies abnormal skin rashes Lymphatics: Denies new lymphadenopathy or easy bruising Neurological:Denies numbness, tingling or new weaknesses Behavioral/Psych: Mood is stable, no new changes  All other systems were reviewed with the patient and are negative.   VITALS:  There were no vitals taken for this visit.  Wt Readings from Last 3 Encounters:  02/02/22 142 lb 10.2 oz (64.7 kg)  01/29/22 142 lb (64.4 kg)  01/14/22 165 lb (74.8 kg)    There is no height or weight on file to calculate BMI.  Performance status (ECOG): 1 - Symptomatic but completely ambulatory  PHYSICAL EXAM:   GENERAL:alert, no distress and comfortable SKIN: skin color, texture, turgor are normal, no rashes or significant lesions EYES: normal, Conjunctiva are pink and non-injected, sclera clear OROPHARYNX:no exudate, no erythema and lips, buccal mucosa, and tongue normal  NECK: supple, thyroid normal size, non-tender, without nodularity LYMPH:  no palpable lymphadenopathy in the cervical, axillary or inguinal LUNGS: clear to auscultation and percussion with normal breathing effort HEART: regular rate & rhythm and no murmurs and no lower extremity edema ABDOMEN:abdomen soft, non-tender and normal bowel sounds Musculoskeletal:no cyanosis of digits and no clubbing  NEURO: alert & oriented x 3 with fluent speech, no focal motor/sensory deficits  LABORATORY DATA:  I have reviewed the data as listed    Component Value Date/Time   NA 136 02/03/2022 0630   NA 139 01/29/2022 1443   K 4.2 02/03/2022 0630   CL 104 02/03/2022 0630   CO2 23 02/03/2022 0630   GLUCOSE 83 02/03/2022 0630   BUN <5 (L) 02/03/2022 0630   BUN 3 (L) 01/29/2022 1443   CREATININE 0.73 02/03/2022 0630   CALCIUM 8.3 (L) 02/03/2022 0630   PROT 5.5 (L)  02/01/2022 1529   PROT 5.5 (A) 11/13/2021 0000   ALBUMIN 2.0 (L) 02/01/2022 1529   AST 68 (H) 02/01/2022 1529   ALT 16 02/01/2022 1529   ALKPHOS 276 (H) 02/01/2022 1529   BILITOT 1.2 02/01/2022 1529   GFRNONAA >60 02/03/2022 0630   GFRAA >90 03/29/2014 0535    No results found for: "SPEP", "UPEP"  Lab Results  Component Value Date   WBC 8.4 02/03/2022   NEUTROABS 8.9 (H) 01/31/2022   HGB 10.8 (L) 02/03/2022   HCT 32.2 (L) 02/03/2022   MCV 93.9 02/03/2022   PLT 164 02/03/2022      Chemistry      Component Value Date/Time   NA 136 02/03/2022 0630   NA 139 01/29/2022 1443   K 4.2 02/03/2022 0630   CL 104 02/03/2022 0630   CO2 23 02/03/2022 0630   BUN <5 (L) 02/03/2022  0630   BUN 3 (L) 01/29/2022 1443   CREATININE 0.73 02/03/2022 0630   GLU 88 11/27/2021 0000      Component Value Date/Time   CALCIUM 8.3 (L) 02/03/2022 0630   ALKPHOS 276 (H) 02/01/2022 1529   AST 68 (H) 02/01/2022 1529   ALT 16 02/01/2022 1529   BILITOT 1.2 02/01/2022 1529       RADIOGRAPHIC STUDIES: I have personally reviewed the radiological images as listed and agreed with the findings in the report. CT ABDOMEN PELVIS W CONTRAST  Result Date: 02/01/2022 CLINICAL DATA:  Nausea/vomiting, abdominal pain EXAM: CT ABDOMEN AND PELVIS WITH CONTRAST TECHNIQUE: Multidetector CT imaging of the abdomen and pelvis was performed using the standard protocol following bolus administration of intravenous contrast. RADIATION DOSE REDUCTION: This exam was performed according to the departmental dose-optimization program which includes automated exposure control, adjustment of the mA and/or kV according to patient size and/or use of iterative reconstruction technique. CONTRAST:  41m OMNIPAQUE IOHEXOL 350 MG/ML SOLN COMPARISON:  12/05/2021 FINDINGS: Lower chest: No pleural or pericardial effusion. Coarse peripheral interstitial markings in the lung bases, right greater than left, improved since previous. Hepatobiliary:  Heterogenous parenchyma likely reflecting some fatty infiltration, without discrete lesion or biliary ductal dilatation. Gallbladder not visualized. Pancreas: Unremarkable. No pancreatic ductal dilatation or surrounding inflammatory changes. Spleen: Normal in size without focal abnormality. Adrenals/Urinary Tract: Adrenal glands are unremarkable. Kidneys are normal, without renal calculi, focal lesion, or hydronephrosis. Bladder is unremarkable. Stomach/Bowel: Post gastric sleeve surgery. Stomach and small bowel decompressed. Appendix not discretely identified. Colon is incompletely distended, with mild circumferential wall thickening in the ascending segment. Distal colon unremarkable. Vascular/Lymphatic: Scattered calcified aortoiliac atheromatous plaque without aneurysm or evident stenosis. Portal vein patent. No abdominal or pelvic adenopathy. Reproductive: Uterus and bilateral adnexa are unremarkable. Other: Small volume perihepatic and pelvic ascites.  No free air. Musculoskeletal: Progressive height loss at the subacute presumably unhealed T9 compression fracture deformity, with stable minimal retropulsion. No acute findings. IMPRESSION: 1. Circumferential wall thickening in the ascending colon suggesting colitis. 2. Fatty liver with heterogenous parenchyma. 3. Small volume perihepatic and pelvic ascites. 4. Progressive height loss at the subacute presumably unhealed T9 compression fracture deformity, with stable minimal retropulsion. 5. Aortic atherosclerosis (ICD10-I70.0). Electronically Signed   By: DLucrezia EuropeM.D.   On: 02/01/2022 12:11   Myocardial Perfusion Imaging  Result Date: 01/15/2022   No ST deviation was noted.   LV perfusion is normal. There is no evidence of ischemia. There is no evidence of infarction.   Left ventricular function is normal. Nuclear stress EF: 64 %. The left ventricular ejection fraction is normal (55-65%). End diastolic cavity size is normal. End systolic cavity size is  normal.   Prior study not available for comparison.   The study is normal. The study is low risk.

## 2022-02-11 ENCOUNTER — Other Ambulatory Visit: Payer: Self-pay | Admitting: Oncology

## 2022-02-11 ENCOUNTER — Encounter: Payer: Self-pay | Admitting: Oncology

## 2022-02-11 ENCOUNTER — Inpatient Hospital Stay: Payer: Medicare HMO | Attending: Oncology | Admitting: Oncology

## 2022-02-11 ENCOUNTER — Inpatient Hospital Stay: Payer: Medicare HMO

## 2022-02-11 VITALS — BP 110/67 | HR 77 | Temp 98.6°F | Resp 18 | Ht <= 58 in | Wt 141.6 lb

## 2022-02-11 DIAGNOSIS — C50411 Malignant neoplasm of upper-outer quadrant of right female breast: Secondary | ICD-10-CM

## 2022-02-11 DIAGNOSIS — Z452 Encounter for adjustment and management of vascular access device: Secondary | ICD-10-CM | POA: Insufficient documentation

## 2022-02-11 DIAGNOSIS — K760 Fatty (change of) liver, not elsewhere classified: Secondary | ICD-10-CM | POA: Diagnosis not present

## 2022-02-11 DIAGNOSIS — Z86711 Personal history of pulmonary embolism: Secondary | ICD-10-CM | POA: Diagnosis not present

## 2022-02-11 DIAGNOSIS — C773 Secondary and unspecified malignant neoplasm of axilla and upper limb lymph nodes: Secondary | ICD-10-CM

## 2022-02-11 DIAGNOSIS — E860 Dehydration: Secondary | ICD-10-CM

## 2022-02-11 DIAGNOSIS — R197 Diarrhea, unspecified: Secondary | ICD-10-CM

## 2022-02-11 DIAGNOSIS — Z170 Estrogen receptor positive status [ER+]: Secondary | ICD-10-CM

## 2022-02-11 DIAGNOSIS — E876 Hypokalemia: Secondary | ICD-10-CM | POA: Diagnosis present

## 2022-02-11 LAB — CBC WITH DIFFERENTIAL (CANCER CENTER ONLY)
Abs Immature Granulocytes: 0.02 10*3/uL (ref 0.00–0.07)
Basophils Absolute: 0 10*3/uL (ref 0.0–0.1)
Basophils Relative: 0 %
Eosinophils Absolute: 0 10*3/uL (ref 0.0–0.5)
Eosinophils Relative: 0 %
HCT: 32.3 % — ABNORMAL LOW (ref 36.0–46.0)
Hemoglobin: 10.9 g/dL — ABNORMAL LOW (ref 12.0–15.0)
Immature Granulocytes: 0 %
Lymphocytes Relative: 5 %
Lymphs Abs: 0.5 10*3/uL — ABNORMAL LOW (ref 0.7–4.0)
MCH: 30.8 pg (ref 26.0–34.0)
MCHC: 33.7 g/dL (ref 30.0–36.0)
MCV: 91.2 fL (ref 80.0–100.0)
Monocytes Absolute: 0.9 10*3/uL (ref 0.1–1.0)
Monocytes Relative: 8 %
Neutro Abs: 9.4 10*3/uL — ABNORMAL HIGH (ref 1.7–7.7)
Neutrophils Relative %: 87 %
Platelet Count: 160 10*3/uL (ref 150–400)
RBC: 3.54 MIL/uL — ABNORMAL LOW (ref 3.87–5.11)
RDW: 16.8 % — ABNORMAL HIGH (ref 11.5–15.5)
WBC Count: 10.9 10*3/uL — ABNORMAL HIGH (ref 4.0–10.5)
nRBC: 0 % (ref 0.0–0.2)

## 2022-02-11 LAB — CMP (CANCER CENTER ONLY)
ALT: 12 U/L (ref 0–44)
AST: 45 U/L — ABNORMAL HIGH (ref 15–41)
Albumin: 2.4 g/dL — ABNORMAL LOW (ref 3.5–5.0)
Alkaline Phosphatase: 240 U/L — ABNORMAL HIGH (ref 38–126)
Anion gap: 10 (ref 5–15)
BUN: <5 mg/dL — ABNORMAL LOW (ref 6–20)
CO2: 24 mmol/L (ref 22–32)
Calcium: 8 mg/dL — ABNORMAL LOW (ref 8.9–10.3)
Chloride: 100 mmol/L (ref 98–111)
Creatinine: 0.71 mg/dL (ref 0.44–1.00)
GFR, Estimated: >60 mL/min (ref >60)
Glucose, Bld: 90 mg/dL (ref 70–99)
Potassium: 4 mmol/L (ref 3.5–5.1)
Sodium: 134 mmol/L — ABNORMAL LOW (ref 135–145)
Total Bilirubin: 1.7 mg/dL — ABNORMAL HIGH (ref 0.3–1.2)
Total Protein: 6 g/dL — ABNORMAL LOW (ref 6.5–8.1)

## 2022-02-11 MED ORDER — HEPARIN SOD (PORK) LOCK FLUSH 100 UNIT/ML IV SOLN
500.0000 [IU] | Freq: Once | INTRAVENOUS | Status: AC | PRN
  Administered 2022-02-11: 500 [IU]

## 2022-02-11 MED ORDER — SODIUM CHLORIDE 0.9% FLUSH
10.0000 mL | INTRAVENOUS | Status: DC | PRN
  Administered 2022-02-11: 10 mL

## 2022-02-11 MED ORDER — TRIMETHOBENZAMIDE HCL 300 MG PO CAPS: 300.0000 mg | ORAL_CAPSULE | Freq: Three times a day (TID) | ORAL | 5 refills | Status: DC | PRN

## 2022-02-11 NOTE — Progress Notes (Signed)
Patient Care Team: [name], MD as PCP - General (Internal Medicine) Vickie Epley, MD as PCP - Electrophysiology (Cardiology) Laurell Roof, RN as Registered Nurse [name], MD as Consulting Physician (Oncology)  Clinic Day:  02/11/22  Referring physician: [name], MD  ASSESSMENT & PLAN:   Assessment & Plan:  Expand All Collapse All     ASSESSMENT & PLAN:    Assessment & Plan: Malignant neoplasm of upper-outer quadrant of right breast in female, estrogen receptor positive ([place]) Stage IB hormone receptor positive breast cancer  diagnosed in February.  She was  teated with mastectomy. Endopredict Epclin score is 4.1 which is considered high risk correlating with a 20% risk of recurrence in the next 10 years with hormonal therapy alone.  She would have an 8% benefit from chemotherapy.  She has been receiving adjuvant chemotherapy with Adriamycin/cyclophosphamide [facility]).  She completed 3 cycles on July 20 and was admitted the following week with severe mucositis, intractable nausea and vomiting, and severe diarrhea with hypokalemia.  Due to this, we decided to stop the [facility] chemotherapy and hopefully proceed with weekly paclitaxel once she recovered.        Abnormal CT of liver/hepatic steatosis CT abdomen and pelvis was done to evaluate elevated transaminases.  This was abnormal revealing and amorphous hyperenhancing lesion with overlying capsular retraction centered in the anterior right lobe of the liver, hepatic segments V/VIII, measuring 7.3 x 3.1 cm. This lesion was not mentioned on previous CT reports. MRI abdomen revealed findings on the recent CT examination reflect heterogeneous hepatic steatosis with focal fatty sparing throughout the central aspect of the liver which also exhibits an associated perfusion anomaly. No definite suspicious hepatic lesion identified at this time. A single repeat abdominal MRI with and without IV gadolinium is recommended in  6 months to ensure the stability of these findings.  Status post cholecystectomy.  Atrial fibrillation/Cardiac arrest She had a cardiac arrest and fortunately her fianc did CPR on August 30 until the EMTs came and she was transported to the hospital.  She was hospitalized until September 26 with atrial fibrillation and multiple complications.  They have recommended that we avoid all lorazepam and ondansetron due to the prolongation of QT interval.  She can use Compazine safely or Tigan but we were unable to find any availability of the Tigan.  She is now scheduled for placement of a defibrillator on November 24 and is wearing a defibrillator belt until then.  C. difficile colitis She had this while in the hospital and it improved but has not resolved completely.  She is on oral vancomycin and continues to have severe nausea which seems very persistent despite improvement of her bowel problems.  She was seen by Lawerance Bach of [facility] and has a follow-up with Nicoletta Ba PA.  She still needs to have an EGD and colonoscopy and evaluation for her chronic constant nausea.  Pulmonary emboli  History of multiple episodes of pulmonary emboli over the years, including 1 earlier this year.  She remains on chronic anticoagulation.  Bilateral lower extremity amputee This was traumatic and she has had bilateral above-the-knee amputations.  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  Anemia This is mild and likely related to all of the above.  Protein calorie malnutrition This is mild.  Hypocalcemia I will recommend oral supplement.  Chronic pain  This is mainly of her abdomen and also phantom pain of her legs.  She is using Dilaudid and  oxycodone for this.   I will check an amylase and lipase in addition to her usual labs to evaluate her chronic nausea but this is more likely related to gastritis or her persistent C. difficile colitis.  We will flush her port today.  I encouraged her to follow-up with  the gastroenterologist as recommended.  Now that she has completed chemotherapy, we need to plan on hormonal therapy.  But she is so stressed out with her current situation that I will wait until she has the defibrillator in place.  In the meantime we will schedule a baseline bone density scan as I plan to use an aromatase inhibitor.  I will see her back in 3 to 4 weeks with repeat CBC and CMP.  We briefly discussed the risks and benefits of hormonal therapy and will plan to start that when she returns.  If she is stable at that time we can see her 2 months later with labs and plan port flush.  Eventually we will go to 30-monthfollow-up.  I will send copies of her labs to her PCP. The patient understands the plans discussed today and is in agreement with them.  She knows to contact our office if she develops concerns prior to her next appointment.    CDerwood Kaplan MD  [id number]: [phone] Fax: [phone]  No orders of the defined types were placed in this encounter.     CHIEF COMPLAINT:  CC: Breast cancer Stage IB, high risk Current Treatment: Plan hormonal therapy  INTERVAL HISTORY:  TAdreenais here today for repeat clinical assessment.Stage IB hormone receptor positive breast cancer  diagnosed in February.  She was  teated with mastectomy. Endopredict Epclin score was 4.1 which is considered high risk correlating with a 20% risk of recurrence in the next 10 years with hormonal therapy alone.  She would have an 8% benefit from chemotherapy.  She received adjuvant chemotherapy with Adriamycin and cyclophosphamide (AC).  She completed 3 cycles on July 20 and was admitted the following week with severe mucositis, intractable nausea and vomiting, and severe diarrhea with hypokalemia.  She was admitted to the hospital after each cycle due to complications.  Due to this, [facility] and had planned to proceed with weekly paclitaxel once she recovered.  However at this time we do not plan any further chemotherapy due to the recent course of events.  We will plan hormonal therapy once she is stabilized.  She had a cardiac arrest and fortunately her fianc did CPR on August 30 until the EMTs came and she was transported to the hospital.  She was hospitalized until September 26 with atrial fibrillation and multiple complications.  They have recommended that we avoid all lorazepam and ondansetron due to the prolongation of QT interval.  She can use Compazine safely or Tigan but we were unable to find any availability of the Tigan.  She is now scheduled for placement of a defibrillator on November 24 and is wearing a defibrillator belt until then.She had this while in the hospital and it improved but has not resolved completely.  She is on oral vancomycin and continues to have severe nausea which seems very persistent despite improvement of her bowel problems.  She was seen by JLawerance Bach[facility] and has a follow-up with ANicoletta BaPA.  She still needs to  have an EGD and colonoscopy and evaluation for her chronic constant nausea.  Today she still seems very anxious and dazed by the whole experience and I think she has some degree of posttraumatic stress disorder.  It is amazing that she still has the use of her mental faculties in view of the fact that she was on CPR for close to 30 minutes.  She is very anxious to have her defibrillator placed so that she does not have another cardiac arrest.  We discussed the fact that I do not plan any further chemotherapy in view of all of the above.  However I do strongly recommend that she have hormonal therapy and would avoid tamoxifen due to the risk of thromboembolic events.  I recommend a baseline bone density scan so that we can use an aromatase inhibitor.  We will plan to discuss this further when she  returns.  Has not been able to eat well due to her chronic severe nausea and describes up to 3 episodes daily of emesis.  The Compazine is not controlling this and so I attempted to order Tigan but this is not available at any local pharmacies.  She does still describe some loose bowels but this is improving.  She has occasional cough but denies dyspnea or chest pain. She denies fevers or chills.  I have reviewed the past medical history, past surgical history, social history and family history with the patient and they are unchanged from previous note.  ALLERGIES:  is allergic to lorazepam and zofran [ondansetron].  MEDICATIONS:  Current Outpatient Medications  Medication Sig Dispense Refill   apixaban (ELIQUIS) 5 MG TABS tablet Take 5 mg by mouth 2 (two) times daily.     aspirin 81 MG chewable tablet Chew 1 tablet (81 mg total) by mouth daily. 30 tablet 1   BELSOMRA 20 MG TABS Take 1 tablet by mouth at bedtime as needed (sleep).     budesonide-formoterol (SYMBICORT) 160-4.5 MCG/ACT inhaler Inhale 2 puffs into the lungs daily as needed (For shortess of breath).     busPIRone (BUSPAR) 10 MG tablet Take 10 mg by mouth 2 (two) times daily.     FLUoxetine HCl 60 MG TABS Take 60 mg by mouth daily. (Patient taking differently: Take 60 mg by mouth at bedtime.) 30 tablet 5   HYDROmorphone (DILAUDID) 4 MG tablet Take 4 mg by mouth every 6 (six) hours as needed for severe pain.     levothyroxine (SYNTHROID) 100 MCG tablet Take 100 mcg by mouth daily before breakfast.     magnesium oxide (MAG-OX) 400 MG tablet Take 1 tablet (400 mg total) by mouth daily. 90 tablet 3   Multiple Vitamins-Iron (MULTIVITAMINS WITH IRON) TABS tablet Take 1 tablet by mouth every morning.     oxyCODONE (ROXICODONE) 15 MG immediate release tablet Take 15 mg by mouth every 6 (six) hours as needed for pain.     potassium chloride SA (KLOR-CON M) 20 MEQ tablet Take 1 tablet (20 mEq total) by mouth 2 (two) times daily.     pregabalin  (LYRICA) 225 MG capsule Take 225 mg by mouth 2 (two) times daily.     promethazine (PHENERGAN) 25 MG tablet Take 25 mg by mouth every 8 (eight) hours as needed for nausea or vomiting.     saccharomyces boulardii (FLORASTOR) 250 MG capsule Take 1 capsule (250 mg total) by mouth 2 (two) times daily. 60 capsule 2   spironolactone (ALDACTONE) 25 MG tablet Take 1 tablet (25  mg total) by mouth daily. 30 tablet 1   Current Facility-Administered Medications  Medication Dose Route Frequency Provider Last Rate Last Admin   sodium chloride flush (NS) 0.9 % injection 10 mL  10 mL Intracatheter PRN [name], MD   10 mL at 02/11/22 1215    HISTORY OF PRESENT ILLNESS:   Oncology History  Malignant neoplasm of upper-outer quadrant of right breast in female, estrogen receptor positive ([place])  05/14/2021 Initial Diagnosis   Breast cancer of upper-outer quadrant of right female breast ([place])   05/25/2021 Cancer Staging   Staging form: Breast, AJCC 8th Edition - Clinical stage from 05/25/2021: Stage IB (cT1c, cN1(sn), cM0, G2, ER+, PR+, HER2-) - Signed by [name], MD on 06/17/2021 Histopathologic type: Lobular carcinoma, NOS Stage prefix: Initial diagnosis Method of lymph node assessment: Sentinel lymph node biopsy Nuclear grade: G2 Histologic grading system: 3 grade system Laterality: Right Tumor size (mm): 20 Lymph-vascular invasion (LVI): LVI not present (absent)/not identified Diagnostic confirmation: Positive histology Specimen type: Excision Staged by: Managing physician Menopausal status: Postmenopausal Ki-67 (%): 20 Stage used in treatment planning: Yes National guidelines used in treatment planning: Yes Type of national guideline used in treatment planning: NCCN   06/26/2021 Imaging   CT chest, abdomen, pelvis  IMPRESSION:  1. Postsurgical change in the RIGHT chest wall consistent with  mastectomy and probable RIGHT axillary nodal dissection. Small fluid  collection  in the RIGHT axilla likely represents a postsurgical  seroma.  2. No evidence of lymphadenopathy in the mediastinum.  3. No evidence of pulmonary metastasis. Subpleural reticulation is  favored benign. Branching nodular pattern in the LEFT upper lobe is  favored post infectious or inflammatory.  4. No evidence of metastasis in the abdomen pelvis.  5. No evidence skeletal metastasis.    07/23/2021 - 10/26/2021 Chemotherapy   Patient is on Treatment Plan : Breast AC q21 Days / PACLitaxel q7d     09/19/2021 Imaging   CT abdomen/pelvis  1. Somewhat amorphous hyperenhancing lesion with overlying capsular  retraction centered in the anterior right lobe of the liver, hepatic  segments V/VIII, measuring 7.3 x 3.1 cm. This appearance has evolved  over multiple sequential prior examinations dating back to  01/06/2020 but is similar in appearance to most recent examination  dated 06/26/2021. This unusual lesion is of uncertain significance  but worrisome in the setting of referable pain and laboratory  abnormalities. Capsular retraction is generally concerning for  malignancy such as cholangiocarcinoma, however apparent enhancement  characteristics are not typical. Morphology is likewise not typical  for metastasis, although this is a definite differential  consideration in the setting of known breast malignancy. Recommend  contrast enhanced MRI of the liver to further evaluate.  2. No other evidence of metastatic disease in the abdomen or pelvis.  3. Underlying hepatic steatosis.  4. Status post sleeve gastrectomy and cholecystectomy.  5. New, although age indeterminate subtle inferior endplate wedge  deformity of the T9 vertebral body, less than 25% anterior height  loss. Correlate for acute point tenderness.    09/30/2021 Genetic Testing   Negative hereditary cancer genetic testing: no pathogenic variants detected in [facility] CancerNext+RNAinsight Panel.  Report date is September 30, 2021.   The  CancerNext gene panel offered by [employer] includes sequencing, rearrangement analysis, and RNA analysis for the following 36 genes:   APC, ATM, AXIN2, BARD1, BMPR1A, BRCA1, BRCA2, BRIP1, CDH1, CDK4, CDKN2A, CHEK2, DICER1, HOXB13, EPCAM, GREM1, MLH1, MSH2, MSH3, MSH6, MUTYH, NBN, NF1, NTHL1,  PALB2, PMS2, POLD1, POLE, PTEN, RAD51C, RAD51D, RECQL, SMAD4, SMARCA4, STK11, and TP53.        REVIEW OF SYSTEMS:   Constitutional: Denies fevers, chills or abnormal weight loss Eyes: Denies blurriness of vision Ears, nose, mouth, throat, and face: Denies mucositis or sore throat Respiratory: Denies cough, dyspnea or wheezes Cardiovascular: Denies palpitation, chest discomfort or lower extremity swelling Gastrointestinal:  Denies nausea, heartburn or change in bowel habits Skin: Denies abnormal skin rashes Lymphatics: Denies new lymphadenopathy or easy bruising Neurological:Denies numbness, tingling or new weaknesses Behavioral/Psych: Mood is stable, no new changes  All other systems were reviewed with the patient and are negative.   VITALS:  Blood pressure 110/67, pulse 77, temperature 98.6 F (37 C), temperature source Oral, resp. rate 18, height _0  (1.168 m), weight 141 lb 9.6 oz (64.2 kg), SpO2 100 %.  Wt Readings from Last 3 Encounters:  03/02/22 149 lb 4 oz (67.7 kg)  02/11/22 141 lb 9.6 oz (64.2 kg)  02/02/22 142 lb 10.2 oz (64.7 kg)    Body mass index is 47.05 kg/m.  Performance status (ECOG): 1 - Symptomatic but completely ambulatory  PHYSICAL EXAM:   GENERAL:alert, no distress and comfortable SKIN: skin color, texture, turgor are normal, no rashes or significant lesions EYES: normal, Conjunctiva are pink and non-injected, sclera clear OROPHARYNX:no exudate, no erythema and lips, buccal mucosa, and tongue normal  NECK: supple, thyroid normal size, non-tender, without nodularity LYMPH:  no palpable lymphadenopathy in the cervical, axillary or inguinal LUNGS: clear to  auscultation and percussion with normal breathing effort HEART: regular rate & rhythm and no murmurs and no lower extremity edema ABDOMEN:abdomen soft, non-tender and normal bowel sounds Musculoskeletal:no cyanosis of digits and no clubbing  NEURO: alert & oriented x 3 with fluent speech, no focal motor/sensory deficits  LABORATORY DATA:  I have reviewed the data as listed    Component Value Date/Time   NA 134 (L) 02/11/2022 1157   NA 139 01/29/2022 1443   K 4.0 02/11/2022 1157   CL 100 02/11/2022 1157   CO2 24 02/11/2022 1157   GLUCOSE 90 02/11/2022 1157   BUN <5 (L) 02/11/2022 1157   BUN 3 (L) 01/29/2022 1443   CREATININE 0.71 02/11/2022 1157   CALCIUM 8.0 (L) 02/11/2022 1157   PROT 6.0 (L) 02/11/2022 1157   PROT 5.5 (A) 11/13/2021 0000   ALBUMIN 2.4 (L) 02/11/2022 1157   AST 45 (H) 02/11/2022 1157   ALT 12 02/11/2022 1157   ALKPHOS 240 (H) 02/11/2022 1157   BILITOT 1.7 (H) 02/11/2022 1157   GFRNONAA >60 02/11/2022 1157   GFRAA >90 03/29/2014 0535    No results found for: "SPEP", "UPEP"  Lab Results  Component Value Date   WBC 10.9 (H) 02/11/2022   NEUTROABS 9.4 (H) 02/11/2022   HGB 10.9 (L) 02/11/2022   HCT 32.3 (L) 02/11/2022   MCV 91.2 02/11/2022   PLT 160 02/11/2022      Chemistry      Component Value Date/Time   NA 134 (L) 02/11/2022 1157   NA 139 01/29/2022 1443   K 4.0 02/11/2022 1157   CL 100 02/11/2022 1157   CO2 24 02/11/2022 1157   BUN <5 (L) 02/11/2022 1157   BUN 3 (L) 01/29/2022 1443   CREATININE 0.71 02/11/2022 1157   GLU 88 11/27/2021 0000      Component Value Date/Time   CALCIUM 8.0 (L) 02/11/2022 1157   ALKPHOS 240 (H) 02/11/2022 1157   AST 45 (H) 02/11/2022  1157   ALT 12 02/11/2022 1157   BILITOT 1.7 (H) 02/11/2022 1157       RADIOGRAPHIC STUDIES: I have personally reviewed the radiological images as listed and agreed with the findings in the report. DG Chest 2 View  Result Date: 03/02/2022 CLINICAL DATA:  Defibrillator  placement. EXAM: CHEST - 2 VIEW COMPARISON:  February 12, 2022. FINDINGS: The heart size and mediastinal contours are within normal limits. Left internal jugular Port-A-Cath is unchanged in position. Interval placement of defibrillator with single lead over cardiac shadow to left of thoracic spine. Minimal bibasilar subsegmental atelectasis is noted. The visualized skeletal structures are unremarkable. IMPRESSION: Interval placement of defibrillator with single lead over cardiac shadow. Minimal bibasilar subsegmental atelectasis. Electronically Signed   By: Marijo Conception M.D.   On: 03/02/2022 08:45   EP PPM/ICD IMPLANT  Result Date: 03/01/2022  CONCLUSIONS:  1.  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 Successful ICD implantation with a [employer] subcutaneous ICD implanted for secondary prevention of sudden death.  3.  No early apparent complications.  4. If her pocket is healing well on examination tomorrow, plan to restart anticoagulation tomorrow evening with close follow-up in the outpatient setting.

## 2022-02-11 NOTE — Progress Notes (Signed)
Face to face with pt and fiance in [place]. Pt has suffered cardiac arrest in recent months and is slowly recovering with much attention being given to her cardiac condition. The pt is here to follow up with Dr. Hinton Rao. Pt is unable to do chemo going forward and will discuss her treatment going  forward with Dr. Hinton Rao today.

## 2022-02-13 DIAGNOSIS — J90 Pleural effusion, not elsewhere classified: Secondary | ICD-10-CM | POA: Diagnosis not present

## 2022-02-13 DIAGNOSIS — R079 Chest pain, unspecified: Secondary | ICD-10-CM | POA: Diagnosis not present

## 2022-02-13 DIAGNOSIS — Z853 Personal history of malignant neoplasm of breast: Secondary | ICD-10-CM | POA: Diagnosis not present

## 2022-02-14 DIAGNOSIS — Z853 Personal history of malignant neoplasm of breast: Secondary | ICD-10-CM | POA: Diagnosis not present

## 2022-02-14 DIAGNOSIS — R079 Chest pain, unspecified: Secondary | ICD-10-CM | POA: Diagnosis not present

## 2022-02-19 ENCOUNTER — Telehealth: Payer: Self-pay

## 2022-02-19 NOTE — Telephone Encounter (Signed)
Patient daughter Mallory Ayers called. Patient daughter states that she is her HCOP. Patient is not eating, in the hospital frequently and is very weak. Losing weight. Patient needs help navigating through her disease. Patient is suppose to get the defibrillator placed on 11/24. Daughter is afraid that patient will be too weak for this or afraid they will not be able to do the procedure. Daughter said we can call her if we have any questions [phone].

## 2022-02-21 ENCOUNTER — Encounter: Payer: Self-pay | Admitting: Hematology and Oncology

## 2022-02-21 NOTE — Telephone Encounter (Signed)
Palliative consult referral sent to [facility] to see if patient qualifies.

## 2022-02-27 NOTE — Pre-Procedure Instructions (Signed)
Attempted to call patient regarding procedure instructions Left voicemail on the following items: Arrival time 1130 Nothing to eat or drink after midnight No meds AM of procedure Responsible person to drive you home and stay with you for 24 hrs Wash with special soap night before and morning of procedure If on anti-coagulant drug instructions Eliquis and ASA last dose today.

## 2022-03-01 ENCOUNTER — Encounter (HOSPITAL_COMMUNITY): Payer: Self-pay | Admitting: Cardiology

## 2022-03-01 ENCOUNTER — Ambulatory Visit (HOSPITAL_COMMUNITY)
Admission: RE | Disposition: A | Discharge status: Home / Self Care | Payer: Self-pay | Source: Home / Self Care | Attending: Cardiology

## 2022-03-01 ENCOUNTER — Ambulatory Visit (HOSPITAL_BASED_OUTPATIENT_CLINIC_OR_DEPARTMENT_OTHER): Payer: Medicare HMO | Admitting: Anesthesiology

## 2022-03-01 ENCOUNTER — Ambulatory Visit (HOSPITAL_COMMUNITY): Payer: Medicare HMO | Admitting: Anesthesiology

## 2022-03-01 ENCOUNTER — Other Ambulatory Visit: Payer: Self-pay

## 2022-03-01 ENCOUNTER — Ambulatory Visit (HOSPITAL_BASED_OUTPATIENT_CLINIC_OR_DEPARTMENT_OTHER)
Admission: RE | Admit: 2022-03-01 | Discharge: 2022-03-02 | Disposition: A | Discharge status: Home / Self Care | Payer: Medicare HMO | Source: Home / Self Care | Attending: Cardiology | Admitting: Cardiology

## 2022-03-01 DIAGNOSIS — I119 Hypertensive heart disease without heart failure: Secondary | ICD-10-CM | POA: Diagnosis present

## 2022-03-01 DIAGNOSIS — Z8674 Personal history of sudden cardiac arrest: Secondary | ICD-10-CM | POA: Insufficient documentation

## 2022-03-01 DIAGNOSIS — R601 Generalized edema: Secondary | ICD-10-CM | POA: Diagnosis present

## 2022-03-01 DIAGNOSIS — E876 Hypokalemia: Secondary | ICD-10-CM | POA: Diagnosis present

## 2022-03-01 DIAGNOSIS — Z853 Personal history of malignant neoplasm of breast: Secondary | ICD-10-CM

## 2022-03-01 DIAGNOSIS — Z9011 Acquired absence of right breast and nipple: Secondary | ICD-10-CM

## 2022-03-01 DIAGNOSIS — Z8719 Personal history of other diseases of the digestive system: Secondary | ICD-10-CM | POA: Insufficient documentation

## 2022-03-01 DIAGNOSIS — Z66 Do not resuscitate: Secondary | ICD-10-CM | POA: Diagnosis not present

## 2022-03-01 DIAGNOSIS — Z9884 Bariatric surgery status: Secondary | ICD-10-CM

## 2022-03-01 DIAGNOSIS — Z8249 Family history of ischemic heart disease and other diseases of the circulatory system: Secondary | ICD-10-CM

## 2022-03-01 DIAGNOSIS — Z7982 Long term (current) use of aspirin: Secondary | ICD-10-CM

## 2022-03-01 DIAGNOSIS — E872 Acidosis, unspecified: Secondary | ICD-10-CM | POA: Diagnosis present

## 2022-03-01 DIAGNOSIS — Z6841 Body Mass Index (BMI) 40.0 and over, adult: Secondary | ICD-10-CM | POA: Insufficient documentation

## 2022-03-01 DIAGNOSIS — Z95828 Presence of other vascular implants and grafts: Secondary | ICD-10-CM

## 2022-03-01 DIAGNOSIS — Z95810 Presence of automatic (implantable) cardiac defibrillator: Secondary | ICD-10-CM | POA: Diagnosis present

## 2022-03-01 DIAGNOSIS — G4733 Obstructive sleep apnea (adult) (pediatric): Secondary | ICD-10-CM | POA: Diagnosis present

## 2022-03-01 DIAGNOSIS — J189 Pneumonia, unspecified organism: Secondary | ICD-10-CM | POA: Diagnosis not present

## 2022-03-01 DIAGNOSIS — K7581 Nonalcoholic steatohepatitis (NASH): Secondary | ICD-10-CM | POA: Diagnosis present

## 2022-03-01 DIAGNOSIS — K219 Gastro-esophageal reflux disease without esophagitis: Secondary | ICD-10-CM | POA: Insufficient documentation

## 2022-03-01 DIAGNOSIS — Z803 Family history of malignant neoplasm of breast: Secondary | ICD-10-CM

## 2022-03-01 DIAGNOSIS — I2510 Atherosclerotic heart disease of native coronary artery without angina pectoris: Secondary | ICD-10-CM | POA: Diagnosis present

## 2022-03-01 DIAGNOSIS — D62 Acute posthemorrhagic anemia: Secondary | ICD-10-CM | POA: Diagnosis not present

## 2022-03-01 DIAGNOSIS — Z87891 Personal history of nicotine dependence: Secondary | ICD-10-CM

## 2022-03-01 DIAGNOSIS — Y95 Nosocomial condition: Secondary | ICD-10-CM | POA: Diagnosis present

## 2022-03-01 DIAGNOSIS — E039 Hypothyroidism, unspecified: Secondary | ICD-10-CM | POA: Diagnosis present

## 2022-03-01 DIAGNOSIS — G894 Chronic pain syndrome: Secondary | ICD-10-CM | POA: Diagnosis present

## 2022-03-01 DIAGNOSIS — I429 Cardiomyopathy, unspecified: Secondary | ICD-10-CM | POA: Insufficient documentation

## 2022-03-01 DIAGNOSIS — K7682 Hepatic encephalopathy: Secondary | ICD-10-CM | POA: Diagnosis not present

## 2022-03-01 DIAGNOSIS — F1120 Opioid dependence, uncomplicated: Secondary | ICD-10-CM | POA: Diagnosis present

## 2022-03-01 DIAGNOSIS — I469 Cardiac arrest, cause unspecified: Secondary | ICD-10-CM | POA: Diagnosis not present

## 2022-03-01 DIAGNOSIS — Z515 Encounter for palliative care: Secondary | ICD-10-CM

## 2022-03-01 DIAGNOSIS — I10 Essential (primary) hypertension: Secondary | ICD-10-CM | POA: Insufficient documentation

## 2022-03-01 DIAGNOSIS — Z89612 Acquired absence of left leg above knee: Secondary | ICD-10-CM

## 2022-03-01 DIAGNOSIS — F32A Depression, unspecified: Secondary | ICD-10-CM | POA: Diagnosis present

## 2022-03-01 DIAGNOSIS — N179 Acute kidney failure, unspecified: Secondary | ICD-10-CM | POA: Diagnosis present

## 2022-03-01 DIAGNOSIS — D638 Anemia in other chronic diseases classified elsewhere: Secondary | ICD-10-CM | POA: Diagnosis present

## 2022-03-01 DIAGNOSIS — K920 Hematemesis: Secondary | ICD-10-CM | POA: Diagnosis not present

## 2022-03-01 DIAGNOSIS — F1020 Alcohol dependence, uncomplicated: Secondary | ICD-10-CM | POA: Diagnosis present

## 2022-03-01 DIAGNOSIS — S12590A Other displaced fracture of sixth cervical vertebra, initial encounter for closed fracture: Secondary | ICD-10-CM | POA: Diagnosis present

## 2022-03-01 DIAGNOSIS — Z79891 Long term (current) use of opiate analgesic: Secondary | ICD-10-CM

## 2022-03-01 DIAGNOSIS — S2232XA Fracture of one rib, left side, initial encounter for closed fracture: Secondary | ICD-10-CM | POA: Diagnosis present

## 2022-03-01 DIAGNOSIS — S2222XA Fracture of body of sternum, initial encounter for closed fracture: Secondary | ICD-10-CM | POA: Diagnosis present

## 2022-03-01 DIAGNOSIS — I472 Ventricular tachycardia, unspecified: Secondary | ICD-10-CM | POA: Diagnosis present

## 2022-03-01 DIAGNOSIS — J449 Chronic obstructive pulmonary disease, unspecified: Secondary | ICD-10-CM | POA: Insufficient documentation

## 2022-03-01 DIAGNOSIS — Z7951 Long term (current) use of inhaled steroids: Secondary | ICD-10-CM

## 2022-03-01 DIAGNOSIS — Y92019 Unspecified place in single-family (private) house as the place of occurrence of the external cause: Secondary | ICD-10-CM

## 2022-03-01 DIAGNOSIS — J9601 Acute respiratory failure with hypoxia: Secondary | ICD-10-CM | POA: Diagnosis not present

## 2022-03-01 DIAGNOSIS — Z1152 Encounter for screening for COVID-19: Secondary | ICD-10-CM

## 2022-03-01 DIAGNOSIS — Z7901 Long term (current) use of anticoagulants: Secondary | ICD-10-CM

## 2022-03-01 DIAGNOSIS — Z807 Family history of other malignant neoplasms of lymphoid, hematopoietic and related tissues: Secondary | ICD-10-CM

## 2022-03-01 DIAGNOSIS — R627 Adult failure to thrive: Secondary | ICD-10-CM | POA: Diagnosis present

## 2022-03-01 DIAGNOSIS — I4581 Long QT syndrome: Secondary | ICD-10-CM | POA: Diagnosis present

## 2022-03-01 DIAGNOSIS — Z79899 Other long term (current) drug therapy: Secondary | ICD-10-CM

## 2022-03-01 DIAGNOSIS — Z888 Allergy status to other drugs, medicaments and biological substances status: Secondary | ICD-10-CM

## 2022-03-01 DIAGNOSIS — S12490A Other displaced fracture of fifth cervical vertebra, initial encounter for closed fracture: Secondary | ICD-10-CM | POA: Diagnosis present

## 2022-03-01 DIAGNOSIS — G4730 Sleep apnea, unspecified: Secondary | ICD-10-CM | POA: Insufficient documentation

## 2022-03-01 DIAGNOSIS — Z89611 Acquired absence of right leg above knee: Secondary | ICD-10-CM

## 2022-03-01 DIAGNOSIS — R188 Other ascites: Secondary | ICD-10-CM | POA: Diagnosis present

## 2022-03-01 DIAGNOSIS — Z9221 Personal history of antineoplastic chemotherapy: Secondary | ICD-10-CM

## 2022-03-01 DIAGNOSIS — Z86711 Personal history of pulmonary embolism: Secondary | ICD-10-CM

## 2022-03-01 DIAGNOSIS — R278 Other lack of coordination: Secondary | ICD-10-CM | POA: Diagnosis not present

## 2022-03-01 DIAGNOSIS — Z8619 Personal history of other infectious and parasitic diseases: Secondary | ICD-10-CM

## 2022-03-01 DIAGNOSIS — J690 Pneumonitis due to inhalation of food and vomit: Secondary | ICD-10-CM | POA: Diagnosis not present

## 2022-03-01 DIAGNOSIS — S12690A Other displaced fracture of seventh cervical vertebra, initial encounter for closed fracture: Secondary | ICD-10-CM | POA: Diagnosis present

## 2022-03-01 DIAGNOSIS — W050XXA Fall from non-moving wheelchair, initial encounter: Secondary | ICD-10-CM | POA: Diagnosis present

## 2022-03-01 DIAGNOSIS — M4854XA Collapsed vertebra, not elsewhere classified, thoracic region, initial encounter for fracture: Secondary | ICD-10-CM | POA: Diagnosis present

## 2022-03-01 DIAGNOSIS — G546 Phantom limb syndrome with pain: Secondary | ICD-10-CM | POA: Diagnosis present

## 2022-03-01 DIAGNOSIS — Z993 Dependence on wheelchair: Secondary | ICD-10-CM

## 2022-03-01 DIAGNOSIS — I4901 Ventricular fibrillation: Secondary | ICD-10-CM | POA: Diagnosis present

## 2022-03-01 DIAGNOSIS — Z79890 Hormone replacement therapy (postmenopausal): Secondary | ICD-10-CM

## 2022-03-01 DIAGNOSIS — E871 Hypo-osmolality and hyponatremia: Secondary | ICD-10-CM | POA: Diagnosis present

## 2022-03-01 HISTORY — PX: SUBQ ICD IMPLANT: EP1223

## 2022-03-01 SURGERY — SUBQ ICD IMPLANT
Anesthesia: General

## 2022-03-01 MED ORDER — CHLORHEXIDINE GLUCONATE 4 % EX LIQD
4.0000 | Freq: Once | CUTANEOUS | Status: DC
  Filled 2022-03-01: qty 60

## 2022-03-01 MED ORDER — SODIUM CHLORIDE 0.9% FLUSH: 10.0000 mL | Freq: Two times a day (BID) | INTRAVENOUS | Status: DC

## 2022-03-01 MED ORDER — PHENYLEPHRINE 80 MCG/ML (10ML) SYRINGE FOR IV PUSH (FOR BLOOD PRESSURE SUPPORT)
PREFILLED_SYRINGE | INTRAVENOUS | Status: DC | PRN
  Administered 2022-03-01: 160 ug via INTRAVENOUS

## 2022-03-01 MED ORDER — LIDOCAINE 2% (20 MG/ML) 5 ML SYRINGE
INTRAMUSCULAR | Status: DC | PRN
  Administered 2022-03-01: 60 mg via INTRAVENOUS

## 2022-03-01 MED ORDER — PREGABALIN 75 MG PO CAPS
225.0000 mg | ORAL_CAPSULE | Freq: Two times a day (BID) | ORAL | Status: DC
  Administered 2022-03-02: 225 mg via ORAL
  Filled 2022-03-01: qty 3

## 2022-03-01 MED ORDER — MAGNESIUM OXIDE -MG SUPPLEMENT 400 (240 MG) MG PO TABS
400.0000 mg | ORAL_TABLET | Freq: Every day | ORAL | Status: DC
  Administered 2022-03-01 – 2022-03-02 (×2): 400 mg via ORAL
  Filled 2022-03-01 (×2): qty 1

## 2022-03-01 MED ORDER — OXYCODONE HCL 5 MG PO TABS: 10.0000 mg | ORAL_TABLET | Freq: Four times a day (QID) | ORAL | Status: DC | PRN

## 2022-03-01 MED ORDER — SODIUM CHLORIDE 0.9 % IV SOLN: INTRAVENOUS | Status: DC

## 2022-03-01 MED ORDER — CEFAZOLIN IN SODIUM CHLORIDE 3-0.9 GM/100ML-% IV SOLN
3.0000 g | INTRAVENOUS | Status: AC
  Administered 2022-03-01: 3 g via INTRAVENOUS
  Filled 2022-03-01: qty 100

## 2022-03-01 MED ORDER — OXYCODONE HCL 5 MG PO TABS: 15.0000 mg | ORAL_TABLET | Freq: Four times a day (QID) | ORAL | Status: DC | PRN

## 2022-03-01 MED ORDER — HEPARIN (PORCINE) IN NACL 1000-0.9 UT/500ML-% IV SOLN
INTRAVENOUS | Status: AC
  Filled 2022-03-01: qty 500

## 2022-03-01 MED ORDER — ONDANSETRON HCL 4 MG/2ML IJ SOLN
INTRAMUSCULAR | Status: DC | PRN
  Administered 2022-03-01: 4 mg via INTRAVENOUS

## 2022-03-01 MED ORDER — HYDROMORPHONE HCL 2 MG PO TABS
4.0000 mg | ORAL_TABLET | Freq: Four times a day (QID) | ORAL | Status: DC | PRN
  Administered 2022-03-01 – 2022-03-02 (×2): 4 mg via ORAL
  Filled 2022-03-01 (×2): qty 2

## 2022-03-01 MED ORDER — HEPARIN (PORCINE) IN NACL 1000-0.9 UT/500ML-% IV SOLN
INTRAVENOUS | Status: DC | PRN
  Administered 2022-03-01: 500 mL

## 2022-03-01 MED ORDER — CHLORHEXIDINE GLUCONATE CLOTH 2 % EX PADS
6.0000 | MEDICATED_PAD | Freq: Every day | CUTANEOUS | Status: DC
  Administered 2022-03-01 – 2022-03-02 (×2): 6 via TOPICAL

## 2022-03-01 MED ORDER — DEXAMETHASONE SODIUM PHOSPHATE 10 MG/ML IJ SOLN
INTRAMUSCULAR | Status: DC | PRN
  Administered 2022-03-01: 10 mg via INTRAVENOUS

## 2022-03-01 MED ORDER — BUPIVACAINE HCL (PF) 0.25 % IJ SOLN
INTRAMUSCULAR | Status: AC
  Filled 2022-03-01: qty 60

## 2022-03-01 MED ORDER — ROCURONIUM BROMIDE 10 MG/ML (PF) SYRINGE
PREFILLED_SYRINGE | INTRAVENOUS | Status: DC | PRN
  Administered 2022-03-01: 50 mg via INTRAVENOUS

## 2022-03-01 MED ORDER — BUSPIRONE HCL 10 MG PO TABS
10.0000 mg | ORAL_TABLET | Freq: Two times a day (BID) | ORAL | Status: DC
  Administered 2022-03-01 – 2022-03-02 (×2): 10 mg via ORAL
  Filled 2022-03-01 (×2): qty 1

## 2022-03-01 MED ORDER — SODIUM CHLORIDE 0.9 % IV SOLN: 80.0000 mg | INTRAVENOUS | Status: DC

## 2022-03-01 MED ORDER — BUPIVACAINE HCL (PF) 0.25 % IJ SOLN
INTRAMUSCULAR | Status: DC | PRN
  Administered 2022-03-01: 50 mL

## 2022-03-01 MED ORDER — SODIUM CHLORIDE 0.9 % IV SOLN
INTRAVENOUS | Status: AC
  Filled 2022-03-01: qty 2

## 2022-03-01 MED ORDER — POVIDONE-IODINE 10 % EX SWAB: 2.0000 | Freq: Once | CUTANEOUS | Status: DC

## 2022-03-01 MED ORDER — POTASSIUM CHLORIDE CRYS ER 20 MEQ PO TBCR
20.0000 meq | EXTENDED_RELEASE_TABLET | Freq: Two times a day (BID) | ORAL | Status: DC
  Administered 2022-03-01 – 2022-03-02 (×2): 20 meq via ORAL
  Filled 2022-03-01 (×2): qty 1

## 2022-03-01 MED ORDER — SODIUM CHLORIDE 0.9% FLUSH
10.0000 mL | INTRAVENOUS | Status: DC | PRN
  Administered 2022-03-02: 10 mL

## 2022-03-01 MED ORDER — FENTANYL CITRATE (PF) 250 MCG/5ML IJ SOLN
INTRAMUSCULAR | Status: DC | PRN
  Administered 2022-03-01 (×4): 50 ug via INTRAVENOUS

## 2022-03-01 MED ORDER — OXYCODONE HCL 5 MG PO TABS
10.0000 mg | ORAL_TABLET | Freq: Four times a day (QID) | ORAL | Status: DC | PRN
  Administered 2022-03-01: 10 mg via ORAL
  Filled 2022-03-01: qty 2

## 2022-03-01 MED ORDER — MOMETASONE FURO-FORMOTEROL FUM 200-5 MCG/ACT IN AERO
2.0000 | INHALATION_SPRAY | Freq: Two times a day (BID) | RESPIRATORY_TRACT | Status: DC
  Administered 2022-03-02: 2 via RESPIRATORY_TRACT
  Filled 2022-03-01: qty 8.8

## 2022-03-01 MED ORDER — SPIRONOLACTONE 25 MG PO TABS
25.0000 mg | ORAL_TABLET | Freq: Every day | ORAL | Status: DC
  Administered 2022-03-02: 25 mg via ORAL
  Filled 2022-03-01: qty 1

## 2022-03-01 MED ORDER — FLUOXETINE HCL 20 MG PO CAPS
60.0000 mg | ORAL_CAPSULE | Freq: Every evening | ORAL | Status: DC
  Administered 2022-03-01: 60 mg via ORAL
  Filled 2022-03-01: qty 3

## 2022-03-01 MED ORDER — ACETAMINOPHEN 500 MG PO TABS: 1000.0000 mg | ORAL_TABLET | Freq: Once | ORAL | Status: DC

## 2022-03-01 MED ORDER — SUVOREXANT 20 MG PO TABS: 1.0000 | ORAL_TABLET | Freq: Every evening | ORAL | Status: DC | PRN

## 2022-03-01 MED ORDER — ACETAMINOPHEN 325 MG PO TABS
325.0000 mg | ORAL_TABLET | ORAL | Status: DC | PRN
  Administered 2022-03-01: 650 mg via ORAL
  Filled 2022-03-01: qty 2

## 2022-03-01 MED ORDER — PROPOFOL 10 MG/ML IV BOLUS
INTRAVENOUS | Status: DC | PRN
  Administered 2022-03-01: 120 mg via INTRAVENOUS

## 2022-03-01 MED ORDER — LEVOTHYROXINE SODIUM 100 MCG PO TABS
100.0000 ug | ORAL_TABLET | Freq: Every day | ORAL | Status: DC
  Administered 2022-03-02: 100 ug via ORAL
  Filled 2022-03-01: qty 1

## 2022-03-01 SURGICAL SUPPLY — 6 items
CABLE SURGICAL S-101-97-12 (CABLE) ×2 IMPLANT
ICD SUBCU MRI EMBLEM A219 (ICD Generator) IMPLANT
LEAD SUBQU EMBLEM 3501 (Pacemaker) IMPLANT
PAD DEFIB RADIO PHYSIO CONN (PAD) ×2 IMPLANT
SHEATH PROBE COVER 6X72 (BAG) IMPLANT
TRAY PACEMAKER INSERTION (PACKS) ×2 IMPLANT

## 2022-03-01 NOTE — Transfer of Care (Signed)
Immediate Anesthesia Transfer of Care Note  Patient: Mallory Ayers  Procedure(s) Performed: SUBQ ICD IMPLANT  Patient Location: PACU  Anesthesia Type:General  Level of Consciousness: awake, alert , and oriented  Airway & Oxygen Therapy: Patient Spontanous Breathing and Patient connected to nasal cannula oxygen  Post-op Assessment: Report given to RN and Post -op Vital signs reviewed and stable  Post vital signs: Reviewed and stable  Last Vitals:  Vitals Value Taken Time  BP 103/65 03/01/22 1521  Temp    Pulse 100 03/01/22 1522  Resp 19 03/01/22 1522  SpO2 100 % 03/01/22 1522  Vitals shown include unvalidated device data.  Last Pain:  Vitals:   03/01/22 1517  TempSrc:   PainSc: 5       Patients Stated Pain Goal: 1 (91/50/41 3643)  Complications: No notable events documented.
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
## 2022-03-01 NOTE — Anesthesia Procedure Notes (Signed)
Procedure Name: Intubation Date/Time: 03/01/2022 1:15 PM  Performed by: Minerva Ends, CRNAPre-anesthesia Checklist: Patient identified, Emergency Drugs available, Suction available and Patient being monitored Patient Re-evaluated:Patient Re-evaluated prior to induction Oxygen Delivery Method: Circle system utilized Preoxygenation: Pre-oxygenation with 100% oxygen Induction Type: IV induction Ventilation: Mask ventilation without difficulty Laryngoscope Size: Mac and 3 Grade View: Grade I Tube type: Oral Tube size: 7.0 mm Number of attempts: 1 Airway Equipment and Method: Stylet and Oral airway Placement Confirmation: ETT inserted through vocal cords under direct vision, positive ETCO2 and breath sounds checked- equal and bilateral Secured at: 21 cm Tube secured with: Tape Dental Injury: Teeth and Oropharynx as per pre-operative assessment

## 2022-03-01 NOTE — Discharge Instructions (Addendum)
     Supplemental Discharge Instructions for  Pacemaker/Defibrillator Patients  Activity No heavy lifting or vigorous activity with your left arm, do not raise your left arm above your head until wounds are completely healed.    NO DRIVING until your current restriction is completed .  WOUND CARE Keep the wound area clean and dry.  Do not get this area wet , no showers for one week; you may shower on  03/09/22  . The tape/steri-strips on your wound will fall off; do not pull them off.  No bandage is needed on the site.  DO  NOT apply any creams, oils, or ointments to the wound area. If you notice any drainage or discharge from the wound, any swelling or bruising at the site, or you develop a fever > 101? F after you are discharged home, call the office at once.  Special Instructions You are still able to use cellular telephones; use the ear opposite the side where you have your pacemaker/defibrillator.  Avoid carrying your cellular phone near your device. When traveling through airports, show security personnel your identification card to avoid being screened in the metal detectors.  Ask the security personnel to use the hand wand. Avoid arc welding equipment, MRI testing (magnetic resonance imaging), TENS units (transcutaneous nerve stimulators).  Call the office for questions about other devices. Avoid electrical appliances that are in poor condition or are not properly grounded. Microwave ovens are safe to be near or to operate.  Additional information for defibrillator patients should your device go off: If your device goes off ONCE and you feel fine afterward, notify the device clinic nurses. If your device goes off ONCE and you do not feel well afterward, call 911. If your device goes off TWICE, call 911. If your device goes off THREE times in one day, call 911.  DO NOT DRIVE YOURSELF OR A FAMILY MEMBER WITH A DEFIBRILLATOR TO THE HOSPITAL--CALL 911.

## 2022-03-01 NOTE — Progress Notes (Signed)
Dr stated it was ok to access port o cath

## 2022-03-01 NOTE — Anesthesia Preprocedure Evaluation (Addendum)
Anesthesia Evaluation  Patient identified by MRN, date of birth, ID band Patient awake    Reviewed: Allergy & Precautions, NPO status , Patient's Chart, lab work & pertinent test results  History of Anesthesia Complications Negative for: history of anesthetic complications  Airway Mallampati: III  TM Distance: <3 FB Neck ROM: Full    Dental  (+) Dental Advisory Given   Pulmonary sleep apnea , COPD,  COPD inhaler, former smoker   Pulmonary exam normal        Cardiovascular hypertension, Pt. on medications Normal cardiovascular exam   '23 Myoperfusion -   LV perfusion is normal. There is no evidence of ischemia. There is no evidence of infarction.   Left ventricular function is normal. Nuclear stress EF: 64 %. The left ventricular ejection fraction is normal (55-65%). End diastolic cavity size is normal. End systolic cavity size is normal.   Prior study not available for comparison.   The study is normal. The study is low risk.    Neuro/Psych  PSYCHIATRIC DISORDERS Anxiety Depression    negative neurological ROS     GI/Hepatic hiatal hernia,GERD  ,,  Endo/Other  diabetesHypothyroidism    Renal/GU negative Renal ROS     Musculoskeletal negative musculoskeletal ROS (+)  narcotic dependent  Abdominal   Peds  Hematology  On eliquis    Anesthesia Other Findings   Reproductive/Obstetrics                              Anesthesia Physical Anesthesia Plan  ASA: 3  Anesthesia Plan: General   Post-op Pain Management: Tylenol PO (pre-op)* and Minimal or no pain anticipated   Induction: Intravenous  PONV Risk Score and Plan: 3 and Treatment may vary due to age or medical condition, Dexamethasone and Midazolam  Airway Management Planned: LMA  Additional Equipment: None  Intra-op Plan:   Post-operative Plan: Extubation in OR  Informed Consent: I have reviewed the patients History and  Physical, chart, labs and discussed the procedure including the risks, benefits and alternatives for the proposed anesthesia with the patient or authorized representative who has indicated his/her understanding and acceptance.     Dental advisory given  Plan Discussed with: CRNA and Anesthesiologist  Anesthesia Plan Comments:         Anesthesia Quick Evaluation

## 2022-03-01 NOTE — Anesthesia Postprocedure Evaluation (Signed)
Anesthesia Post Note  Patient: Mallory Ayers  Procedure(s) Performed: SUBQ ICD IMPLANT     Patient location during evaluation: PACU Anesthesia Type: General Level of consciousness: awake and alert Pain management: pain level controlled Vital Signs Assessment: post-procedure vital signs reviewed and stable Respiratory status: spontaneous breathing, nonlabored ventilation and respiratory function stable Cardiovascular status: stable and blood pressure returned to baseline Anesthetic complications: no   No notable events documented.  Last Vitals:  Vitals:   03/01/22 1535 03/01/22 1558  BP: 108/62   Pulse: (!) 102 99  Resp: 18 18  Temp: 36.8 C 36.6 C  SpO2: 100% 100%    Last Pain:  Vitals:   03/01/22 1558  TempSrc: Oral  PainSc: [place]

## 2022-03-02 ENCOUNTER — Ambulatory Visit (HOSPITAL_COMMUNITY): Payer: Medicare HMO

## 2022-03-02 DIAGNOSIS — Z95810 Presence of automatic (implantable) cardiac defibrillator: Secondary | ICD-10-CM | POA: Diagnosis not present

## 2022-03-02 MED ORDER — POTASSIUM CHLORIDE CRYS ER 20 MEQ PO TBCR: 20.0000 meq | EXTENDED_RELEASE_TABLET | Freq: Two times a day (BID) | ORAL | Status: AC

## 2022-03-02 MED ORDER — SPIRONOLACTONE 25 MG PO TABS: 25.0000 mg | ORAL_TABLET | Freq: Every day | ORAL | Status: DC

## 2022-03-02 MED ORDER — SPIRONOLACTONE 25 MG PO TABS: 25.0000 mg | ORAL_TABLET | Freq: Every day | ORAL | 1 refills | Status: AC

## 2022-03-02 MED ORDER — HEPARIN SOD (PORK) LOCK FLUSH 100 UNIT/ML IV SOLN
500.0000 [IU] | INTRAVENOUS | Status: AC | PRN
  Administered 2022-03-02: 500 [IU]

## 2022-03-02 NOTE — Progress Notes (Signed)
Rounding Note    Patient Name: Mallory Ayers Date of Encounter: 03/02/2022  [place] Cardiologist: Quentin Ore  Subjective   Patient post S ICD implant.  Discomfort at implant site, but no other complaints.  Inpatient Medications    Scheduled Meds:  busPIRone  10 mg Oral BID   Chlorhexidine Gluconate Cloth  6 each Topical Daily   FLUoxetine  60 mg Oral Nightly   levothyroxine  100 mcg Oral QAC breakfast   magnesium oxide  400 mg Oral Daily   mometasone-formoterol  2 puff Inhalation BID   potassium chloride SA  20 mEq Oral BID   pregabalin  225 mg Oral BID   sodium chloride flush  10-40 mL Intracatheter Q12H   spironolactone  25 mg Oral Daily   Continuous Infusions:  PRN Meds: acetaminophen, HYDROmorphone, oxyCODONE, sodium chloride flush, Suvorexant   Vital Signs    Vitals:   03/01/22 1558 03/01/22 2004 03/02/22 0004 03/02/22 0504  BP:  111/82 (!) 118/90 102/75  Pulse: 99 99 (!) 102 84  Resp: '18 18 18 18  '$ Temp: 97.8 F (36.6 C) (!) 97.5 F (36.4 C) 98.1 F (36.7 C) 98.1 F (36.7 C)  TempSrc: Oral Oral Oral Oral  SpO2: 100% 99% 96% 93%  Weight: 67 kg   67.7 kg  Height:        Intake/Output Summary (Last 24 hours) at 03/02/2022 0757 Last data filed at 03/01/2022 1451 Gross per 24 hour  Intake 600 ml  Output --  Net 600 ml      03/02/2022    5:04 AM 03/01/2022    3:58 PM 03/01/2022   12:10 PM  Last 3 Weights  Weight (lbs) 149 lb 4 oz 147 lb 11.3 oz 140 lb  Weight (kg) 67.7 kg 67 kg 63.504 kg      Telemetry    Sinus rhythm - Personally Reviewed  ECG    Sinus rhythm - Personally Reviewed  Physical Exam   GEN: Well nourished, well developed, in no acute distress  HEENT: normal  Neck: no JVD, carotid bruits, or masses Cardiac: RRR; no murmurs, rubs, or gallops,no edema  Respiratory:  clear to auscultation bilaterally, normal work of breathing GI: soft, nontender, nondistended, + BS MS: no deformity or atrophy  Skin: warm and dry,  device site well healed Neuro:  Strength and sensation are intact Psych: euthymic mood, full affect   Labs    High Sensitivity Troponin:  No results for input(s): "TROPONINIHS" in the last 720 hours.   ChemistryNo results for input(s): "NA", "K", "CL", "CO2", "GLUCOSE", "BUN", "CREATININE", "CALCIUM", "MG", "PROT", "ALBUMIN", "AST", "ALT", "ALKPHOS", "BILITOT", "GFRNONAA", "GFRAA", "ANIONGAP" in the last 168 hours.  Lipids No results for input(s): "CHOL", "TRIG", "HDL", "LABVLDL", "LDLCALC", "CHOLHDL" in the last 168 hours.  HematologyNo results for input(s): "WBC", "RBC", "HGB", "HCT", "MCV", "MCH", "MCHC", "RDW", "PLT" in the last 168 hours. Thyroid No results for input(s): "TSH", "FREET4" in the last 168 hours.  BNPNo results for input(s): "BNP", "PROBNP" in the last 168 hours.  DDimer No results for input(s): "DDIMER" in the last 168 hours.   Radiology    EP PPM/ICD IMPLANT  Result Date: 03/01/2022  CONCLUSIONS:  1.  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 Successful ICD implantation with a [employer] subcutaneous ICD implanted for secondary prevention of sudden death.  3.  No early apparent complications.  4. If her pocket is healing  well on examination tomorrow, plan to restart anticoagulation tomorrow evening with close follow-up in the outpatient setting.    Cardiac Studies     Patient Profile     51 y.o. female history of cardiac arrest post S ICD implant  Assessment & Plan    Cardiac arrest Polymorphic VT Morbid obesity  Patient is status post [place] ICD implant.  Device functioning appropriately.  We Chantilly Linskey plan for discharge today with follow-up in device clinic.  For questions or updates, please contact [place] Please consult [email or web address] for contact info under        Signed, Julien Oscar Meredith Leeds, MD  03/02/2022, 7:57 AM

## 2022-03-02 NOTE — Plan of Care (Signed)

## 2022-03-02 NOTE — Discharge Summary (Addendum)
Discharge Summary    Patient ID: Mallory Ayers MRN: [id number]; DOB: Aug 15, 1970  Admit date: 03/01/2022 Discharge date: 03/02/2022  PCP:  [name], [place] Prov[facility] Cardiologist:  None  Electrophysiologist:  Vickie Epley, MD       Discharge Diagnoses    Principal Problem:   ICD (implantable cardioverter-defibrillator) in place    Diagnostic Studies/Procedures     CONCLUSIONS:  1. 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 Successful ICD implantation with a [employer] subcutaneous ICD implanted for secondary prevention of sudden death.  3. No early apparent complications.  4. If her pocket is healing well on examination tomorrow, plan to restart anticoagulation tomorrow evening with close follow-up in the outpatient setting.   ICD lead (emblem S ICD, serial [id number])  Pulse generator (serial [id number])   _____________   History of Present Illness     Mallory Ayers is a 51 y.o. 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  Given the waxing and waning mental status, the decision was made to defer S ICD implant and bridge her with a LifeVest.  Patient was seen recently on 01/01/2022 at which time she was doing well. No shortness of breath or chest pain. Was doing okay with the LifeVest.   Today, the patient states that she is doing okay but is happy to be getting rid of the life vest.   She had cellulitis on her left arm where her IV was placed. It swelled up until her husband's ring couldn't fit on her hand. Her swelling has since resolved.   She is worried about her hypokalemia, and wants to take a blood test, which we discussed. Reportedly her last potassium was 3.4   She denies any chest pain, shortness of breath, or peripheral edema. No lightheadedness, headaches, syncope, orthopnea, or PND.   Plan  for S-ICD implant.   Hospital Course     Consultants: N/A   Patient presented to the hospital on 03/01/2022 for the planned subcutaneous ICD implantation.  A [employer] subcutaneous ICD was successfully implanted for secondary prevention of sudden death.  Patient was seen on the following morning on 03/02/2022, at which time she was doing well without any discomfort or obvious complication.  Chest x-ray shows subcutaneous ICD is in correct position.  She was seen by Dr. Curt Bears who felt patient is stable for discharge from EP perspective. Office scheduler Rajinder Mesick contact her to arrange wound check and follow up. Patient has been instructed to resume Eliquis on the night of 11/25.      Did the patient have an acute coronary syndrome (MI, NSTEMI, STEMI, etc) this admission?:  No                               Did the patient have a percutaneous coronary intervention (stent / angioplasty)?:  No.          _____________  Discharge Vitals Blood pressure 102/75, pulse 84, temperature 98.1 F (36.7 C), temperature source Oral, resp. rate 18, height '3\' 10"'$  (1.168 m), weight 67.7 kg, SpO2 93 %.  Filed Weights   03/01/22 1210 03/01/22 1558 03/02/22 0504  Weight: 63.5 kg 67 kg 67.7 kg    Labs & Radiologic Studies    CBC No results for input(s): "WBC", "NEUTROABS", "  HGB", "HCT", "MCV", "PLT" in the last 72 hours. Basic Metabolic Panel No results for input(s): "NA", "K", "CL", "CO2", "GLUCOSE", "BUN", "CREATININE", "CALCIUM", "MG", "PHOS" in the last 72 hours. Liver Function Tests No results for input(s): "AST", "ALT", "ALKPHOS", "BILITOT", "PROT", "ALBUMIN" in the last 72 hours. No results for input(s): "LIPASE", "AMYLASE" in the last 72 hours. High Sensitivity Troponin:   No results for input(s): "TROPONINIHS" in the last 720 hours.  BNP Invalid input(s): "POCBNP" D-Dimer No results for input(s): "DDIMER" in the last 72 hours. Hemoglobin A1C No results for input(s): "HGBA1C" in the  last 72 hours. Fasting Lipid Panel No results for input(s): "CHOL", "HDL", "LDLCALC", "TRIG", "CHOLHDL", "LDLDIRECT" in the last 72 hours. Thyroid Function Tests No results for input(s): "TSH", "T4TOTAL", "T3FREE", "THYROIDAB" in the last 72 hours.  Invalid input(s): "FREET3" _____________  DG Chest 2 View  Result Date: 03/02/2022 CLINICAL DATA:  Defibrillator placement. EXAM: CHEST - 2 VIEW COMPARISON:  February 12, 2022. FINDINGS: The heart size and mediastinal contours are within normal limits. Left internal jugular Port-A-Cath is unchanged in position. Interval placement of defibrillator with single lead over cardiac shadow to left of thoracic spine. Minimal bibasilar subsegmental atelectasis is noted. The visualized skeletal structures are unremarkable. IMPRESSION: Interval placement of defibrillator with single lead over cardiac shadow. Minimal bibasilar subsegmental atelectasis. Electronically Signed   By: Marijo Conception M.D.   On: 03/02/2022 08:45   EP PPM/ICD IMPLANT  Result Date: 03/01/2022  CONCLUSIONS:  1.  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 Successful ICD implantation with a [employer] subcutaneous ICD implanted for secondary prevention of sudden death.  3.  No early apparent complications.  4. If her pocket is healing well on examination tomorrow, plan to restart anticoagulation tomorrow evening with close follow-up in the outpatient setting.   CT ABDOMEN PELVIS W CONTRAST  Result Date: 02/01/2022 CLINICAL DATA:  Nausea/vomiting, abdominal pain EXAM: CT ABDOMEN AND PELVIS WITH CONTRAST TECHNIQUE: Multidetector CT imaging of the abdomen and pelvis was performed using the standard protocol following bolus administration of intravenous contrast. RADIATION DOSE REDUCTION: This exam was performed according to the departmental dose-optimization program which includes automated exposure control,  adjustment of the mA and/or kV according to patient size and/or use of iterative reconstruction technique. CONTRAST:  72m OMNIPAQUE IOHEXOL 350 MG/ML SOLN COMPARISON:  12/05/2021 FINDINGS: Lower chest: No pleural or pericardial effusion. Coarse peripheral interstitial markings in the lung bases, right greater than left, improved since previous. Hepatobiliary: Heterogenous parenchyma likely reflecting some fatty infiltration, without discrete lesion or biliary ductal dilatation. Gallbladder not visualized. Pancreas: Unremarkable. No pancreatic ductal dilatation or surrounding inflammatory changes. Spleen: Normal in size without focal abnormality. Adrenals/Urinary Tract: Adrenal glands are unremarkable. Kidneys are normal, without renal calculi, focal lesion, or hydronephrosis. Bladder is unremarkable. Stomach/Bowel: Post gastric sleeve surgery. Stomach and small bowel decompressed. Appendix not discretely identified. Colon is incompletely distended, with mild circumferential wall thickening in the ascending segment. Distal colon unremarkable. Vascular/Lymphatic: Scattered calcified aortoiliac atheromatous plaque without aneurysm or evident stenosis. Portal vein patent. No abdominal or pelvic adenopathy. Reproductive: Uterus and bilateral adnexa are unremarkable. Other: Small volume perihepatic and pelvic ascites.  No free air. Musculoskeletal: Progressive height loss at the subacute presumably unhealed T9 compression fracture deformity, with stable minimal retropulsion. No acute findings. IMPRESSION: 1. Circumferential wall thickening in the ascending colon suggesting colitis. 2. Fatty liver with heterogenous parenchyma. 3. Small volume perihepatic and  pelvic ascites. 4. Progressive height loss at the subacute presumably unhealed T9 compression fracture deformity, with stable minimal retropulsion. 5. Aortic atherosclerosis (ICD10-I70.0). Electronically Signed   By: Lucrezia Europe M.D.   On: 02/01/2022 12:11    Disposition   Pt is being discharged home today in good condition.  Follow-up Plans & Appointments     Follow-up Information     Vickie Epley, MD Follow up.   Specialties: Cardiology, Radiology Why: You Harue Pribble be called by Dr. Mardene Speak scheduler to make wound check and follow up visits Contact information: [address] [place] [address] [phone]                Discharge Instructions     Diet - low sodium heart healthy   Complete by: As directed    Discharge wound care:   Complete by: As directed    See discharge instruction   Increase activity slowly   Complete by: As directed         Discharge Medications   Allergies as of 03/02/2022       Reactions   Lorazepam    Prolongs QT   Zofran [ondansetron]    Prolongs QT        Medication List     STOP taking these medications    vancomycin 125 MG capsule Commonly known as: VANCOCIN       TAKE these medications    aspirin 81 MG chewable tablet Chew 1 tablet (81 mg total) by mouth daily.   Belsomra 20 MG Tabs Generic drug: Suvorexant Take 1 tablet by mouth at bedtime as needed (sleep).   budesonide-formoterol 160-4.5 MCG/ACT inhaler Commonly known as: SYMBICORT Inhale 2 puffs into the lungs daily as needed (For shortess of breath).   busPIRone 10 MG tablet Commonly known as: BUSPAR Take 10 mg by mouth 2 (two) times daily.   Eliquis 5 MG Tabs tablet Generic drug: apixaban Take 5 mg by mouth 2 (two) times daily.   FLUoxetine HCl 60 MG Tabs Take 60 mg by mouth daily. What changed: when to take this   HYDROmorphone 4 MG tablet Commonly known as: DILAUDID Take 4 mg by mouth every 6 (six) hours as needed for severe pain.   levothyroxine 100 MCG tablet Commonly known as: SYNTHROID Take 100 mcg by mouth daily before breakfast.   magnesium oxide 400 MG tablet Commonly known as: MAG-OX Take 1 tablet (400 mg total) by mouth daily.   multivitamins with iron  Tabs tablet Take 1 tablet by mouth every morning.   oxyCODONE 15 MG immediate release tablet Commonly known as: ROXICODONE Take 15 mg by mouth every 6 (six) hours as needed for pain.   potassium chloride SA 20 MEQ tablet Commonly known as: KLOR-CON M Take 1 tablet (20 mEq total) by mouth 2 (two) times daily.   pregabalin 225 MG capsule Commonly known as: LYRICA Take 225 mg by mouth 2 (two) times daily.   promethazine 25 MG tablet Commonly known as: PHENERGAN Take 25 mg by mouth every 8 (eight) hours as needed for nausea or vomiting.   saccharomyces boulardii 250 MG capsule Commonly known as: FLORASTOR Take 1 capsule (250 mg total) by mouth 2 (two) times daily.   spironolactone 25 MG tablet Commonly known as: ALDACTONE Take 1 tablet (25 mg total) by mouth daily.               Discharge Care Instructions  (From admission, onward)  Start     Ordered   03/02/22 0000  Discharge wound care:       Comments: See discharge instruction   03/02/22 0900               Outstanding Labs/Studies   N/A  Duration of Discharge Encounter   Greater than 30 minutes including physician time.  Hilbert Corrigan, PA 03/02/2022, 9:02 AM    I have seen and examined this patient with Almyra Deforest.  Agree with above, note added to reflect my findings.  On exam, RRR, no murmurs, lungs clear.  She is now status post SICD for secondary prevention.  Device functioning appropriately.  Chest x-ray and interrogation without issue.  Plan for discharge today with follow-up in device clinic.  Charon Smedberg M. Marlen Mollica MD 03/02/2022 9:19 AM

## 2022-03-03 ENCOUNTER — Other Ambulatory Visit: Payer: Self-pay

## 2022-03-03 ENCOUNTER — Inpatient Hospital Stay (HOSPITAL_COMMUNITY)
Admission: EM | Admit: 2022-03-03 | Discharge: 2022-04-08 | DRG: 981 | Disposition: E | Payer: Medicare HMO | Attending: Internal Medicine | Admitting: Internal Medicine

## 2022-03-03 ENCOUNTER — Emergency Department (HOSPITAL_COMMUNITY): Payer: Medicare HMO

## 2022-03-03 ENCOUNTER — Encounter (HOSPITAL_COMMUNITY): Payer: Self-pay

## 2022-03-03 ENCOUNTER — Encounter: Payer: Self-pay | Admitting: Hematology and Oncology

## 2022-03-03 DIAGNOSIS — K7682 Hepatic encephalopathy: Secondary | ICD-10-CM | POA: Diagnosis not present

## 2022-03-03 DIAGNOSIS — S12690A Other displaced fracture of seventh cervical vertebra, initial encounter for closed fracture: Secondary | ICD-10-CM | POA: Diagnosis present

## 2022-03-03 DIAGNOSIS — Z9221 Personal history of antineoplastic chemotherapy: Secondary | ICD-10-CM

## 2022-03-03 DIAGNOSIS — S12490A Other displaced fracture of fifth cervical vertebra, initial encounter for closed fracture: Secondary | ICD-10-CM | POA: Diagnosis present

## 2022-03-03 DIAGNOSIS — Z95810 Presence of automatic (implantable) cardiac defibrillator: Secondary | ICD-10-CM

## 2022-03-03 DIAGNOSIS — Z95828 Presence of other vascular implants and grafts: Secondary | ICD-10-CM

## 2022-03-03 DIAGNOSIS — Z7982 Long term (current) use of aspirin: Secondary | ICD-10-CM

## 2022-03-03 DIAGNOSIS — R601 Generalized edema: Secondary | ICD-10-CM | POA: Diagnosis present

## 2022-03-03 DIAGNOSIS — Z89612 Acquired absence of left leg above knee: Secondary | ICD-10-CM

## 2022-03-03 DIAGNOSIS — I2510 Atherosclerotic heart disease of native coronary artery without angina pectoris: Secondary | ICD-10-CM | POA: Diagnosis present

## 2022-03-03 DIAGNOSIS — Z79890 Hormone replacement therapy (postmenopausal): Secondary | ICD-10-CM

## 2022-03-03 DIAGNOSIS — Z9884 Bariatric surgery status: Secondary | ICD-10-CM

## 2022-03-03 DIAGNOSIS — I4901 Ventricular fibrillation: Secondary | ICD-10-CM | POA: Diagnosis present

## 2022-03-03 DIAGNOSIS — S22000A Wedge compression fracture of unspecified thoracic vertebra, initial encounter for closed fracture: Secondary | ICD-10-CM

## 2022-03-03 DIAGNOSIS — Z7901 Long term (current) use of anticoagulants: Secondary | ICD-10-CM

## 2022-03-03 DIAGNOSIS — W050XXA Fall from non-moving wheelchair, initial encounter: Secondary | ICD-10-CM | POA: Diagnosis present

## 2022-03-03 DIAGNOSIS — Z79899 Other long term (current) drug therapy: Secondary | ICD-10-CM

## 2022-03-03 DIAGNOSIS — Z888 Allergy status to other drugs, medicaments and biological substances status: Secondary | ICD-10-CM

## 2022-03-03 DIAGNOSIS — D62 Acute posthemorrhagic anemia: Secondary | ICD-10-CM | POA: Diagnosis not present

## 2022-03-03 DIAGNOSIS — Z515 Encounter for palliative care: Secondary | ICD-10-CM

## 2022-03-03 DIAGNOSIS — K7581 Nonalcoholic steatohepatitis (NASH): Secondary | ICD-10-CM | POA: Diagnosis present

## 2022-03-03 DIAGNOSIS — I119 Hypertensive heart disease without heart failure: Secondary | ICD-10-CM | POA: Diagnosis present

## 2022-03-03 DIAGNOSIS — Z807 Family history of other malignant neoplasms of lymphoid, hematopoietic and related tissues: Secondary | ICD-10-CM

## 2022-03-03 DIAGNOSIS — S2232XA Fracture of one rib, left side, initial encounter for closed fracture: Secondary | ICD-10-CM

## 2022-03-03 DIAGNOSIS — F1020 Alcohol dependence, uncomplicated: Secondary | ICD-10-CM | POA: Diagnosis present

## 2022-03-03 DIAGNOSIS — Z89611 Acquired absence of right leg above knee: Secondary | ICD-10-CM

## 2022-03-03 DIAGNOSIS — S12590A Other displaced fracture of sixth cervical vertebra, initial encounter for closed fracture: Secondary | ICD-10-CM | POA: Diagnosis present

## 2022-03-03 DIAGNOSIS — S2222XA Fracture of body of sternum, initial encounter for closed fracture: Secondary | ICD-10-CM

## 2022-03-03 DIAGNOSIS — R627 Adult failure to thrive: Secondary | ICD-10-CM | POA: Diagnosis present

## 2022-03-03 DIAGNOSIS — Z79891 Long term (current) use of opiate analgesic: Secondary | ICD-10-CM

## 2022-03-03 DIAGNOSIS — I472 Ventricular tachycardia, unspecified: Secondary | ICD-10-CM | POA: Diagnosis present

## 2022-03-03 DIAGNOSIS — J690 Pneumonitis due to inhalation of food and vomit: Secondary | ICD-10-CM

## 2022-03-03 DIAGNOSIS — F32A Depression, unspecified: Secondary | ICD-10-CM | POA: Diagnosis present

## 2022-03-03 DIAGNOSIS — Z8249 Family history of ischemic heart disease and other diseases of the circulatory system: Secondary | ICD-10-CM

## 2022-03-03 DIAGNOSIS — Z8674 Personal history of sudden cardiac arrest: Secondary | ICD-10-CM

## 2022-03-03 DIAGNOSIS — Z853 Personal history of malignant neoplasm of breast: Secondary | ICD-10-CM

## 2022-03-03 DIAGNOSIS — E039 Hypothyroidism, unspecified: Secondary | ICD-10-CM | POA: Diagnosis present

## 2022-03-03 DIAGNOSIS — Z66 Do not resuscitate: Secondary | ICD-10-CM | POA: Diagnosis not present

## 2022-03-03 DIAGNOSIS — D638 Anemia in other chronic diseases classified elsewhere: Secondary | ICD-10-CM | POA: Diagnosis present

## 2022-03-03 DIAGNOSIS — Z1152 Encounter for screening for COVID-19: Secondary | ICD-10-CM

## 2022-03-03 DIAGNOSIS — G4733 Obstructive sleep apnea (adult) (pediatric): Secondary | ICD-10-CM | POA: Diagnosis present

## 2022-03-03 DIAGNOSIS — E876 Hypokalemia: Secondary | ICD-10-CM | POA: Diagnosis present

## 2022-03-03 DIAGNOSIS — Z6841 Body Mass Index (BMI) 40.0 and over, adult: Secondary | ICD-10-CM

## 2022-03-03 DIAGNOSIS — G546 Phantom limb syndrome with pain: Secondary | ICD-10-CM | POA: Diagnosis present

## 2022-03-03 DIAGNOSIS — J189 Pneumonia, unspecified organism: Secondary | ICD-10-CM

## 2022-03-03 DIAGNOSIS — R278 Other lack of coordination: Secondary | ICD-10-CM | POA: Diagnosis not present

## 2022-03-03 DIAGNOSIS — Z87891 Personal history of nicotine dependence: Secondary | ICD-10-CM

## 2022-03-03 DIAGNOSIS — J9601 Acute respiratory failure with hypoxia: Secondary | ICD-10-CM | POA: Diagnosis not present

## 2022-03-03 DIAGNOSIS — N179 Acute kidney failure, unspecified: Secondary | ICD-10-CM | POA: Diagnosis present

## 2022-03-03 DIAGNOSIS — Z86711 Personal history of pulmonary embolism: Secondary | ICD-10-CM

## 2022-03-03 DIAGNOSIS — S129XXA Fracture of neck, unspecified, initial encounter: Secondary | ICD-10-CM

## 2022-03-03 DIAGNOSIS — Z803 Family history of malignant neoplasm of breast: Secondary | ICD-10-CM

## 2022-03-03 DIAGNOSIS — K922 Gastrointestinal hemorrhage, unspecified: Secondary | ICD-10-CM

## 2022-03-03 DIAGNOSIS — Z8619 Personal history of other infectious and parasitic diseases: Secondary | ICD-10-CM

## 2022-03-03 DIAGNOSIS — R188 Other ascites: Secondary | ICD-10-CM

## 2022-03-03 DIAGNOSIS — M4854XA Collapsed vertebra, not elsewhere classified, thoracic region, initial encounter for fracture: Secondary | ICD-10-CM | POA: Diagnosis present

## 2022-03-03 DIAGNOSIS — I4581 Long QT syndrome: Secondary | ICD-10-CM | POA: Diagnosis present

## 2022-03-03 DIAGNOSIS — E872 Acidosis, unspecified: Secondary | ICD-10-CM | POA: Diagnosis present

## 2022-03-03 DIAGNOSIS — W19XXXA Unspecified fall, initial encounter: Principal | ICD-10-CM

## 2022-03-03 DIAGNOSIS — E871 Hypo-osmolality and hyponatremia: Secondary | ICD-10-CM | POA: Diagnosis present

## 2022-03-03 DIAGNOSIS — Z9011 Acquired absence of right breast and nipple: Secondary | ICD-10-CM

## 2022-03-03 DIAGNOSIS — G894 Chronic pain syndrome: Secondary | ICD-10-CM | POA: Diagnosis present

## 2022-03-03 DIAGNOSIS — Y92019 Unspecified place in single-family (private) house as the place of occurrence of the external cause: Secondary | ICD-10-CM

## 2022-03-03 DIAGNOSIS — Y95 Nosocomial condition: Secondary | ICD-10-CM | POA: Diagnosis present

## 2022-03-03 DIAGNOSIS — K920 Hematemesis: Secondary | ICD-10-CM | POA: Diagnosis not present

## 2022-03-03 DIAGNOSIS — Z7951 Long term (current) use of inhaled steroids: Secondary | ICD-10-CM

## 2022-03-03 DIAGNOSIS — F1120 Opioid dependence, uncomplicated: Secondary | ICD-10-CM | POA: Diagnosis present

## 2022-03-03 DIAGNOSIS — Z993 Dependence on wheelchair: Secondary | ICD-10-CM

## 2022-03-03 LAB — CBC WITH DIFFERENTIAL/PLATELET
Abs Immature Granulocytes: 0.07 10*3/uL (ref 0.00–0.07)
Basophils Absolute: 0 10*3/uL (ref 0.0–0.1)
Basophils Relative: 0 %
Eosinophils Absolute: 0.1 10*3/uL (ref 0.0–0.5)
Eosinophils Relative: 1 %
HCT: 27.5 % — ABNORMAL LOW (ref 36.0–46.0)
Hemoglobin: 9.4 g/dL — ABNORMAL LOW (ref 12.0–15.0)
Immature Granulocytes: 1 %
Lymphocytes Relative: 8 %
Lymphs Abs: 1 10*3/uL (ref 0.7–4.0)
MCH: 32.2 pg (ref 26.0–34.0)
MCHC: 34.2 g/dL (ref 30.0–36.0)
MCV: 94.2 fL (ref 80.0–100.0)
Monocytes Absolute: 1.1 10*3/uL — ABNORMAL HIGH (ref 0.1–1.0)
Monocytes Relative: 9 %
Neutro Abs: 9.8 10*3/uL — ABNORMAL HIGH (ref 1.7–7.7)
Neutrophils Relative %: 81 %
Platelets: 160 10*3/uL (ref 150–400)
RBC: 2.92 MIL/uL — ABNORMAL LOW (ref 3.87–5.11)
RDW: 21 % — ABNORMAL HIGH (ref 11.5–15.5)
WBC: 12.1 10*3/uL — ABNORMAL HIGH (ref 4.0–10.5)
nRBC: 0 % (ref 0.0–0.2)

## 2022-03-03 LAB — COMPREHENSIVE METABOLIC PANEL
ALT: 12 U/L (ref 0–44)
AST: 91 U/L — ABNORMAL HIGH (ref 15–41)
Albumin: 2.1 g/dL — ABNORMAL LOW (ref 3.5–5.0)
Alkaline Phosphatase: 229 U/L — ABNORMAL HIGH (ref 38–126)
Anion gap: 16 — ABNORMAL HIGH (ref 5–15)
BUN: 8 mg/dL (ref 6–20)
CO2: 17 mmol/L — ABNORMAL LOW (ref 22–32)
Calcium: 8.1 mg/dL — ABNORMAL LOW (ref 8.9–10.3)
Chloride: 97 mmol/L — ABNORMAL LOW (ref 98–111)
Creatinine, Ser: 1.18 mg/dL — ABNORMAL HIGH (ref 0.44–1.00)
GFR, Estimated: 56 mL/min — ABNORMAL LOW (ref >60)
Glucose, Bld: 127 mg/dL — ABNORMAL HIGH (ref 70–99)
Potassium: 3.6 mmol/L (ref 3.5–5.1)
Sodium: 130 mmol/L — ABNORMAL LOW (ref 135–145)
Total Bilirubin: 2.1 mg/dL — ABNORMAL HIGH (ref 0.3–1.2)
Total Protein: 5.8 g/dL — ABNORMAL LOW (ref 6.5–8.1)

## 2022-03-03 NOTE — ED Provider Triage Note (Addendum)
Emergency Medicine Provider Triage Evaluation Note  Mallory Ayers , a 51 y.o. female  was evaluated in triage.  Pt complains of fall from wheelchair.  Patient just discharged from hospital yesterday after having ICD placed.  States tonight she was trying to transfer from wheelchair to toilet and didn't have enough stretch so fell between toilet and chair, struck head on the floor.  No LOC.  She is on Eliquis usually (has not res-started it yet after procedure).  Reports headache and neck pain.  Review of Systems  Positive: Head injury, neck pain Negative: fever  Physical Exam  BP 101/65   Pulse (!) 108   Temp 99.9 F (37.7 C) (Oral)   Resp 20   SpO2 98%  Gen:   Awake, no distress   Resp:  Normal effort  MSK:   Moves extremities without difficulty  Other:  ICD pocket appears clean without signs of infection, small contusion noted to right parietal scalp, locally tender, several areas of bruising across BUE (appears old, various stages of healing)  Medical Decision Making  Medically screening exam initiated at 10:34 PM.  Appropriate orders placed.  KWANZA CANCELLIERE was informed that the remainder of the evaluation will be completed by another provider, this initial triage assessment does not replace that evaluation, and the importance of remaining in the ED until their evaluation is complete.  Fall from wheelchair, head trauma.  Usually on eliquis, has been held recently due to ICD placement.  She is not >65 so will not activate level 2 trauma, but will obtain CT head/neck, labs.  11:59 PM Notified by radiology of multiple fractures noted on CT cervical spine.  Placed in [place] J collar, patient and spouse updated with results.  Charge RN notified, will try to expedite room assignment.   Larene Pickett, PA-C 02/12/2022 2247    Larene Pickett, PA-C 03/04/22 0000

## 2022-03-03 NOTE — ED Triage Notes (Signed)
Pt reports she had a defibrillator placed on Friday 11/24. Tonight she fell out of her wheelchair when she was attempting to get up to use the bathroom. She hit the back of her head on the edge on her wheelchair. She takes Eliquis, she hasn't taken it in 2 days. She denies LOC. She reports pain to her head as well as her neck.  Bilateral AKAs.

## 2022-03-04 ENCOUNTER — Emergency Department (HOSPITAL_COMMUNITY): Payer: Medicare HMO

## 2022-03-04 ENCOUNTER — Encounter (HOSPITAL_COMMUNITY): Payer: Self-pay | Admitting: Emergency Medicine

## 2022-03-04 DIAGNOSIS — E872 Acidosis, unspecified: Secondary | ICD-10-CM | POA: Diagnosis present

## 2022-03-04 DIAGNOSIS — R188 Other ascites: Secondary | ICD-10-CM

## 2022-03-04 DIAGNOSIS — Z95828 Presence of other vascular implants and grafts: Secondary | ICD-10-CM | POA: Diagnosis not present

## 2022-03-04 DIAGNOSIS — Z1152 Encounter for screening for COVID-19: Secondary | ICD-10-CM | POA: Diagnosis not present

## 2022-03-04 DIAGNOSIS — I119 Hypertensive heart disease without heart failure: Secondary | ICD-10-CM | POA: Diagnosis present

## 2022-03-04 DIAGNOSIS — F32A Depression, unspecified: Secondary | ICD-10-CM | POA: Diagnosis present

## 2022-03-04 DIAGNOSIS — E871 Hypo-osmolality and hyponatremia: Secondary | ICD-10-CM | POA: Diagnosis present

## 2022-03-04 DIAGNOSIS — K7460 Unspecified cirrhosis of liver: Secondary | ICD-10-CM

## 2022-03-04 DIAGNOSIS — Z66 Do not resuscitate: Secondary | ICD-10-CM | POA: Diagnosis not present

## 2022-03-04 DIAGNOSIS — Y92019 Unspecified place in single-family (private) house as the place of occurrence of the external cause: Secondary | ICD-10-CM | POA: Diagnosis not present

## 2022-03-04 DIAGNOSIS — D638 Anemia in other chronic diseases classified elsewhere: Secondary | ICD-10-CM | POA: Diagnosis present

## 2022-03-04 DIAGNOSIS — N179 Acute kidney failure, unspecified: Secondary | ICD-10-CM | POA: Diagnosis present

## 2022-03-04 DIAGNOSIS — F1020 Alcohol dependence, uncomplicated: Secondary | ICD-10-CM | POA: Diagnosis present

## 2022-03-04 DIAGNOSIS — S2232XA Fracture of one rib, left side, initial encounter for closed fracture: Secondary | ICD-10-CM | POA: Diagnosis present

## 2022-03-04 DIAGNOSIS — Z95810 Presence of automatic (implantable) cardiac defibrillator: Secondary | ICD-10-CM | POA: Diagnosis not present

## 2022-03-04 DIAGNOSIS — K7682 Hepatic encephalopathy: Secondary | ICD-10-CM | POA: Diagnosis not present

## 2022-03-04 DIAGNOSIS — F1120 Opioid dependence, uncomplicated: Secondary | ICD-10-CM | POA: Diagnosis present

## 2022-03-04 DIAGNOSIS — K922 Gastrointestinal hemorrhage, unspecified: Secondary | ICD-10-CM | POA: Diagnosis not present

## 2022-03-04 DIAGNOSIS — E039 Hypothyroidism, unspecified: Secondary | ICD-10-CM | POA: Diagnosis present

## 2022-03-04 DIAGNOSIS — K7581 Nonalcoholic steatohepatitis (NASH): Secondary | ICD-10-CM | POA: Diagnosis present

## 2022-03-04 DIAGNOSIS — Z7189 Other specified counseling: Secondary | ICD-10-CM | POA: Diagnosis not present

## 2022-03-04 DIAGNOSIS — I472 Ventricular tachycardia, unspecified: Secondary | ICD-10-CM | POA: Diagnosis present

## 2022-03-04 DIAGNOSIS — Z7901 Long term (current) use of anticoagulants: Secondary | ICD-10-CM | POA: Diagnosis not present

## 2022-03-04 DIAGNOSIS — I4901 Ventricular fibrillation: Secondary | ICD-10-CM | POA: Diagnosis present

## 2022-03-04 DIAGNOSIS — Z6841 Body Mass Index (BMI) 40.0 and over, adult: Secondary | ICD-10-CM | POA: Diagnosis not present

## 2022-03-04 DIAGNOSIS — S12490A Other displaced fracture of fifth cervical vertebra, initial encounter for closed fracture: Secondary | ICD-10-CM | POA: Diagnosis present

## 2022-03-04 DIAGNOSIS — S129XXA Fracture of neck, unspecified, initial encounter: Secondary | ICD-10-CM

## 2022-03-04 DIAGNOSIS — S12690A Other displaced fracture of seventh cervical vertebra, initial encounter for closed fracture: Secondary | ICD-10-CM | POA: Diagnosis present

## 2022-03-04 DIAGNOSIS — J690 Pneumonitis due to inhalation of food and vomit: Secondary | ICD-10-CM | POA: Diagnosis not present

## 2022-03-04 DIAGNOSIS — J9601 Acute respiratory failure with hypoxia: Secondary | ICD-10-CM | POA: Diagnosis not present

## 2022-03-04 DIAGNOSIS — K7031 Alcoholic cirrhosis of liver with ascites: Secondary | ICD-10-CM | POA: Diagnosis not present

## 2022-03-04 DIAGNOSIS — W19XXXA Unspecified fall, initial encounter: Secondary | ICD-10-CM | POA: Diagnosis not present

## 2022-03-04 DIAGNOSIS — D62 Acute posthemorrhagic anemia: Secondary | ICD-10-CM | POA: Diagnosis not present

## 2022-03-04 DIAGNOSIS — J189 Pneumonia, unspecified organism: Secondary | ICD-10-CM | POA: Diagnosis present

## 2022-03-04 DIAGNOSIS — S2222XA Fracture of body of sternum, initial encounter for closed fracture: Secondary | ICD-10-CM | POA: Diagnosis not present

## 2022-03-04 DIAGNOSIS — W050XXA Fall from non-moving wheelchair, initial encounter: Secondary | ICD-10-CM | POA: Diagnosis present

## 2022-03-04 DIAGNOSIS — Z515 Encounter for palliative care: Secondary | ICD-10-CM | POA: Diagnosis not present

## 2022-03-04 DIAGNOSIS — S12590A Other displaced fracture of sixth cervical vertebra, initial encounter for closed fracture: Secondary | ICD-10-CM | POA: Diagnosis present

## 2022-03-04 DIAGNOSIS — Y95 Nosocomial condition: Secondary | ICD-10-CM | POA: Diagnosis present

## 2022-03-04 DIAGNOSIS — K920 Hematemesis: Secondary | ICD-10-CM | POA: Diagnosis not present

## 2022-03-04 LAB — URINALYSIS, ROUTINE W REFLEX MICROSCOPIC
Bilirubin Urine: NEGATIVE
Glucose, UA: NEGATIVE mg/dL
Hgb urine dipstick: NEGATIVE
Ketones, ur: NEGATIVE mg/dL
Nitrite: NEGATIVE
Protein, ur: NEGATIVE mg/dL
Specific Gravity, Urine: >1.046 — ABNORMAL HIGH (ref 1.005–1.030)
pH: 5 (ref 5.0–8.0)

## 2022-03-04 LAB — CBC WITH DIFFERENTIAL/PLATELET
Abs Immature Granulocytes: 0.06 10*3/uL (ref 0.00–0.07)
Basophils Absolute: 0 10*3/uL (ref 0.0–0.1)
Basophils Relative: 0 %
Eosinophils Absolute: 0.1 10*3/uL (ref 0.0–0.5)
Eosinophils Relative: 1 %
HCT: 25.6 % — ABNORMAL LOW (ref 36.0–46.0)
Hemoglobin: 8.5 g/dL — ABNORMAL LOW (ref 12.0–15.0)
Immature Granulocytes: 1 %
Lymphocytes Relative: 10 %
Lymphs Abs: 1 10*3/uL (ref 0.7–4.0)
MCH: 32 pg (ref 26.0–34.0)
MCHC: 33.2 g/dL (ref 30.0–36.0)
MCV: 96.2 fL (ref 80.0–100.0)
Monocytes Absolute: 0.8 10*3/uL (ref 0.1–1.0)
Monocytes Relative: 8 %
Neutro Abs: 7.5 10*3/uL (ref 1.7–7.7)
Neutrophils Relative %: 80 %
Platelets: 131 10*3/uL — ABNORMAL LOW (ref 150–400)
RBC: 2.66 MIL/uL — ABNORMAL LOW (ref 3.87–5.11)
RDW: 21.2 % — ABNORMAL HIGH (ref 11.5–15.5)
Smear Review: DECREASED
WBC: 9.5 10*3/uL (ref 4.0–10.5)
nRBC: 0 % (ref 0.0–0.2)

## 2022-03-04 LAB — RESPIRATORY PANEL BY PCR

## 2022-03-04 LAB — COMPREHENSIVE METABOLIC PANEL
ALT: 11 U/L (ref 0–44)
AST: 82 U/L — ABNORMAL HIGH (ref 15–41)
Albumin: 1.9 g/dL — ABNORMAL LOW (ref 3.5–5.0)
Alkaline Phosphatase: 232 U/L — ABNORMAL HIGH (ref 38–126)
Anion gap: 11 (ref 5–15)
BUN: 10 mg/dL (ref 6–20)
CO2: 18 mmol/L — ABNORMAL LOW (ref 22–32)
Calcium: 7.5 mg/dL — ABNORMAL LOW (ref 8.9–10.3)
Chloride: 100 mmol/L (ref 98–111)
Creatinine, Ser: 1.14 mg/dL — ABNORMAL HIGH (ref 0.44–1.00)
GFR, Estimated: 59 mL/min — ABNORMAL LOW (ref >60)
Glucose, Bld: 124 mg/dL — ABNORMAL HIGH (ref 70–99)
Potassium: 3.5 mmol/L (ref 3.5–5.1)
Sodium: 129 mmol/L — ABNORMAL LOW (ref 135–145)
Total Bilirubin: 1.8 mg/dL — ABNORMAL HIGH (ref 0.3–1.2)
Total Protein: 5.4 g/dL — ABNORMAL LOW (ref 6.5–8.1)

## 2022-03-04 LAB — I-STAT VENOUS BLOOD GAS, ED
Acid-base deficit: 5 mmol/L — ABNORMAL HIGH (ref 0.0–2.0)
Bicarbonate: 17.4 mmol/L — ABNORMAL LOW (ref 20.0–28.0)
Calcium, Ion: 0.98 mmol/L — ABNORMAL LOW (ref 1.15–1.40)
HCT: 25 % — ABNORMAL LOW (ref 36.0–46.0)
Hemoglobin: 8.5 g/dL — ABNORMAL LOW (ref 12.0–15.0)
O2 Saturation: 91 %
Patient temperature: 99.2
Potassium: 3.5 mmol/L (ref 3.5–5.1)
Sodium: 128 mmol/L — ABNORMAL LOW (ref 135–145)
TCO2: 18 mmol/L — ABNORMAL LOW (ref 22–32)
pCO2, Ven: 22.9 mmHg — ABNORMAL LOW (ref 44–60)
pH, Ven: 7.49 — ABNORMAL HIGH (ref 7.25–7.43)
pO2, Ven: 56 mmHg — ABNORMAL HIGH (ref 32–45)

## 2022-03-04 LAB — RESP PANEL BY RT-PCR (FLU A&B, COVID) ARPGX2
Influenza A by PCR: NEGATIVE
Influenza B by PCR: NEGATIVE
SARS Coronavirus 2 by RT PCR: NEGATIVE

## 2022-03-04 LAB — STREP PNEUMONIAE URINARY ANTIGEN: Strep Pneumo Urinary Antigen: NEGATIVE

## 2022-03-04 LAB — MRSA NEXT GEN BY PCR, NASAL: MRSA by PCR Next Gen: NOT DETECTED

## 2022-03-04 LAB — MAGNESIUM: Magnesium: 1.7 mg/dL (ref 1.7–2.4)

## 2022-03-04 LAB — HIV ANTIBODY (ROUTINE TESTING W REFLEX): HIV Screen 4th Generation wRfx: NONREACTIVE

## 2022-03-04 MED ORDER — VANCOMYCIN HCL IN DEXTROSE 1-5 GM/200ML-% IV SOLN: 1000.0000 mg | Freq: Once | INTRAVENOUS | Status: DC

## 2022-03-04 MED ORDER — SODIUM CHLORIDE 0.9 % IV SOLN
2.0000 g | Freq: Two times a day (BID) | INTRAVENOUS | Status: DC
  Administered 2022-03-04: 2 g via INTRAVENOUS
  Filled 2022-03-04: qty 12.5

## 2022-03-04 MED ORDER — FLUOXETINE HCL 20 MG PO CAPS
60.0000 mg | ORAL_CAPSULE | Freq: Every day | ORAL | Status: DC
  Administered 2022-03-04 – 2022-03-07 (×3): 60 mg via ORAL
  Filled 2022-03-04 (×3): qty 3

## 2022-03-04 MED ORDER — SACCHAROMYCES BOULARDII 250 MG PO CAPS
250.0000 mg | ORAL_CAPSULE | Freq: Two times a day (BID) | ORAL | Status: DC
  Administered 2022-03-04 – 2022-03-05 (×4): 250 mg via ORAL
  Filled 2022-03-04 (×7): qty 1

## 2022-03-04 MED ORDER — BUSPIRONE HCL 5 MG PO TABS
10.0000 mg | ORAL_TABLET | Freq: Two times a day (BID) | ORAL | Status: DC
  Administered 2022-03-04 – 2022-03-07 (×5): 10 mg via ORAL
  Filled 2022-03-04 (×2): qty 1
  Filled 2022-03-04 (×2): qty 2
  Filled 2022-03-04: qty 1

## 2022-03-04 MED ORDER — VANCOMYCIN VARIABLE DOSE PER UNSTABLE RENAL FUNCTION (PHARMACIST DOSING): Status: DC

## 2022-03-04 MED ORDER — SODIUM CHLORIDE 0.9% FLUSH
10.0000 mL | Freq: Two times a day (BID) | INTRAVENOUS | Status: DC
  Administered 2022-03-04 (×2): 10 mL
  Administered 2022-03-04: 20 mL
  Administered 2022-03-05 – 2022-03-08 (×4): 10 mL

## 2022-03-04 MED ORDER — VANCOMYCIN HCL 1250 MG/250ML IV SOLN
1250.0000 mg | Freq: Once | INTRAVENOUS | Status: AC
  Administered 2022-03-04: 1250 mg via INTRAVENOUS
  Filled 2022-03-04: qty 250

## 2022-03-04 MED ORDER — VANCOMYCIN HCL 500 MG/100ML IV SOLN
500.0000 mg | Freq: Two times a day (BID) | INTRAVENOUS | Status: DC
  Administered 2022-03-04: 500 mg via INTRAVENOUS
  Filled 2022-03-04 (×2): qty 100

## 2022-03-04 MED ORDER — IOHEXOL 350 MG/ML SOLN
75.0000 mL | Freq: Once | INTRAVENOUS | Status: AC | PRN
  Administered 2022-03-04: 75 mL via INTRAVENOUS

## 2022-03-04 MED ORDER — POTASSIUM CHLORIDE CRYS ER 20 MEQ PO TBCR
20.0000 meq | EXTENDED_RELEASE_TABLET | Freq: Two times a day (BID) | ORAL | Status: DC
  Administered 2022-03-04 – 2022-03-05 (×4): 20 meq via ORAL
  Filled 2022-03-04 (×5): qty 1

## 2022-03-04 MED ORDER — HYDROMORPHONE HCL 2 MG PO TABS
4.0000 mg | ORAL_TABLET | Freq: Four times a day (QID) | ORAL | Status: DC | PRN
  Administered 2022-03-04 – 2022-03-05 (×3): 4 mg via ORAL
  Filled 2022-03-04 (×3): qty 2

## 2022-03-04 MED ORDER — MAGNESIUM OXIDE -MG SUPPLEMENT 400 (240 MG) MG PO TABS
400.0000 mg | ORAL_TABLET | Freq: Every day | ORAL | Status: DC
  Administered 2022-03-04 – 2022-03-05 (×2): 400 mg via ORAL
  Filled 2022-03-04 (×3): qty 1

## 2022-03-04 MED ORDER — METHOCARBAMOL 500 MG PO TABS
500.0000 mg | ORAL_TABLET | Freq: Three times a day (TID) | ORAL | Status: DC | PRN
  Administered 2022-03-04: 500 mg via ORAL
  Filled 2022-03-04: qty 1

## 2022-03-04 MED ORDER — APIXABAN 5 MG PO TABS: 5.0000 mg | ORAL_TABLET | Freq: Two times a day (BID) | ORAL | Status: DC

## 2022-03-04 MED ORDER — SODIUM CHLORIDE 0.9 % IV SOLN: 2.0000 g | Freq: Three times a day (TID) | INTRAVENOUS | Status: DC

## 2022-03-04 MED ORDER — MOMETASONE FURO-FORMOTEROL FUM 200-5 MCG/ACT IN AERO
2.0000 | INHALATION_SPRAY | Freq: Two times a day (BID) | RESPIRATORY_TRACT | Status: DC
  Administered 2022-03-04 – 2022-03-07 (×5): 2 via RESPIRATORY_TRACT
  Filled 2022-03-04 (×2): qty 8.8

## 2022-03-04 MED ORDER — PREGABALIN 75 MG PO CAPS
225.0000 mg | ORAL_CAPSULE | Freq: Two times a day (BID) | ORAL | Status: DC
  Administered 2022-03-04 – 2022-03-05 (×4): 225 mg via ORAL
  Filled 2022-03-04: qty 1
  Filled 2022-03-04: qty 3
  Filled 2022-03-04: qty 1
  Filled 2022-03-04: qty 3
  Filled 2022-03-04: qty 1

## 2022-03-04 MED ORDER — CHLORHEXIDINE GLUCONATE CLOTH 2 % EX PADS
6.0000 | MEDICATED_PAD | Freq: Every day | CUTANEOUS | Status: DC
  Administered 2022-03-06 – 2022-03-07 (×3): 6 via TOPICAL

## 2022-03-04 MED ORDER — APIXABAN 5 MG PO TABS
5.0000 mg | ORAL_TABLET | Freq: Two times a day (BID) | ORAL | Status: DC
  Administered 2022-03-05 (×2): 5 mg via ORAL
  Filled 2022-03-04 (×2): qty 1

## 2022-03-04 MED ORDER — LIDOCAINE 5 % EX PTCH
1.0000 | MEDICATED_PATCH | CUTANEOUS | Status: DC
  Administered 2022-03-04 – 2022-03-07 (×3): 1 via TRANSDERMAL
  Filled 2022-03-04 (×4): qty 1

## 2022-03-04 MED ORDER — LEVOTHYROXINE SODIUM 100 MCG PO TABS
100.0000 ug | ORAL_TABLET | Freq: Every day | ORAL | Status: DC
  Administered 2022-03-04 – 2022-03-05 (×2): 100 ug via ORAL
  Filled 2022-03-04 (×2): qty 1

## 2022-03-04 MED ORDER — ASPIRIN 81 MG PO CHEW
81.0000 mg | CHEWABLE_TABLET | Freq: Every day | ORAL | Status: DC
  Administered 2022-03-04 – 2022-03-05 (×2): 81 mg via ORAL
  Filled 2022-03-04 (×2): qty 1

## 2022-03-04 MED ORDER — SODIUM CHLORIDE 0.9 % IV SOLN
2.0000 g | INTRAVENOUS | Status: AC
  Administered 2022-03-04: 2 g via INTRAVENOUS
  Filled 2022-03-04: qty 12.5

## 2022-03-04 MED ORDER — OXYCODONE HCL 5 MG PO TABS
15.0000 mg | ORAL_TABLET | Freq: Four times a day (QID) | ORAL | Status: DC | PRN
  Administered 2022-03-04 – 2022-03-05 (×2): 15 mg via ORAL
  Filled 2022-03-04 (×2): qty 3

## 2022-03-04 MED ORDER — ADULT MULTIVITAMIN W/MINERALS CH
1.0000 | ORAL_TABLET | Freq: Every morning | ORAL | Status: DC
  Administered 2022-03-04 – 2022-03-05 (×2): 1 via ORAL
  Filled 2022-03-04 (×3): qty 1

## 2022-03-04 MED ORDER — FENTANYL CITRATE PF 50 MCG/ML IJ SOSY: 50.0000 ug | PREFILLED_SYRINGE | Freq: Once | INTRAMUSCULAR | Status: DC

## 2022-03-04 MED FILL — Gentamicin Sulfate Inj 40 MG/ML: INTRAMUSCULAR | Qty: 80 | Status: AC

## 2022-03-04 NOTE — ED Provider Notes (Signed)
[facility] EMERGENCY DEPARTMENT Provider Note   CSN: [id number] Arrival date & time: 02/24/2022  2223     History  Chief Complaint  Patient presents with   Lytle Michaels    Mallory Ayers is a 51 y.o. female.  The history is provided by the patient.  Fall This is a new problem. The current episode started 3 to 5 hours ago. The problem occurs constantly. The problem has been resolved. Pertinent negatives include no chest pain and no abdominal pain. Nothing aggravates the symptoms. Nothing relieves the symptoms. She has tried nothing for the symptoms. The treatment provided no relief.  Patient who was recently discharged from the hospital post ICD placement for a cardiac arrest earlier this year presents following a fall attempting to transfer to wheelchair.       Home Medications Prior to Admission medications   Medication Sig Start Date End Date Taking? Authorizing Provider  apixaban (ELIQUIS) 5 MG TABS tablet Take 5 mg by mouth 2 (two) times daily.   Yes [provider]  aspirin 81 MG chewable tablet Chew 1 tablet (81 mg total) by mouth daily. 12/21/21  Yes Memon, Jolaine Artist, MD  BELSOMRA 20 MG TABS Take 1 tablet by mouth at bedtime as needed (sleep). 09/19/21  Yes [provider]  budesonide-formoterol (SYMBICORT) 160-4.5 MCG/ACT inhaler Inhale 2 puffs into the lungs daily as needed (For shortess of breath).   Yes [provider]  busPIRone (BUSPAR) 10 MG tablet Take 10 mg by mouth 2 (two) times daily. 11/06/21  Yes [provider]  FLUoxetine HCl 60 MG TABS Take 60 mg by mouth daily. Patient taking differently: Take 60 mg by mouth at bedtime. 08/02/21  Yes [name], MD  HYDROmorphone (DILAUDID) 4 MG tablet Take 4 mg by mouth every 6 (six) hours as needed for severe pain.   Yes [provider]  levothyroxine (SYNTHROID) 100 MCG tablet Take 100 mcg by mouth daily before breakfast.   Yes [provider]  magnesium  oxide (MAG-OX) 400 MG tablet Take 1 tablet (400 mg total) by mouth daily. 01/02/22  Yes Shirley Friar, PA-C  Multiple Vitamins-Iron (MULTIVITAMINS WITH IRON) TABS tablet Take 1 tablet by mouth every morning.   Yes [provider]  oxyCODONE (ROXICODONE) 15 MG immediate release tablet Take 15 mg by mouth every 6 (six) hours as needed for pain. 11/22/21  Yes [provider]  potassium chloride SA (KLOR-CON M) 20 MEQ tablet Take 1 tablet (20 mEq total) by mouth 2 (two) times daily. 03/02/22  Yes Almyra Deforest, PA  pregabalin (LYRICA) 225 MG capsule Take 225 mg by mouth 2 (two) times daily. 11/22/21  Yes [provider]  promethazine (PHENERGAN) 25 MG tablet Take 25 mg by mouth every 8 (eight) hours as needed for nausea or vomiting. 02/14/22  Yes [provider]  saccharomyces boulardii (FLORASTOR) 250 MG capsule Take 1 capsule (250 mg total) by mouth 2 (two) times daily. 02/03/22  Yes Farrel Gordon, DO  spironolactone (ALDACTONE) 25 MG tablet Take 1 tablet (25 mg total) by mouth daily. Patient not taking: Reported on 03/04/2022 03/02/22   Almyra Deforest, PA      Allergies    Lorazepam and Zofran [ondansetron]    Review of Systems   Review of Systems  HENT:  Negative for facial swelling.   Respiratory:  Negative for wheezing and stridor.   Cardiovascular:  Negative for chest pain.  Gastrointestinal:  Negative for abdominal pain.  Musculoskeletal:  Positive for arthralgias.  All other systems reviewed and are negative.   Physical Exam Updated Vital Signs BP 101/69   Pulse 100   Temp 99.9 F (37.7 C) (Oral)   Resp 16   Ht '3\' 10"'$  (1.168 m)   Wt 67.6 kg   LMP 03/08/2014   SpO2 96%   BMI 49.51 kg/m  Physical Exam Vitals and nursing note reviewed.  Constitutional:      General: She is not in acute distress.    Appearance: She is well-developed.  HENT:     Head: Normocephalic and atraumatic.     Nose: Nose normal.  Eyes:     Pupils: Pupils are equal,  round, and reactive to light.  Neck:     Comments: C collar in place trachea is midline  Cardiovascular:     Rate and Rhythm: Normal rate and regular rhythm.     Pulses: Normal pulses.     Heart sounds: Normal heart sounds.  Pulmonary:     Effort: No respiratory distress.     Breath sounds: Rales present.  Abdominal:     General: Bowel sounds are normal. There is no distension.     Palpations: Abdomen is soft.     Tenderness: There is no abdominal tenderness. There is no guarding or rebound.  Genitourinary:    Vagina: No vaginal discharge.  Musculoskeletal:        General: Normal range of motion.  Skin:    General: Skin is warm and dry.     Capillary Refill: Capillary refill takes less than 2 seconds.     Findings: No erythema or rash.  Neurological:     General: No focal deficit present.     Mental Status: She is alert.     Deep Tendon Reflexes: Reflexes normal.  Psychiatric:        Mood and Affect: Mood normal.     ED Results / Procedures / Treatments   Labs (all labs ordered are listed, but only abnormal results are displayed) Results for orders placed or performed during the hospital encounter of 02/08/2022  CBC with Differential  Result Value Ref Range   WBC 12.1 (H) 4.0 - 10.5 K/uL   RBC 2.92 (L) 3.87 - 5.11 MIL/uL   Hemoglobin 9.4 (L) 12.0 - 15.0 g/dL   HCT 27.5 (L) 36.0 - 46.0 %   MCV 94.2 80.0 - 100.0 fL   MCH 32.2 26.0 - 34.0 pg   MCHC 34.2 30.0 - 36.0 g/dL   RDW 21.0 (H) 11.5 - 15.5 %   Platelets 160 150 - 400 K/uL   nRBC 0.0 0.0 - 0.2 %   Neutrophils Relative % 81 %   Neutro Abs 9.8 (H) 1.7 - 7.7 K/uL   Lymphocytes Relative 8 %   Lymphs Abs 1.0 0.7 - 4.0 K/uL   Monocytes Relative 9 %   Monocytes Absolute 1.1 (H) 0.1 - 1.0 K/uL   Eosinophils Relative 1 %   Eosinophils Absolute 0.1 0.0 - 0.5 K/uL   Basophils Relative 0 %   Basophils Absolute 0.0 0.0 - 0.1 K/uL   Immature Granulocytes 1 %   Abs Immature Granulocytes 0.07 0.00 - 0.07 K/uL   Comprehensive metabolic panel  Result Value Ref Range   Sodium 130 (L) 135 - 145 mmol/L   Potassium 3.6 3.5 - 5.1 mmol/L   Chloride 97 (L) 98 - 111 mmol/L   CO2 17 (L) 22 - 32 mmol/L   Glucose, Bld 127 (H) 70 -  99 mg/dL   BUN 8 6 - 20 mg/dL   Creatinine, Ser 1.18 (H) 0.44 - 1.00 mg/dL   Calcium 8.1 (L) 8.9 - 10.3 mg/dL   Total Protein 5.8 (L) 6.5 - 8.1 g/dL   Albumin 2.1 (L) 3.5 - 5.0 g/dL   AST 91 (H) 15 - 41 U/L   ALT 12 0 - 44 U/L   Alkaline Phosphatase 229 (H) 38 - 126 U/L   Total Bilirubin 2.1 (H) 0.3 - 1.2 mg/dL   GFR, Estimated 56 (L) >60 mL/min   Anion gap 16 (H) 5 - 15   CT CHEST ABDOMEN PELVIS W CONTRAST  Result Date: 03/04/2022 CLINICAL DATA:  Status post fall. EXAM: CT CHEST, ABDOMEN, AND PELVIS WITH CONTRAST TECHNIQUE: Multidetector CT imaging of the chest, abdomen and pelvis was performed following the standard protocol during bolus administration of intravenous contrast. RADIATION DOSE REDUCTION: This exam was performed according to the departmental dose-optimization program which includes automated exposure control, adjustment of the mA and/or kV according to patient size and/or use of iterative reconstruction technique. CONTRAST:  21m OMNIPAQUE IOHEXOL 350 MG/ML SOLN COMPARISON:  February 12, 2022 FINDINGS: CT CHEST FINDINGS Cardiovascular: There is stable left-sided venous Port-A-Cath positioning. Mild calcification of the aortic arch is noted, without evidence of aortic aneurysm or dissection. Normal heart size with marked severity coronary artery calcification. No pericardial effusion. Mediastinum/Nodes: No enlarged mediastinal, hilar, or axillary lymph nodes. Thyroid gland, trachea, and esophagus demonstrate no significant findings. Lungs/Pleura: Moderate severity multifocal infiltrates are seen throughout both lungs. This is slightly more prominent on the left and represents a new finding when compared to the prior study. There is no evidence of a pleural effusion or  pneumothorax. Musculoskeletal: A mild amount of subcutaneous air is seen anterior to the body of the sternum, to the left of midline. A mild amount of soft tissue air is also seen along the posterior and lateral aspects of the lower left ribcage. A single lead ventricular pacer is noted within the posterolateral aspect of the lower left chest wall. This represents a new finding when compared to the prior study. Numerous chronic bilateral rib fractures are seen. A new lateral second left rib fracture is noted (axial CT image 26, CT series 6). A chronic fracture is seen involving the inferior aspect of the body of the sternum. A new fracture of the anterior aspect of the mid to upper body of the sternum is noted. There is a chronic compression fracture deformity at the level of T9. A new compression fracture deformity is seen involving predominantly the superior endplate of the T5 vertebral body. CT ABDOMEN PELVIS FINDINGS Hepatobiliary: The liver is cirrhotic, with diffuse heterogeneous appearance of the liver parenchyma. Status post cholecystectomy. No biliary dilatation. Pancreas: Unremarkable. No pancreatic ductal dilatation or surrounding inflammatory changes. Spleen: Normal in size without focal abnormality. Adrenals/Urinary Tract: Adrenal glands are unremarkable. Kidneys are normal, without renal calculi, focal lesion, or hydronephrosis. The urinary bladder is markedly distended and is otherwise unremarkable. Stomach/Bowel: Surgical sutures are seen throughout the gastric region. The appendix is not clearly identified. No evidence of bowel wall thickening, distention, or inflammatory changes. Vascular/Lymphatic: Aortic atherosclerosis. No enlarged abdominal or pelvic lymph nodes. Reproductive: Uterus and bilateral adnexa are unremarkable. Other: No abdominal wall hernia or abnormality. There is moderate severity abdominopelvic ascites. This is increased in severity when compared to the prior study.  Musculoskeletal: No acute or significant osseous findings. IMPRESSION: 1. Moderate severity bilateral multifocal infiltrates. 2. Interval single lead  ventricular pacer placement since the prior exam. 3. Acute lateral second left rib fracture with an acute fracture of the anterior aspect of the mid to upper body of the sternum. 4. Chronic compression fracture deformity at the level of T9. 5. New compression fracture deformity involving the superior endplate of the T5 vertebral body. 6. Hepatic cirrhosis. 7. Moderate severity abdominopelvic ascites, increased in severity when compared to the prior study. 8. 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 9. Aortic atherosclerosis. Aortic Atherosclerosis (ICD10-I70.0). Electronically Signed   By: Virgina Norfolk M.D.   On: 03/04/2022 01:50   CT HEAD WO CONTRAST (5MM)  Result Date: 02/11/2022 CLINICAL DATA:  Recent fall with headaches and chest pain, initial encounter EXAM: CT HEAD WITHOUT CONTRAST CT CERVICAL SPINE WITHOUT CONTRAST TECHNIQUE: Multidetector CT imaging of the head and cervical spine was performed following the standard protocol without intravenous contrast. Multiplanar CT image reconstructions of the cervical spine were also generated. RADIATION DOSE REDUCTION: This exam was performed according to the departmental dose-optimization program which includes automated exposure control, adjustment of the mA and/or kV according to patient size and/or use of iterative reconstruction technique. COMPARISON:  12/05/2021, 02/12/2022 FINDINGS: CT HEAD FINDINGS Brain: No evidence of acute infarction, hemorrhage, hydrocephalus, extra-axial collection or mass lesion/mass effect. Atrophic changes are noted. Vascular: No hyperdense vessel or unexpected calcification. Skull: Normal. Negative for fracture or focal lesion. Sinuses/Orbits: No acute finding. Other: None. CT CERVICAL SPINE FINDINGS Alignment: Loss of the normal cervical lordosis is noted.  Skull base and vertebrae: 7 cervical segments are well visualized. Increased kyphosis is noted centered at the C6 level. Mild compression deformity at C7 is noted new from a prior CT from 02/12/2022. There is a mildly displaced fracture involving the left superior articular facet at C7. Additionally there is a fracture involving the left lamina at C6. Avulsion from the inferior most aspect of the left inferior articular facet is noted at C5. This is best seen on image number 39 of series 11. No other definitive fractures are seen. The odontoid is within normal limits. Mild osteophytic changes and facet hypertrophic changes are noted. Soft tissues and spinal canal: Surrounding soft tissue structures are within normal limits. No focal hematoma is seen. Upper chest: Visualized lung apices demonstrate patchy alveolar opacities new from the prior CT examination. Early infiltrate cannot be totally excluded. Other: None IMPRESSION: CT of the head: Chronic atrophic changes without acute intracranial abnormality. CT of the cervical spine: Mild compression fracture at C7 new from the prior exam of 02/12/2022. Additionally there are fractures involving the superior articular facet on the left at C7 as well as the lamina on the left at C6. Small avulsion involving the inferior articular facet at C5 is noted as well. Patchy airspace opacities are noted in the apices bilaterally. Early infiltrate cannot be totally excluded. Critical Value/emergent results were called by telephone at the time of interpretation on 02/18/2022 at 11:53 pm to [facility] [place] , who verbally acknowledged these results. Electronically Signed   By: Inez Catalina M.D.   On: 02/15/2022 23:54   CT Cervical Spine Wo Contrast  Result Date: 02/16/2022 CLINICAL DATA:  Recent fall with headaches and chest pain, initial encounter EXAM: CT HEAD WITHOUT CONTRAST CT CERVICAL SPINE WITHOUT CONTRAST TECHNIQUE: Multidetector CT imaging of the head and cervical  spine was performed following the standard protocol without intravenous contrast. Multiplanar CT image reconstructions of the cervical spine were also generated. RADIATION DOSE REDUCTION: This exam was performed according  to the departmental dose-optimization program which includes automated exposure control, adjustment of the mA and/or kV according to patient size and/or use of iterative reconstruction technique. COMPARISON:  12/05/2021, 02/12/2022 FINDINGS: CT HEAD FINDINGS Brain: No evidence of acute infarction, hemorrhage, hydrocephalus, extra-axial collection or mass lesion/mass effect. Atrophic changes are noted. Vascular: No hyperdense vessel or unexpected calcification. Skull: Normal. Negative for fracture or focal lesion. Sinuses/Orbits: No acute finding. Other: None. CT CERVICAL SPINE FINDINGS Alignment: Loss of the normal cervical lordosis is noted. Skull base and vertebrae: 7 cervical segments are well visualized. Increased kyphosis is noted centered at the C6 level. Mild compression deformity at C7 is noted new from a prior CT from 02/12/2022. There is a mildly displaced fracture involving the left superior articular facet at C7. Additionally there is a fracture involving the left lamina at C6. Avulsion from the inferior most aspect of the left inferior articular facet is noted at C5. This is best seen on image number 39 of series 11. No other definitive fractures are seen. The odontoid is within normal limits. Mild osteophytic changes and facet hypertrophic changes are noted. Soft tissues and spinal canal: Surrounding soft tissue structures are within normal limits. No focal hematoma is seen. Upper chest: Visualized lung apices demonstrate patchy alveolar opacities new from the prior CT examination. Early infiltrate cannot be totally excluded. Other: None IMPRESSION: CT of the head: Chronic atrophic changes without acute intracranial abnormality. CT of the cervical spine: Mild compression fracture at  C7 new from the prior exam of 02/12/2022. Additionally there are fractures involving the superior articular facet on the left at C7 as well as the lamina on the left at C6. Small avulsion involving the inferior articular facet at C5 is noted as well. Patchy airspace opacities are noted in the apices bilaterally. Early infiltrate cannot be totally excluded. Critical Value/emergent results were called by telephone at the time of interpretation on 02/26/2022 at 11:53 pm to [facility], [place] , who verbally acknowledged these results. Electronically Signed   By: Inez Catalina M.D.   On: 02/22/2022 23:54   DG Chest 2 View  Result Date: 03/02/2022 CLINICAL DATA:  Defibrillator placement. EXAM: CHEST - 2 VIEW COMPARISON:  February 12, 2022. FINDINGS: The heart size and mediastinal contours are within normal limits. Left internal jugular Port-A-Cath is unchanged in position. Interval placement of defibrillator with single lead over cardiac shadow to left of thoracic spine. Minimal bibasilar subsegmental atelectasis is noted. The visualized skeletal structures are unremarkable. IMPRESSION: Interval placement of defibrillator with single lead over cardiac shadow. Minimal bibasilar subsegmental atelectasis. Electronically Signed   By: Marijo Conception M.D.   On: 03/02/2022 08:45   EP PPM/ICD IMPLANT  Result Date: 03/01/2022  CONCLUSIONS:  1.  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 Successful ICD implantation with a [employer] subcutaneous ICD implanted for secondary prevention of sudden death.  3.  No early apparent complications.  4. If her pocket is healing well on examination tomorrow, plan to restart anticoagulation tomorrow evening with close follow-up in the outpatient setting.     EKG None  Radiology CT CHEST ABDOMEN PELVIS W CONTRAST  Result Date: 03/04/2022 CLINICAL DATA:  Status post fall. EXAM: CT CHEST, ABDOMEN,  AND PELVIS WITH CONTRAST TECHNIQUE: Multidetector CT imaging of the chest, abdomen and pelvis was performed following the standard protocol during bolus administration of intravenous contrast. RADIATION DOSE REDUCTION: This exam was performed according  to the departmental dose-optimization program which includes automated exposure control, adjustment of the mA and/or kV according to patient size and/or use of iterative reconstruction technique. CONTRAST:  44m OMNIPAQUE IOHEXOL 350 MG/ML SOLN COMPARISON:  February 12, 2022 FINDINGS: CT CHEST FINDINGS Cardiovascular: There is stable left-sided venous Port-A-Cath positioning. Mild calcification of the aortic arch is noted, without evidence of aortic aneurysm or dissection. Normal heart size with marked severity coronary artery calcification. No pericardial effusion. Mediastinum/Nodes: No enlarged mediastinal, hilar, or axillary lymph nodes. Thyroid gland, trachea, and esophagus demonstrate no significant findings. Lungs/Pleura: Moderate severity multifocal infiltrates are seen throughout both lungs. This is slightly more prominent on the left and represents a new finding when compared to the prior study. There is no evidence of a pleural effusion or pneumothorax. Musculoskeletal: A mild amount of subcutaneous air is seen anterior to the body of the sternum, to the left of midline. A mild amount of soft tissue air is also seen along the posterior and lateral aspects of the lower left ribcage. A single lead ventricular pacer is noted within the posterolateral aspect of the lower left chest wall. This represents a new finding when compared to the prior study. Numerous chronic bilateral rib fractures are seen. A new lateral second left rib fracture is noted (axial CT image 26, CT series 6). A chronic fracture is seen involving the inferior aspect of the body of the sternum. A new fracture of the anterior aspect of the mid to upper body of the sternum is noted. There is a  chronic compression fracture deformity at the level of T9. A new compression fracture deformity is seen involving predominantly the superior endplate of the T5 vertebral body. CT ABDOMEN PELVIS FINDINGS Hepatobiliary: The liver is cirrhotic, with diffuse heterogeneous appearance of the liver parenchyma. Status post cholecystectomy. No biliary dilatation. Pancreas: Unremarkable. No pancreatic ductal dilatation or surrounding inflammatory changes. Spleen: Normal in size without focal abnormality. Adrenals/Urinary Tract: Adrenal glands are unremarkable. Kidneys are normal, without renal calculi, focal lesion, or hydronephrosis. The urinary bladder is markedly distended and is otherwise unremarkable. Stomach/Bowel: Surgical sutures are seen throughout the gastric region. The appendix is not clearly identified. No evidence of bowel wall thickening, distention, or inflammatory changes. Vascular/Lymphatic: Aortic atherosclerosis. No enlarged abdominal or pelvic lymph nodes. Reproductive: Uterus and bilateral adnexa are unremarkable. Other: No abdominal wall hernia or abnormality. There is moderate severity abdominopelvic ascites. This is increased in severity when compared to the prior study. Musculoskeletal: No acute or significant osseous findings. IMPRESSION: 1. Moderate severity bilateral multifocal infiltrates. 2. Interval single lead ventricular pacer placement since the prior exam. 3. Acute lateral second left rib fracture with an acute fracture of the anterior aspect of the mid to upper body of the sternum. 4. Chronic compression fracture deformity at the level of T9. 5. New compression fracture deformity involving the superior endplate of the T5 vertebral body. 6. Hepatic cirrhosis. 7. Moderate severity abdominopelvic ascites, increased in severity when compared to the prior study. 8. 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 9. Aortic atherosclerosis. Aortic Atherosclerosis (ICD10-I70.0).  Electronically Signed   By: TVirgina NorfolkM.D.   On: 03/04/2022 01:50   CT HEAD WO CONTRAST (5MM)  Result Date: 03/05/2022 CLINICAL DATA:  Recent fall with headaches and chest pain, initial encounter EXAM: CT HEAD WITHOUT CONTRAST CT CERVICAL SPINE WITHOUT CONTRAST TECHNIQUE: Multidetector CT imaging of the head and cervical spine was performed following the standard protocol without intravenous contrast. Multiplanar CT image reconstructions of  the cervical spine were also generated. RADIATION DOSE REDUCTION: This exam was performed according to the departmental dose-optimization program which includes automated exposure control, adjustment of the mA and/or kV according to patient size and/or use of iterative reconstruction technique. COMPARISON:  12/05/2021, 02/12/2022 FINDINGS: CT HEAD FINDINGS Brain: No evidence of acute infarction, hemorrhage, hydrocephalus, extra-axial collection or mass lesion/mass effect. Atrophic changes are noted. Vascular: No hyperdense vessel or unexpected calcification. Skull: Normal. Negative for fracture or focal lesion. Sinuses/Orbits: No acute finding. Other: None. CT CERVICAL SPINE FINDINGS Alignment: Loss of the normal cervical lordosis is noted. Skull base and vertebrae: 7 cervical segments are well visualized. Increased kyphosis is noted centered at the C6 level. Mild compression deformity at C7 is noted new from a prior CT from 02/12/2022. There is a mildly displaced fracture involving the left superior articular facet at C7. Additionally there is a fracture involving the left lamina at C6. Avulsion from the inferior most aspect of the left inferior articular facet is noted at C5. This is best seen on image number 39 of series 11. No other definitive fractures are seen. The odontoid is within normal limits. Mild osteophytic changes and facet hypertrophic changes are noted. Soft tissues and spinal canal: Surrounding soft tissue structures are within normal limits. No  focal hematoma is seen. Upper chest: Visualized lung apices demonstrate patchy alveolar opacities new from the prior CT examination. Early infiltrate cannot be totally excluded. Other: None IMPRESSION: CT of the head: Chronic atrophic changes without acute intracranial abnormality. CT of the cervical spine: Mild compression fracture at C7 new from the prior exam of 02/12/2022. Additionally there are fractures involving the superior articular facet on the left at C7 as well as the lamina on the left at C6. Small avulsion involving the inferior articular facet at C5 is noted as well. Patchy airspace opacities are noted in the apices bilaterally. Early infiltrate cannot be totally excluded. Critical Value/emergent results were called by telephone at the time of interpretation on 03/04/2022 at 11:53 pm to [facility], [place] , who verbally acknowledged these results. Electronically Signed   By: Inez Catalina M.D.   On: 02/21/2022 23:54   CT Cervical Spine Wo Contrast  Result Date: 02/07/2022 CLINICAL DATA:  Recent fall with headaches and chest pain, initial encounter EXAM: CT HEAD WITHOUT CONTRAST CT CERVICAL SPINE WITHOUT CONTRAST TECHNIQUE: Multidetector CT imaging of the head and cervical spine was performed following the standard protocol without intravenous contrast. Multiplanar CT image reconstructions of the cervical spine were also generated. RADIATION DOSE REDUCTION: This exam was performed according to the departmental dose-optimization program which includes automated exposure control, adjustment of the mA and/or kV according to patient size and/or use of iterative reconstruction technique. COMPARISON:  12/05/2021, 02/12/2022 FINDINGS: CT HEAD FINDINGS Brain: No evidence of acute infarction, hemorrhage, hydrocephalus, extra-axial collection or mass lesion/mass effect. Atrophic changes are noted. Vascular: No hyperdense vessel or unexpected calcification. Skull: Normal. Negative for fracture or focal lesion.  Sinuses/Orbits: No acute finding. Other: None. CT CERVICAL SPINE FINDINGS Alignment: Loss of the normal cervical lordosis is noted. Skull base and vertebrae: 7 cervical segments are well visualized. Increased kyphosis is noted centered at the C6 level. Mild compression deformity at C7 is noted new from a prior CT from 02/12/2022. There is a mildly displaced fracture involving the left superior articular facet at C7. Additionally there is a fracture involving the left lamina at C6. Avulsion from the inferior most aspect of the left inferior articular facet is noted  at C5. This is best seen on image number 39 of series 11. No other definitive fractures are seen. The odontoid is within normal limits. Mild osteophytic changes and facet hypertrophic changes are noted. Soft tissues and spinal canal: Surrounding soft tissue structures are within normal limits. No focal hematoma is seen. Upper chest: Visualized lung apices demonstrate patchy alveolar opacities new from the prior CT examination. Early infiltrate cannot be totally excluded. Other: None IMPRESSION: CT of the head: Chronic atrophic changes without acute intracranial abnormality. CT of the cervical spine: Mild compression fracture at C7 new from the prior exam of 02/12/2022. Additionally there are fractures involving the superior articular facet on the left at C7 as well as the lamina on the left at C6. Small avulsion involving the inferior articular facet at C5 is noted as well. Patchy airspace opacities are noted in the apices bilaterally. Early infiltrate cannot be totally excluded. Critical Value/emergent results were called by telephone at the time of interpretation on 02/06/2022 at 11:53 pm to [facility] , who verbally acknowledged these results. Electronically Signed   By: Inez Catalina M.D.   On: 02/20/2022 23:54   DG Chest 2 View  Result Date: 03/02/2022 CLINICAL DATA:  Defibrillator placement. EXAM: CHEST - 2 VIEW COMPARISON:  February 12, 2022. FINDINGS: The heart size and mediastinal contours are within normal limits. Left internal jugular Port-A-Cath is unchanged in position. Interval placement of defibrillator with single lead over cardiac shadow to left of thoracic spine. Minimal bibasilar subsegmental atelectasis is noted. The visualized skeletal structures are unremarkable. IMPRESSION: Interval placement of defibrillator with single lead over cardiac shadow. Minimal bibasilar subsegmental atelectasis. Electronically Signed   By: Marijo Conception M.D.   On: 03/02/2022 08:45    Procedures Procedures    Medications Ordered in ED Medications  sodium chloride flush (NS) 0.9 % injection 10-40 mL (10 mLs Intracatheter Given by Other 03/04/22 0119)  Chlorhexidine Gluconate Cloth 2 % PADS 6 each (has no administration in time range)  vancomycin (VANCOCIN) IVPB 1000 mg/200 mL premix (has no administration in time range)  ceFEPIme (MAXIPIME) 2 g in sodium chloride 0.9 % 100 mL IVPB (has no administration in time range)  iohexol (OMNIPAQUE) 350 MG/ML injection 75 mL (75 mLs Intravenous Contrast Given 03/04/22 0128)    ED Course/ Medical Decision Making/ A&P                           Medical Decision Making Patient fell between wheelchair and a hamper attempting to transfer in the dark at home tonight   Problems Addressed: Closed fracture of cervical vertebra, unspecified cervical vertebral level, initial encounter [facility]): acute illness or injury    Details: Stay in C collar, neurosurgery consulting on care.  Admitting to medicine  Fracture of body of sternum, initial encounter for closed fracture:    Details: Trauma surgery consulted for rib and sternal fractures  HCAP (healthcare-associated pneumonia):    Details: Cultures sent antibiotics initiated   Amount and/or Complexity of Data Reviewed External Data Reviewed: notes.    Details: Previous notes reviewed  Labs: ordered.    Details: All labs reviewed:  white count  elevated 12.1, hemoglobin low 9.4, normal platelet count.  Sodium low 130, normal potassium 3.6, creatinine elevated 1.18, elevated bilirubin 2.1  Radiology: ordered and independent interpretation performed.    Details: No head injury on CT by me, no solid organ injuries on CT by me.  B  PNA on CT by me  Discussion of management or test interpretation with external provider(s): Case d/w Dr. Dema Severin of trauma who will consult Case d/w Margo Aye of neurosurgery who will consult, patient to stay in cervical collar  Risk OTC drugs. Prescription drug management. Decision regarding hospitalization.  Critical Care Total time providing critical care: 30 minutes (Consults complexity of care and potential for serious outcomes )   CRITICAL CARE Performed by: Dailon Sheeran K Shaunna Rosetti-Rasch Total critical care time: 30 minutes Critical care time was exclusive of separately billable procedures and treating other patients. Critical care was necessary to treat or prevent imminent or life-threatening deterioration. Critical care was time spent personally by me on the following activities: development of treatment plan with patient and/or surrogate as well as nursing, discussions with consultants, evaluation of patient's response to treatment, examination of patient, obtaining history from patient or surrogate, ordering and performing treatments and interventions, ordering and review of laboratory studies, ordering and review of radiographic studies, pulse oximetry and re-evaluation of patient's condition.  Final Clinical Impression(s) / ED Diagnoses Final diagnoses:  Fall, initial encounter  Closed fracture of cervical vertebra, unspecified cervical vertebral level, initial encounter ([place])  HCAP (healthcare-associated pneumonia)  Compression fracture of thoracic vertebra, unspecified thoracic vertebral level, initial encounter ([place])  Fracture of body of sternum, initial encounter for closed fracture  Closed  fracture of one rib of left side, initial encounter   The patient appears reasonably stabilized for admission considering the current resources, flow, and capabilities available in the ED at this time, and I doubt any other [facility] requiring further screening and/or treatment in the ED prior to admission.   Rx / DC Orders ED Discharge Orders     None         Luceal Hollibaugh, MD 03/04/22 2878
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
## 2022-03-04 NOTE — Progress Notes (Signed)
Pharmacy Antibiotic Note  MAYRELI ALDEN is a 51 y.o. female admitted on 02/21/2022 with pneumonia.  Pharmacy has been consulted for vancomycin dosing.  Baseline SCr ~0.7, now 1.18.  Plan: Vancomycin '1250mg'$  IV x1; will f/u SCr trend prior to ordering further doses.  Height: '3\' 10"'$  (116.8 cm) Weight: 67.6 kg (149 lb) IBW/kg (Calculated) : 13.3  Temp (24hrs), Avg:99.6 F (37.6 C), Min:99.2 F (37.3 C), Max:99.9 F (37.7 C)  Recent Labs  Lab 03/02/2022 2250  WBC 12.1*  CREATININE 1.18*    Estimated Creatinine Clearance: 31.5 mL/min (A) (by C-G formula based on SCr of 1.18 mg/dL (H)).    Allergies  Allergen Reactions   Lorazepam     Prolongs QT   Zofran [Ondansetron]     Prolongs QT     Thank you for allowing pharmacy to be a part of this patient's care.  Wynona Neat, PharmD, BCPS  03/04/2022 4:04 AM

## 2022-03-04 NOTE — ED Notes (Signed)
Pt transported to CT ?

## 2022-03-04 NOTE — Progress Notes (Signed)
  Made aware of pts admission with recent S-ICD last week.   Wound site appears stable without bleeding or hematoma.   Interrogation performed by [employer] demonstrated stable sensing, no arrhythmias, and stable parameters.   Please call with any further questions.   Legrand Como [address]" [place], PA-C  03/04/2022 11:40 AM

## 2022-03-04 NOTE — ED Notes (Signed)
IV team at bedside 
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
## 2022-03-04 NOTE — Progress Notes (Signed)
[facility] PROGRESS NOTE   Mallory Ayers WLN:[id number] DOB: September 19, 1970 DOA: 02/06/2022  PCP: [name], MD  Brief History/Interval Summary: 51 y.o. 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  Patient then on 11/24  has PPM/ICD implanted without complication and was discharged home on 03/02/22. Patient at home had fall while completing wheel chair transfer s/p which she sustained trauma to her head and neck. Patient notes continued pain in neck as well as her chronic pain.  Imaging studies raised concern for cervical spine injury and fracture.  She was hospitalized for further management.  Consultants: Neurosurgery  Procedures: None yet    Subjective/Interval History: Patient complains of pain in the neck.  Denies any chest pain shortness of breath.  Has had a cough.  No fever.  No sick contacts.    Assessment/Plan:  Cervical spine injury secondary to mechanical fall Patient found to have mild compression fracture of C7.  Fracture of the articular facet on the left at C7 fracture of the lamina at C6.  Other injuries also noted on imaging studies. Patient currently has a soft cervical collar in place. Neurosurgery to see the patient this morning. Pain control.  Rib fracture/sternal fracture secondary to fall Trauma service to see the patient.  Healthcare associated pneumonia Patient found to have bilateral infiltrates on imaging studies.  She was recently hospitalized for ICD placement which was done on 11/24. She was intubated for this procedure. She could have aspirated. Will check COVID-19 influenza PCR's and respiratory viral panel. Patient placed on broad-spectrum antibiotics with cefepime and vancomycin.  Follow-up on blood  cultures.  Mild acute kidney injury with metabolic acidosis Renal function stable.  She was given IV fluids in the emergency department.  Continue to monitor urine output.  Avoid nephrotoxic agents.  Hyponatremia Continue to monitor.  Normocytic anemia No evidence for overt bleeding.  Continue to monitor.  Recent cardiac arrest/history of ventricular tachycardia She was on a LifeVest and then had ICD/pacemaker placed on 11/24 by cardiology. Echocardiogram from August showed normal left ventricular systolic function.  History of pulmonary embolism Eliquis was on hold for ICD placement.  Will resume it once we are sure that there is no plan for any surgical intervention.  Liver cirrhosis Patient aware that she has some form of liver disease.  She has been referred to a gastroenterologist but has not seen them yet.  This can be pursued in the outpatient setting.  History of breast cancer. She is s/p mastectomy.  Chemotherapy was in June 2023.  Has a left Port-A-Cath.  Follows with oncology in [place].  History of obstructive sleep apnea Not on CPAP.  Recent C. difficile infection Completed course of antibiotics about 2 weeks ago.  No diarrhea at this time.  Hypothyroidism Continue levothyroxine.  Chronic pain syndrome Takes oxycodone and Dilaudid at home for pain relief.  Bowel regimen.  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  DVT Prophylaxis: On apixaban Code Status: Full code Family Communication: Discussed with patient and her fianc Disposition Plan: Hopefully return home when improved  Status is: Inpatient Remains inpatient appropriate because: Cervical spine injury, rib fracture      Medications: Scheduled:  [START ON 03/05/2022] apixaban  5 mg Oral BID   aspirin  81 mg Oral Daily   busPIRone  10 mg Oral BID   Chlorhexidine Gluconate Cloth  6 each Topical Daily   FLUoxetine  60 mg Oral Daily    levothyroxine  100 mcg Oral Q0600   lidocaine  1 patch Transdermal Q24H   magnesium oxide  400 mg Oral Daily   mometasone-formoterol  2 puff Inhalation BID   multivitamin with minerals  1 tablet Oral q morning   potassium chloride SA  20 mEq Oral BID   pregabalin  225 mg Oral BID   saccharomyces boulardii  250 mg Oral BID   sodium chloride flush  10-40 mL Intracatheter Q12H   Continuous:  ceFEPime (MAXIPIME) IV     vancomycin     UQJ:[id number], methocarbamol, oxyCODONE  Antibiotics: Anti-infectives (From admission, onward)    Start     Dose/Rate Route Frequency Ordered Stop   03/04/22 2200  ceFEPIme (MAXIPIME) 2 g in sodium chloride 0.9 % 100 mL IVPB        2 g 200 mL/hr over 30 Minutes Intravenous Every 12 hours 03/04/22 0750     03/04/22 1800  vancomycin (VANCOREADY) IVPB 500 mg/100 mL        500 mg 100 mL/hr over 60 Minutes Intravenous Every 12 hours 03/04/22 0749     03/04/22 1000  ceFEPIme (MAXIPIME) 2 g in sodium chloride 0.9 % 100 mL IVPB  Status:  Discontinued        2 g 200 mL/hr over 30 Minutes Intravenous Every 8 hours 03/04/22 0354 03/04/22 0750   03/04/22 0403  vancomycin variable dose per unstable renal function (pharmacist dosing)  Status:  Discontinued         Does not apply See admin instructions 03/04/22 0403 03/04/22 0749   03/04/22 0400  vancomycin (VANCOREADY) IVPB 1250 mg/250 mL        1,250 mg 166.7 mL/hr over 90 Minutes Intravenous  Once 03/04/22 0357 03/04/22 0700   03/04/22 0200  vancomycin (VANCOCIN) IVPB 1000 mg/200 mL premix  Status:  Discontinued        1,000 mg 200 mL/hr over 60 Minutes Intravenous  Once 03/04/22 0156 03/04/22 0357   03/04/22 0200  ceFEPIme (MAXIPIME) 2 g in sodium chloride 0.9 % 100 mL IVPB        2 g 200 mL/hr over 30 Minutes Intravenous STAT 03/04/22 0156 03/04/22 0442       Objective:  Vital Signs  Vitals:   03/04/22 0600 03/04/22 0715 03/04/22 0815 03/04/22 0917  BP: 100/65 108/74 119/79   Pulse: 97 (!) 102  (!) 105   Resp: '19 19 18   '$ Temp:    98.1 F (36.7 C)  TempSrc:    Oral  SpO2: 94% 96% 94%   Weight:      Height:        Intake/Output Summary (Last 24 hours) at 03/04/2022 0926 Last data filed at 03/04/2022 0700 Gross per 24 hour  Intake 350 ml  Output --  Net 350 ml   Filed Weights   02/11/2022 2236  Weight: 67.6 kg    General appearance: Awake alert.  In no distress Resp: Normal effort at rest.  Few crackles bilaterally.  No wheezing or rhonchi. Cardio: S1-S2 is normal regular.  No S3-S4.  No rubs murmurs or bruit GI: Abdomen is soft.  Nontender nondistended.  Bowel sounds are present normal.  No masses organomegaly Extremities: Bilateral AKA Neurologic: Alert and oriented x3.  No focal neurological deficits.    Lab Results:  Data Reviewed: I have personally reviewed following labs and reports of the imaging studies  CBC: Recent Labs  Lab 02/10/2022 2250 03/04/22 0450 03/04/22 0503  WBC 12.1* 9.5  --   NEUTROABS 9.8* 7.5  --   HGB 9.4* 8.5* 8.5*  HCT 27.5* 25.6* 25.0*  MCV 94.2 96.2  --   PLT 160 131*  --     Basic Metabolic Panel: Recent Labs  Lab 02/18/2022 2250 03/04/22 0450 03/04/22 0503  NA 130* 129* 128*  K 3.6 3.5 3.5  CL 97* 100  --   CO2 17* 18*  --   GLUCOSE 127* 124*  --   BUN 8 10  --   CREATININE 1.18* 1.14*  --   CALCIUM 8.1* 7.5*  --   MG  --  1.7  --     GFR: Estimated Creatinine Clearance: 32.6 mL/min (A) (by C-G formula based on SCr of 1.14 mg/dL (H)).  Liver Function Tests: Recent Labs  Lab 02/26/2022 2250 03/04/22 0450  AST 91* 82*  ALT 12 11  ALKPHOS 229* 232*  BILITOT 2.1* 1.8*  PROT 5.8* 5.4*  ALBUMIN 2.1* 1.9*     Recent Results (from the past 240 hour(s))  Respiratory (~20 pathogens) panel by PCR     Status: None   Collection Time: 03/04/22  5:05 AM   Specimen: Nasopharyngeal Swab; Respiratory  Result Value Ref Range Status   Adenovirus NOT DETECTED NOT DETECTED Final   Coronavirus 229E NOT DETECTED NOT  DETECTED Final    Comment: (NOTE) The Coronavirus on the Respiratory Panel, DOES NOT test for the novel  Coronavirus (2019 nCoV)    Coronavirus HKU1 NOT DETECTED NOT DETECTED Final   Coronavirus NL63 NOT DETECTED NOT DETECTED Final   Coronavirus OC43 NOT DETECTED NOT DETECTED Final   Metapneumovirus NOT DETECTED NOT DETECTED Final   Rhinovirus / Enterovirus NOT DETECTED NOT DETECTED Final   Influenza A NOT DETECTED NOT DETECTED Final   Influenza B NOT DETECTED NOT DETECTED Final   Parainfluenza Virus 1 NOT DETECTED NOT DETECTED Final   Parainfluenza Virus 2 NOT DETECTED NOT DETECTED Final   Parainfluenza Virus 3 NOT DETECTED NOT DETECTED Final   Parainfluenza Virus 4 NOT DETECTED NOT DETECTED Final   Respiratory Syncytial Virus NOT DETECTED NOT DETECTED Final   Bordetella pertussis NOT DETECTED NOT DETECTED Final   Bordetella Parapertussis NOT DETECTED NOT DETECTED Final   Chlamydophila pneumoniae NOT DETECTED NOT DETECTED Final   Mycoplasma pneumoniae NOT DETECTED NOT DETECTED Final    Comment: Performed at [facility], [place]. [address], [place], [place] [address]      Radiology Studies: CT CHEST ABDOMEN PELVIS W CONTRAST  Result Date: 03/04/2022 CLINICAL DATA:  Status post fall. EXAM: CT CHEST, ABDOMEN, AND PELVIS WITH CONTRAST TECHNIQUE: Multidetector CT imaging of the chest, abdomen and pelvis was performed following the standard protocol during bolus administration of intravenous contrast. RADIATION DOSE REDUCTION: This exam was performed according to the departmental dose-optimization program which includes automated exposure control, adjustment of the mA and/or kV according to patient size and/or use of iterative reconstruction technique. CONTRAST:  10m OMNIPAQUE IOHEXOL 350 MG/ML SOLN COMPARISON:  February 12, 2022 FINDINGS: CT CHEST FINDINGS Cardiovascular: There is stable left-sided venous Port-A-Cath positioning. Mild calcification of the aortic arch is noted, without  evidence of aortic aneurysm or dissection. Normal heart size with marked severity coronary artery calcification. No pericardial effusion. Mediastinum/Nodes: No enlarged  mediastinal, hilar, or axillary lymph nodes. Thyroid gland, trachea, and esophagus demonstrate no significant findings. Lungs/Pleura: Moderate severity multifocal infiltrates are seen throughout both lungs. This is slightly more prominent on the left and represents a new finding when compared to the prior study. There is no evidence of a pleural effusion or pneumothorax. Musculoskeletal: A mild amount of subcutaneous air is seen anterior to the body of the sternum, to the left of midline. A mild amount of soft tissue air is also seen along the posterior and lateral aspects of the lower left ribcage. A single lead ventricular pacer is noted within the posterolateral aspect of the lower left chest wall. This represents a new finding when compared to the prior study. Numerous chronic bilateral rib fractures are seen. A new lateral second left rib fracture is noted (axial CT image 26, CT series 6). A chronic fracture is seen involving the inferior aspect of the body of the sternum. A new fracture of the anterior aspect of the mid to upper body of the sternum is noted. There is a chronic compression fracture deformity at the level of T9. A new compression fracture deformity is seen involving predominantly the superior endplate of the T5 vertebral body. CT ABDOMEN PELVIS FINDINGS Hepatobiliary: The liver is cirrhotic, with diffuse heterogeneous appearance of the liver parenchyma. Status post cholecystectomy. No biliary dilatation. Pancreas: Unremarkable. No pancreatic ductal dilatation or surrounding inflammatory changes. Spleen: Normal in size without focal abnormality. Adrenals/Urinary Tract: Adrenal glands are unremarkable. Kidneys are normal, without renal calculi, focal lesion, or hydronephrosis. The urinary bladder is markedly distended and is  otherwise unremarkable. Stomach/Bowel: Surgical sutures are seen throughout the gastric region. The appendix is not clearly identified. No evidence of bowel wall thickening, distention, or inflammatory changes. Vascular/Lymphatic: Aortic atherosclerosis. No enlarged abdominal or pelvic lymph nodes. Reproductive: Uterus and bilateral adnexa are unremarkable. Other: No abdominal wall hernia or abnormality. There is moderate severity abdominopelvic ascites. This is increased in severity when compared to the prior study. Musculoskeletal: No acute or significant osseous findings. IMPRESSION: 1. Moderate severity bilateral multifocal infiltrates. 2. Interval single lead ventricular pacer placement since the prior exam. 3. Acute lateral second left rib fracture with an acute fracture of the anterior aspect of the mid to upper body of the sternum. 4. Chronic compression fracture deformity at the level of T9. 5. New compression fracture deformity involving the superior endplate of the T5 vertebral body. 6. Hepatic cirrhosis. 7. Moderate severity abdominopelvic ascites, increased in severity when compared to the prior study. 8. 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 9. Aortic atherosclerosis. Aortic Atherosclerosis (ICD10-I70.0). Electronically Signed   By: Virgina Norfolk M.D.   On: 03/04/2022 01:50   CT HEAD WO CONTRAST (5MM)  Result Date: 02/06/2022 CLINICAL DATA:  Recent fall with headaches and chest pain, initial encounter EXAM: CT HEAD WITHOUT CONTRAST CT CERVICAL SPINE WITHOUT CONTRAST TECHNIQUE: Multidetector CT imaging of the head and cervical spine was performed following the standard protocol without intravenous contrast. Multiplanar CT image reconstructions of the cervical spine were also generated. RADIATION DOSE REDUCTION: This exam was performed according to the departmental dose-optimization program which includes automated exposure control, adjustment of the mA and/or kV  according to patient size and/or use of iterative reconstruction technique. COMPARISON:  12/05/2021, 02/12/2022 FINDINGS: CT HEAD FINDINGS Brain: No evidence of acute infarction, hemorrhage, hydrocephalus, extra-axial collection or mass lesion/mass effect. Atrophic changes are noted. Vascular: No hyperdense vessel or unexpected calcification. Skull: Normal. Negative for fracture or focal lesion.  Sinuses/Orbits: No acute finding. Other: None. CT CERVICAL SPINE FINDINGS Alignment: Loss of the normal cervical lordosis is noted. Skull base and vertebrae: 7 cervical segments are well visualized. Increased kyphosis is noted centered at the C6 level. Mild compression deformity at C7 is noted new from a prior CT from 02/12/2022. There is a mildly displaced fracture involving the left superior articular facet at C7. Additionally there is a fracture involving the left lamina at C6. Avulsion from the inferior most aspect of the left inferior articular facet is noted at C5. This is best seen on image number 39 of series 11. No other definitive fractures are seen. The odontoid is within normal limits. Mild osteophytic changes and facet hypertrophic changes are noted. Soft tissues and spinal canal: Surrounding soft tissue structures are within normal limits. No focal hematoma is seen. Upper chest: Visualized lung apices demonstrate patchy alveolar opacities new from the prior CT examination. Early infiltrate cannot be totally excluded. Other: None IMPRESSION: CT of the head: Chronic atrophic changes without acute intracranial abnormality. CT of the cervical spine: Mild compression fracture at C7 new from the prior exam of 02/12/2022. Additionally there are fractures involving the superior articular facet on the left at C7 as well as the lamina on the left at C6. Small avulsion involving the inferior articular facet at C5 is noted as well. Patchy airspace opacities are noted in the apices bilaterally. Early infiltrate cannot be  totally excluded. Critical Value/emergent results were called by telephone at the time of interpretation on 02/16/2022 at 11:53 pm to [facility] , who verbally acknowledged these results. Electronically Signed   By: Inez Catalina M.D.   On: 02/15/2022 23:54   CT Cervical Spine Wo Contrast  Result Date: 02/06/2022 CLINICAL DATA:  Recent fall with headaches and chest pain, initial encounter EXAM: CT HEAD WITHOUT CONTRAST CT CERVICAL SPINE WITHOUT CONTRAST TECHNIQUE: Multidetector CT imaging of the head and cervical spine was performed following the standard protocol without intravenous contrast. Multiplanar CT image reconstructions of the cervical spine were also generated. RADIATION DOSE REDUCTION: This exam was performed according to the departmental dose-optimization program which includes automated exposure control, adjustment of the mA and/or kV according to patient size and/or use of iterative reconstruction technique. COMPARISON:  12/05/2021, 02/12/2022 FINDINGS: CT HEAD FINDINGS Brain: No evidence of acute infarction, hemorrhage, hydrocephalus, extra-axial collection or mass lesion/mass effect. Atrophic changes are noted. Vascular: No hyperdense vessel or unexpected calcification. Skull: Normal. Negative for fracture or focal lesion. Sinuses/Orbits: No acute finding. Other: None. CT CERVICAL SPINE FINDINGS Alignment: Loss of the normal cervical lordosis is noted. Skull base and vertebrae: 7 cervical segments are well visualized. Increased kyphosis is noted centered at the C6 level. Mild compression deformity at C7 is noted new from a prior CT from 02/12/2022. There is a mildly displaced fracture involving the left superior articular facet at C7. Additionally there is a fracture involving the left lamina at C6. Avulsion from the inferior most aspect of the left inferior articular facet is noted at C5. This is best seen on image number 39 of series 11. No other definitive fractures are seen. The  odontoid is within normal limits. Mild osteophytic changes and facet hypertrophic changes are noted. Soft tissues and spinal canal: Surrounding soft tissue structures are within normal limits. No focal hematoma is seen. Upper chest: Visualized lung apices demonstrate patchy alveolar opacities new from the prior CT examination. Early infiltrate cannot be totally excluded. Other: None IMPRESSION: CT of the head: Chronic  atrophic changes without acute intracranial abnormality. CT of the cervical spine: Mild compression fracture at C7 new from the prior exam of 02/12/2022. Additionally there are fractures involving the superior articular facet on the left at C7 as well as the lamina on the left at C6. Small avulsion involving the inferior articular facet at C5 is noted as well. Patchy airspace opacities are noted in the apices bilaterally. Early infiltrate cannot be totally excluded. Critical Value/emergent results were called by telephone at the time of interpretation on 02/14/2022 at 11:53 pm to [facility] , who verbally acknowledged these results. Electronically Signed   By: Inez Catalina M.D.   On: 03/02/2022 23:54       LOS: 0 days   [facility] Pager on [email or web address]  03/04/2022, 9:26 AM

## 2022-03-04 NOTE — Progress Notes (Signed)
CT scans reviewed which showed a mild T5 compression fracture and a C7 compression fracture that invovle the superior articular face on the left and the lamina on the left at C6. Aspen collar for now. Formal consult to follow
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
## 2022-03-05 DIAGNOSIS — J189 Pneumonia, unspecified organism: Secondary | ICD-10-CM | POA: Diagnosis not present

## 2022-03-05 DIAGNOSIS — S129XXA Fracture of neck, unspecified, initial encounter: Secondary | ICD-10-CM | POA: Diagnosis not present

## 2022-03-05 DIAGNOSIS — S2222XA Fracture of body of sternum, initial encounter for closed fracture: Secondary | ICD-10-CM | POA: Diagnosis not present

## 2022-03-05 DIAGNOSIS — S2232XA Fracture of one rib, left side, initial encounter for closed fracture: Secondary | ICD-10-CM | POA: Diagnosis not present

## 2022-03-05 LAB — I-STAT VENOUS BLOOD GAS, ED
Acid-base deficit: 5 mmol/L — ABNORMAL HIGH (ref 0.0–2.0)
Bicarbonate: 18.2 mmol/L — ABNORMAL LOW (ref 20.0–28.0)
Calcium, Ion: 1.04 mmol/L — ABNORMAL LOW (ref 1.15–1.40)
HCT: 27 % — ABNORMAL LOW (ref 36.0–46.0)
Hemoglobin: 9.2 g/dL — ABNORMAL LOW (ref 12.0–15.0)
O2 Saturation: 96 %
Potassium: 4.5 mmol/L (ref 3.5–5.1)
Sodium: 128 mmol/L — ABNORMAL LOW (ref 135–145)
TCO2: 19 mmol/L — ABNORMAL LOW (ref 22–32)
pCO2, Ven: 26.4 mmHg — ABNORMAL LOW (ref 44–60)
pH, Ven: 7.448 — ABNORMAL HIGH (ref 7.25–7.43)
pO2, Ven: 78 mmHg — ABNORMAL HIGH (ref 32–45)

## 2022-03-05 LAB — AMMONIA: Ammonia: 58 umol/L — ABNORMAL HIGH (ref 9–35)

## 2022-03-05 LAB — LEGIONELLA PNEUMOPHILA SEROGP 1 UR AG: L. pneumophila Serogp 1 Ur Ag: NEGATIVE

## 2022-03-05 LAB — VITAMIN B12: Vitamin B-12: 515 pg/mL (ref 180–914)

## 2022-03-05 MED ORDER — SODIUM CHLORIDE 0.9 % IV SOLN
INTRAVENOUS | Status: DC
  Administered 2022-03-05: 10 mL/h via INTRAVENOUS

## 2022-03-05 MED ORDER — PNEUMOCOCCAL VAC POLYVALENT 25 MCG/0.5ML IJ INJ
0.5000 mL | INJECTION | INTRAMUSCULAR | Status: DC
  Filled 2022-03-05: qty 0.5

## 2022-03-05 MED ORDER — ORAL CARE MOUTH RINSE: 15.0000 mL | OROMUCOSAL | Status: DC | PRN

## 2022-03-05 MED ORDER — INFLUENZA VAC SPLIT QUAD 0.5 ML IM SUSY
0.5000 mL | PREFILLED_SYRINGE | INTRAMUSCULAR | Status: DC
  Filled 2022-03-05: qty 0.5

## 2022-03-05 MED ORDER — LACTULOSE 10 GM/15ML PO SOLN
20.0000 g | Freq: Two times a day (BID) | ORAL | Status: DC
  Administered 2022-03-05 (×2): 20 g via ORAL
  Filled 2022-03-05 (×3): qty 30

## 2022-03-05 MED ORDER — AMOXICILLIN-POT CLAVULANATE 875-125 MG PO TABS
1.0000 | ORAL_TABLET | Freq: Two times a day (BID) | ORAL | Status: DC
  Administered 2022-03-05 (×2): 1 via ORAL
  Filled 2022-03-05 (×3): qty 1

## 2022-03-05 NOTE — ED Notes (Signed)
Pt sitting straight up in bed with Aspen collar on.  Husband at bedside. Purwick in place

## 2022-03-05 NOTE — Plan of Care (Signed)
Pt arrived to the unit. VS stable, RA. Belongings (phone, wheelchair, clothes) and family at the bedside. Education provided. Bed in low position, alarms are on, call bell in reach.

## 2022-03-05 NOTE — ED Notes (Signed)
Pt husband came out to desk saying that pt coordination is worse now than it has been.

## 2022-03-05 NOTE — ED Notes (Signed)
While giving pt nighttime medication, pt is having a hard time holding the cup up to mouth with some coordination issue. Per pt husband this has been the case since her fall.

## 2022-03-05 NOTE — Progress Notes (Signed)
[facility] night cross cover note:  Patient c/o symmetrical diminished coordination involving the b/l upper extremities during night shift. No associated with any acute focal weakness, acute focal sensory change, or any other acute focal neuro deficit.   Was admitted with acute cervical fx's on 11/26. Neurosurgery following, and conveys no indication for surgical intervention at this time. I Have ordered serial neuro checks and expanded lab work-up to eval for any metabolic contribution, including checking B1, B12, vbg, ammonia.     Babs Bertin, DO Hospitalist

## 2022-03-05 NOTE — ED Notes (Signed)
Daughter/poa Alric Seton [phone] would like an update immediately

## 2022-03-05 NOTE — Progress Notes (Signed)
[facility] PROGRESS NOTE   Mallory Ayers QQV:[id number] DOB: 1971/03/29 DOA: 02/12/2022  PCP: [name], MD  Brief History/Interval Summary: 51 y.o. 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  Patient then on 11/24  has PPM/ICD implanted without complication and was discharged home on 03/02/22. Patient at home had fall while completing wheel chair transfer s/p which she sustained trauma to her head and neck. Patient notes continued pain in neck as well as her chronic pain.  Imaging studies raised concern for cervical spine injury and fracture.  She was hospitalized for further management.  Consultants: Neurosurgery  Procedures: None yet    Subjective/Interval History: Overnight events noted.  She was noted to be tremulous with poor coordination while trying to pick things with her hand.  Denies any worsening pain in the neck.  No further falls or injuries.  Her fianc is at the bedside.     Assessment/Plan:  Cervical spine injury secondary to mechanical fall Patient found to have mild compression fracture of C7.  Fracture of the articular facet on the left at C7 fracture of the lamina at C6.  Other injuries also noted on imaging studies. Patient currently has a soft cervical collar in place. Patient seen by neurosurgery.  No plans for surgical intervention.  Continue with cervical collar.  They would like to follow the patient in their office in 2 weeks time. Pain control. Patient reports generalized weakness and a lot of difficulty with transferring.  Will have PT and OT evaluate patient.  Patient and fianc requesting rehab.  Rib fracture/sternal fracture secondary to fall Seen by trauma service.  No interventions recommended.  Pain control.   Incentive spirometry.  Healthcare associated pneumonia Patient found to have bilateral infiltrates on imaging studies.  She was recently hospitalized for ICD placement which was done on 11/24. She was intubated for this procedure. She could have aspirated. COVID-19 influenza PCR negative.  Respiratory viral panel negative. MRSA PCR negative. Will change her over to Augmentin today.  Blood cultures negative so far. She is noted to be off oxygen this morning.  Hepatic encephalopathy Patient noted to have asterixis.  This could have contributed to lack of coordination while picking up things.  Ammonia level is noted to be elevated at 58.  Will start her on lactulose.  Recheck labs tomorrow.  Mild acute kidney injury with metabolic acidosis She was given IV fluids in the emergency department.  Continue to monitor urine output.  Avoid nephrotoxic agents. Recheck labs tomorrow.  Hyponatremia Sodium level has been stable.  Likely due to liver cirrhosis.  Recheck labs tomorrow.  Normocytic anemia No evidence for overt bleeding.  Continue to monitor.  Recheck labs tomorrow.  Recent cardiac arrest/history of ventricular tachycardia She was on a LifeVest and then had ICD/pacemaker placed on 11/24 by cardiology. Echocardiogram from August showed normal left ventricular systolic function.  History of pulmonary embolism Eliquis was on hold for ICD placement.  Okay to resume Eliquis.  Liver cirrhosis Patient aware that she has some form of liver disease.  She has been referred to a gastroenterologist but has not seen them yet.  This can be pursued in the outpatient setting.  History of breast cancer. She is s/p mastectomy.  Chemotherapy was in June  2023.  Has a left Port-A-Cath.  Follows with oncology in [place].  History of obstructive sleep apnea Not on CPAP.  Recent C. difficile infection Completed course of antibiotics about 2 weeks ago.  No diarrhea at this  time.  Hypothyroidism Continue levothyroxine.  Chronic pain syndrome Takes oxycodone and Dilaudid at home for pain relief.  Bowel regimen.  Has been started on lactulose.  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  DVT Prophylaxis: On apixaban Code Status: Full code Family Communication: Discussed with patient and her fianc Disposition Plan: Hopefully return home when improved  Status is: Inpatient Remains inpatient appropriate because: Cervical spine injury, rib fracture      Medications: Scheduled:  amoxicillin-clavulanate  1 tablet Oral Q12H   apixaban  5 mg Oral BID   aspirin  81 mg Oral Daily   busPIRone  10 mg Oral BID   Chlorhexidine Gluconate Cloth  6 each Topical Daily   FLUoxetine  60 mg Oral Daily   lactulose  20 g Oral BID   levothyroxine  100 mcg Oral Q0600   lidocaine  1 patch Transdermal Q24H   magnesium oxide  400 mg Oral Daily   mometasone-formoterol  2 puff Inhalation BID   multivitamin with minerals  1 tablet Oral q morning   potassium chloride SA  20 mEq Oral BID   pregabalin  225 mg Oral BID   saccharomyces boulardii  250 mg Oral BID   sodium chloride flush  10-40 mL Intracatheter Q12H   Continuous:   YIR:[id number], methocarbamol, oxyCODONE  Antibiotics: Anti-infectives (From admission, onward)    Start     Dose/Rate Route Frequency Ordered Stop   03/05/22 1000  amoxicillin-clavulanate (AUGMENTIN) 875-125 MG per tablet 1 tablet        1 tablet Oral Every 12 hours 03/05/22 0832     03/04/22 2200  ceFEPIme (MAXIPIME) 2 g in sodium chloride 0.9 % 100 mL IVPB  Status:  Discontinued        2 g 200 mL/hr over 30 Minutes Intravenous Every 12 hours 03/04/22 0750 03/05/22 0832   03/04/22 1800  vancomycin (VANCOREADY) IVPB 500 mg/100 mL  Status:  Discontinued        500 mg 100 mL/hr over 60 Minutes Intravenous Every 12 hours 03/04/22 0749 03/05/22 0832   03/04/22 1000  ceFEPIme (MAXIPIME) 2  g in sodium chloride 0.9 % 100 mL IVPB  Status:  Discontinued        2 g 200 mL/hr over 30 Minutes Intravenous Every 8 hours 03/04/22 0354 03/04/22 0750   03/04/22 0403  vancomycin variable dose per unstable renal function (pharmacist dosing)  Status:  Discontinued         Does not apply See admin instructions 03/04/22 0403 03/04/22 0749   03/04/22 0400  vancomycin (VANCOREADY) IVPB 1250 mg/250 mL        1,250 mg 166.7 mL/hr over 90 Minutes Intravenous  Once 03/04/22 0357 03/04/22 0700   03/04/22 0200  vancomycin (VANCOCIN) IVPB 1000 mg/200 mL premix  Status:  Discontinued        1,000 mg 200 mL/hr over 60 Minutes Intravenous  Once 03/04/22 0156 03/04/22 0357   03/04/22 0200  ceFEPIme (MAXIPIME) 2 g in sodium chloride 0.9 % 100 mL IVPB        2 g 200 mL/hr over 30 Minutes Intravenous STAT 03/04/22 0156 03/04/22 0442       Objective:  Vital Signs  Vitals:   03/05/22 0500 03/05/22 0600 03/05/22 0630 03/05/22 0634  BP: 100/67 109/65 117/77   Pulse: 95 93 92   Resp: 14 (!) 21 18   Temp:    98 F (36.7 C)  TempSrc:    Oral  SpO2: 97% 96% 95%   Weight:      Height:       No intake or output data in the 24 hours ending 03/05/22 0850  Filed Weights   02/24/2022 2236  Weight: 67.6 kg    General appearance: Awake alert.  In no distress Patient noted to be in cervical collar Resp: Normal effort at rest.  Few crackles bilaterally.  No wheezing or rhonchi. Cardio: S1-S2 is normal regular.  No S3-S4.  No rubs murmurs or bruit GI: Abdomen is soft.  Nontender nondistended.  Bowel sounds are present normal.  No masses organomegaly Extremities: Status post bilateral AKA Neurologic: Alert and oriented x3.  No focal neurological deficits.  Asterixis noted.   Lab Results:  Data Reviewed: I have personally reviewed following labs and reports of the imaging studies  CBC: Recent Labs  Lab 03/05/2022 2250 03/04/22 0450 03/04/22 0503 03/05/22 0413  WBC 12.1* 9.5  --   --   NEUTROABS  9.8* 7.5  --   --   HGB 9.4* 8.5* 8.5* 9.2*  HCT 27.5* 25.6* 25.0* 27.0*  MCV 94.2 96.2  --   --   PLT 160 131*  --   --      Basic Metabolic Panel: Recent Labs  Lab 03/05/2022 2250 03/04/22 0450 03/04/22 0503 03/05/22 0413  NA 130* 129* 128* 128*  K 3.6 3.5 3.5 4.5  CL 97* 100  --   --   CO2 17* 18*  --   --   GLUCOSE 127* 124*  --   --   BUN 8 10  --   --   CREATININE 1.18* 1.14*  --   --   CALCIUM 8.1* 7.5*  --   --   MG  --  1.7  --   --      GFR: Estimated Creatinine Clearance: 32.6 mL/min (A) (by C-G formula based on SCr of 1.14 mg/dL (H)).  Liver Function Tests: Recent Labs  Lab 02/10/2022 2250 03/04/22 0450  AST 91* 82*  ALT 12 11  ALKPHOS 229* 232*  BILITOT 2.1* 1.8*  PROT 5.8* 5.4*  ALBUMIN 2.1* 1.9*      Recent Results (from the past 240 hour(s))  Blood culture (routine x 2)     Status: None (Preliminary result)   Collection Time: 03/04/22  2:30 AM   Specimen: BLOOD  Result Value Ref Range Status   Specimen Description BLOOD LEFT ARM  Final   Special Requests   Final    BOTTLES DRAWN AEROBIC AND ANAEROBIC Blood Culture results may not be optimal due to an inadequate volume of blood received in culture bottles   Culture   Final    NO GROWTH 1 DAY Performed at [facility], [place] [address] [place], [place] [address]    Report Status PENDING  Incomplete  Blood culture (routine x 2)     Status: None (Preliminary result)   Collection Time: 03/04/22  2:30 AM   Specimen: BLOOD  Result Value Ref Range Status   Specimen Description BLOOD LEFT FOREARM  Final   Special Requests   Final    BOTTLES DRAWN AEROBIC AND ANAEROBIC Blood Culture results may not be optimal due to  an inadequate volume of blood received in culture bottles   Culture   Final    NO GROWTH 1 DAY Performed at [facility], [place] [address], [place], [place] [address]    Report Status PENDING  Incomplete  Respiratory (~20 pathogens) panel by PCR     Status: None    Collection Time: 03/04/22  5:05 AM   Specimen: Nasopharyngeal Swab; Respiratory  Result Value Ref Range Status   Adenovirus NOT DETECTED NOT DETECTED Final   Coronavirus 229E NOT DETECTED NOT DETECTED Final    Comment: (NOTE) The Coronavirus on the Respiratory Panel, DOES NOT test for the novel  Coronavirus (2019 nCoV)    Coronavirus HKU1 NOT DETECTED NOT DETECTED Final   Coronavirus NL63 NOT DETECTED NOT DETECTED Final   Coronavirus OC43 NOT DETECTED NOT DETECTED Final   Metapneumovirus NOT DETECTED NOT DETECTED Final   Rhinovirus / Enterovirus NOT DETECTED NOT DETECTED Final   Influenza A NOT DETECTED NOT DETECTED Final   Influenza B NOT DETECTED NOT DETECTED Final   Parainfluenza Virus 1 NOT DETECTED NOT DETECTED Final   Parainfluenza Virus 2 NOT DETECTED NOT DETECTED Final   Parainfluenza Virus 3 NOT DETECTED NOT DETECTED Final   Parainfluenza Virus 4 NOT DETECTED NOT DETECTED Final   Respiratory Syncytial Virus NOT DETECTED NOT DETECTED Final   Bordetella pertussis NOT DETECTED NOT DETECTED Final   Bordetella Parapertussis NOT DETECTED NOT DETECTED Final   Chlamydophila pneumoniae NOT DETECTED NOT DETECTED Final   Mycoplasma pneumoniae NOT DETECTED NOT DETECTED Final    Comment: Performed at [facility], [place]. [address], [place], [place] [address]  MRSA Next Gen by PCR, Nasal     Status: None   Collection Time: 03/04/22  5:07 PM   Specimen: Nasal Mucosa; Nasal Swab  Result Value Ref Range Status   MRSA by PCR Next Gen NOT DETECTED NOT DETECTED Final    Comment: (NOTE) The GeneXpert MRSA Assay (FDA approved for NASAL specimens only), is one component of a comprehensive MRSA colonization surveillance program. It is not intended to diagnose MRSA infection nor to guide or monitor treatment for MRSA infections. Test performance is not FDA approved in patients less than 54 years old. Performed at [facility], [place] [address] [place], [place] [address]   Resp Panel  by RT-PCR (Flu A&B, Covid) Nasal Mucosa     Status: None   Collection Time: 03/04/22  5:40 PM   Specimen: Nasal Mucosa; Nasal Swab  Result Value Ref Range Status   SARS Coronavirus 2 by RT PCR NEGATIVE NEGATIVE Final    Comment: (NOTE) SARS-CoV-2 target nucleic acids are NOT DETECTED.  The SARS-CoV-2 RNA is generally detectable in upper respiratory specimens during the acute phase of infection. The lowest concentration of SARS-CoV-2 viral copies this assay can detect is 138 copies/mL. A negative result does not preclude SARS-Cov-2 infection and should not be used as the sole basis for treatment or other patient management decisions. A negative result may occur with  improper specimen collection/handling, submission of specimen other than nasopharyngeal swab, presence of viral mutation(s) within the areas targeted by this assay, and inadequate number of viral copies(<138 copies/mL). A negative result must be combined with clinical observations, patient history, and epidemiological information. The expected result is Negative.  Fact Sheet for Patients:  EntrepreneurPulse.com.au  Fact Sheet for Healthcare Providers:  IncredibleEmployment.be  This test is no t yet approved or cleared by the [place] FDA and  has  been authorized for detection and/or diagnosis of SARS-CoV-2 by FDA under an Emergency Use Authorization (EUA). This EUA will remain  in effect (meaning this test can be used) for the duration of the COVID-19 declaration under Section 564(b)(1) of the Act, 21 U.S.C.section 360bbb-3(b)(1), unless the authorization is terminated  or revoked sooner.       Influenza A by PCR NEGATIVE NEGATIVE Final   Influenza B by PCR NEGATIVE NEGATIVE Final    Comment: (NOTE) The Xpert Xpress SARS-CoV-2/FLU/RSV plus assay is intended as an aid in the diagnosis of influenza from Nasopharyngeal swab specimens and should not be used as a sole basis for  treatment. Nasal washings and aspirates are unacceptable for Xpert Xpress SARS-CoV-2/FLU/RSV testing.  Fact Sheet for Patients: EntrepreneurPulse.com.au  Fact Sheet for Healthcare Providers: IncredibleEmployment.be  This test is not yet approved or cleared by the [place] FDA and has been authorized for detection and/or diagnosis of SARS-CoV-2 by FDA under an Emergency Use Authorization (EUA). This EUA will remain in effect (meaning this test can be used) for the duration of the COVID-19 declaration under Section 564(b)(1) of the Act, 21 U.S.C. section 360bbb-3(b)(1), unless the authorization is terminated or revoked.  Performed at [facility], [place] [address], [place], [place] [address]       Radiology Studies: CT CHEST ABDOMEN PELVIS W CONTRAST  Result Date: 03/04/2022 CLINICAL DATA:  Status post fall. EXAM: CT CHEST, ABDOMEN, AND PELVIS WITH CONTRAST TECHNIQUE: Multidetector CT imaging of the chest, abdomen and pelvis was performed following the standard protocol during bolus administration of intravenous contrast. RADIATION DOSE REDUCTION: This exam was performed according to the departmental dose-optimization program which includes automated exposure control, adjustment of the mA and/or kV according to patient size and/or use of iterative reconstruction technique. CONTRAST:  72m OMNIPAQUE IOHEXOL 350 MG/ML SOLN COMPARISON:  February 12, 2022 FINDINGS: CT CHEST FINDINGS Cardiovascular: There is stable left-sided venous Port-A-Cath positioning. Mild calcification of the aortic arch is noted, without evidence of aortic aneurysm or dissection. Normal heart size with marked severity coronary artery calcification. No pericardial effusion. Mediastinum/Nodes: No enlarged mediastinal, hilar, or axillary lymph nodes. Thyroid gland, trachea, and esophagus demonstrate no significant findings. Lungs/Pleura: Moderate severity multifocal infiltrates are  seen throughout both lungs. This is slightly more prominent on the left and represents a new finding when compared to the prior study. There is no evidence of a pleural effusion or pneumothorax. Musculoskeletal: A mild amount of subcutaneous air is seen anterior to the body of the sternum, to the left of midline. A mild amount of soft tissue air is also seen along the posterior and lateral aspects of the lower left ribcage. A single lead ventricular pacer is noted within the posterolateral aspect of the lower left chest wall. This represents a new finding when compared to the prior study. Numerous chronic bilateral rib fractures are seen. A new lateral second left rib fracture is noted (axial CT image 26, CT series 6). A chronic fracture is seen involving the inferior aspect of the body of the sternum. A new fracture of the anterior aspect of the mid to upper body of the sternum is noted. There is a chronic compression fracture deformity at the level of T9. A new compression fracture deformity is seen involving predominantly the superior endplate of the T5 vertebral body. CT ABDOMEN PELVIS FINDINGS Hepatobiliary: The liver is cirrhotic, with diffuse heterogeneous appearance of the liver parenchyma. Status post cholecystectomy. No biliary dilatation. Pancreas: Unremarkable. No pancreatic ductal  dilatation or surrounding inflammatory changes. Spleen: Normal in size without focal abnormality. Adrenals/Urinary Tract: Adrenal glands are unremarkable. Kidneys are normal, without renal calculi, focal lesion, or hydronephrosis. The urinary bladder is markedly distended and is otherwise unremarkable. Stomach/Bowel: Surgical sutures are seen throughout the gastric region. The appendix is not clearly identified. No evidence of bowel wall thickening, distention, or inflammatory changes. Vascular/Lymphatic: Aortic atherosclerosis. No enlarged abdominal or pelvic lymph nodes. Reproductive: Uterus and bilateral adnexa are  unremarkable. Other: No abdominal wall hernia or abnormality. There is moderate severity abdominopelvic ascites. This is increased in severity when compared to the prior study. Musculoskeletal: No acute or significant osseous findings. IMPRESSION: 1. Moderate severity bilateral multifocal infiltrates. 2. Interval single lead ventricular pacer placement since the prior exam. 3. Acute lateral second left rib fracture with an acute fracture of the anterior aspect of the mid to upper body of the sternum. 4. Chronic compression fracture deformity at the level of T9. 5. New compression fracture deformity involving the superior endplate of the T5 vertebral body. 6. Hepatic cirrhosis. 7. Moderate severity abdominopelvic ascites, increased in severity when compared to the prior study. 8. 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 9. Aortic atherosclerosis. Aortic Atherosclerosis (ICD10-I70.0). Electronically Signed   By: Virgina Norfolk M.D.   On: 03/04/2022 01:50   CT HEAD WO CONTRAST (5MM)  Result Date: 02/11/2022 CLINICAL DATA:  Recent fall with headaches and chest pain, initial encounter EXAM: CT HEAD WITHOUT CONTRAST CT CERVICAL SPINE WITHOUT CONTRAST TECHNIQUE: Multidetector CT imaging of the head and cervical spine was performed following the standard protocol without intravenous contrast. Multiplanar CT image reconstructions of the cervical spine were also generated. RADIATION DOSE REDUCTION: This exam was performed according to the departmental dose-optimization program which includes automated exposure control, adjustment of the mA and/or kV according to patient size and/or use of iterative reconstruction technique. COMPARISON:  12/05/2021, 02/12/2022 FINDINGS: CT HEAD FINDINGS Brain: No evidence of acute infarction, hemorrhage, hydrocephalus, extra-axial collection or mass lesion/mass effect. Atrophic changes are noted. Vascular: No hyperdense vessel or unexpected calcification. Skull:  Normal. Negative for fracture or focal lesion. Sinuses/Orbits: No acute finding. Other: None. CT CERVICAL SPINE FINDINGS Alignment: Loss of the normal cervical lordosis is noted. Skull base and vertebrae: 7 cervical segments are well visualized. Increased kyphosis is noted centered at the C6 level. Mild compression deformity at C7 is noted new from a prior CT from 02/12/2022. There is a mildly displaced fracture involving the left superior articular facet at C7. Additionally there is a fracture involving the left lamina at C6. Avulsion from the inferior most aspect of the left inferior articular facet is noted at C5. This is best seen on image number 39 of series 11. No other definitive fractures are seen. The odontoid is within normal limits. Mild osteophytic changes and facet hypertrophic changes are noted. Soft tissues and spinal canal: Surrounding soft tissue structures are within normal limits. No focal hematoma is seen. Upper chest: Visualized lung apices demonstrate patchy alveolar opacities new from the prior CT examination. Early infiltrate cannot be totally excluded. Other: None IMPRESSION: CT of the head: Chronic atrophic changes without acute intracranial abnormality. CT of the cervical spine: Mild compression fracture at C7 new from the prior exam of 02/12/2022. Additionally there are fractures involving the superior articular facet on the left at C7 as well as the lamina on the left at C6. Small avulsion involving the inferior articular facet at C5 is noted as well. Patchy airspace opacities are noted  in the apices bilaterally. Early infiltrate cannot be totally excluded. Critical Value/emergent results were called by telephone at the time of interpretation on 02/14/2022 at 11:53 pm to [facility] , who verbally acknowledged these results. Electronically Signed   By: Inez Catalina M.D.   On: 02/23/2022 23:54   CT Cervical Spine Wo Contrast  Result Date: 03/04/2022 CLINICAL DATA:  Recent fall  with headaches and chest pain, initial encounter EXAM: CT HEAD WITHOUT CONTRAST CT CERVICAL SPINE WITHOUT CONTRAST TECHNIQUE: Multidetector CT imaging of the head and cervical spine was performed following the standard protocol without intravenous contrast. Multiplanar CT image reconstructions of the cervical spine were also generated. RADIATION DOSE REDUCTION: This exam was performed according to the departmental dose-optimization program which includes automated exposure control, adjustment of the mA and/or kV according to patient size and/or use of iterative reconstruction technique. COMPARISON:  12/05/2021, 02/12/2022 FINDINGS: CT HEAD FINDINGS Brain: No evidence of acute infarction, hemorrhage, hydrocephalus, extra-axial collection or mass lesion/mass effect. Atrophic changes are noted. Vascular: No hyperdense vessel or unexpected calcification. Skull: Normal. Negative for fracture or focal lesion. Sinuses/Orbits: No acute finding. Other: None. CT CERVICAL SPINE FINDINGS Alignment: Loss of the normal cervical lordosis is noted. Skull base and vertebrae: 7 cervical segments are well visualized. Increased kyphosis is noted centered at the C6 level. Mild compression deformity at C7 is noted new from a prior CT from 02/12/2022. There is a mildly displaced fracture involving the left superior articular facet at C7. Additionally there is a fracture involving the left lamina at C6. Avulsion from the inferior most aspect of the left inferior articular facet is noted at C5. This is best seen on image number 39 of series 11. No other definitive fractures are seen. The odontoid is within normal limits. Mild osteophytic changes and facet hypertrophic changes are noted. Soft tissues and spinal canal: Surrounding soft tissue structures are within normal limits. No focal hematoma is seen. Upper chest: Visualized lung apices demonstrate patchy alveolar opacities new from the prior CT examination. Early infiltrate cannot be  totally excluded. Other: None IMPRESSION: CT of the head: Chronic atrophic changes without acute intracranial abnormality. CT of the cervical spine: Mild compression fracture at C7 new from the prior exam of 02/12/2022. Additionally there are fractures involving the superior articular facet on the left at C7 as well as the lamina on the left at C6. Small avulsion involving the inferior articular facet at C5 is noted as well. Patchy airspace opacities are noted in the apices bilaterally. Early infiltrate cannot be totally excluded. Critical Value/emergent results were called by telephone at the time of interpretation on 02/08/2022 at 11:53 pm to [facility] , who verbally acknowledged these results. Electronically Signed   By: Inez Catalina M.D.   On: 02/08/2022 23:54       LOS: 1 day   [facility] Pager on [email or web address]  03/05/2022, 8:50 AM

## 2022-03-05 NOTE — Evaluation (Addendum)
Occupational Therapy Evaluation Patient Details Name: Mallory Ayers MRN: [id number] DOB: 02-27-1971 Today's Date: 03/05/2022   History of Present Illness Pt is a 51 y/o female admitted after a fall while transferring to power chair. Head/neck CT showed mildly displaced fx of L articular facet of CT and L lamina of C6. Neurosx recommends nonoperative mgmt. 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   Clinical Impression   PTA, pt lives with fiance and son, typically Modified Independent with ADLs, household IADLs and transfers to/from power chair though limited by progressive weakness. Pt presents now with deficits in strength and endurance with new cervical/ICD precautions impacting daily tasks. Pt able to demo bed mobility and scooting along stretcher with min guard to Min A though does endorse fatigue post activity. Pt's mother present and reports concerns regarding pt strength and ability to manage without assist at home. Both interested in postacute rehab at SNF. Will continue to follow and further assess power chair transfers and ADLs w/ cervical precautions in next sessions.       Recommendations for follow up therapy are one component of a multi-disciplinary discharge planning process, led by the attending physician.  Recommendations may be updated based on patient status, additional functional criteria and insurance authorization.   Follow Up Recommendations  Skilled nursing-short term rehab (<3 hours/day)     Assistance Recommended at Discharge Frequent or constant Supervision/Assistance  Patient can return home with the following A lot of help with walking and/or transfers;A lot of help with bathing/dressing/bathroom    Functional Status Assessment  Patient has had a recent decline in their functional status and demonstrates the ability to make significant improvements in function in a reasonable and predictable amount of time.  Equipment  Recommendations  None recommended by OT    Recommendations for Other Services       Precautions / Restrictions Precautions Precautions: Fall;Other (comment);Cervical;ICD/Pacemaker Precaution Comments: B AKA w/o prosthetics Required Braces or Orthoses: Cervical Brace Cervical Brace: Hard collar Restrictions Weight Bearing Restrictions: No      Mobility Bed Mobility Overal bed mobility: Needs Assistance Bed Mobility: Supine to Sit, Sit to Supine     Supine to sit: Min assist, HOB elevated Sit to supine: Min guard   General bed mobility comments: Light Min A to fully scoot to edge of stretcher, able to scoot back onto stretcher with increased time and without assist    Transfers                   General transfer comment: deferred due to stretcher height      Balance Overall balance assessment: Needs assistance Sitting-balance support: No upper extremity supported Sitting balance-Leahy Scale: Fair                                     ADL either performed or assessed with clinical judgement   ADL Overall ADL's : Needs assistance/impaired Eating/Feeding: Set up;Sitting Eating/Feeding Details (indicate cue type and reason): able to open pepper packet, difficulty cutting chicken (though using plasticware in ED) Grooming: Set up;Sitting   Upper Body Bathing: Minimal assistance;Sitting   Lower Body Bathing: Moderate assistance;Sitting/lateral leans;Bed level   Upper Body Dressing : Set up;Sitting   Lower Body Dressing: Moderate assistance;Sitting/lateral leans;Bed level       Toileting- Clothing Manipulation and Hygiene: Moderate assistance;Sitting/lateral lean;Bed level  General ADL Comments: Limited by cervical precautions, progressive weakness with recent onset at home. Relies heavily on BUE for transfers impacted by cervical precautions     Vision Ability to See in Adequate Light: 0 Adequate Patient Visual Report: No change  from baseline Vision Assessment?: No apparent visual deficits     Perception     Praxis      Pertinent Vitals/Pain Pain Assessment Pain Assessment: No/denies pain     Hand Dominance Right   Extremity/Trunk Assessment Upper Extremity Assessment Upper Extremity Assessment: Generalized weakness (denies any sensation deficits)   Lower Extremity Assessment Lower Extremity Assessment: Defer to PT evaluation   Cervical / Trunk Assessment Cervical / Trunk Assessment: Other exceptions Cervical / Trunk Exceptions: C6/7 fx, in hard collar   Communication Communication Communication: No difficulties   Cognition Arousal/Alertness: Awake/alert Behavior During Therapy: WFL for tasks assessed/performed, Flat affect Overall Cognitive Status: Impaired/Different from baseline Area of Impairment: Awareness, Problem solving                           Awareness: Emergent Problem Solving: Slow processing General Comments: flat affect, somewhat drowsy but able to follow directions consistently, slower to respond at times but overall functional     General Comments  Pt mother present, tearful at times reports inability to physically assist pt and worried about her safety/independence in managing at home    Exercises     Shoulder Instructions      Home Living Family/patient expects to be discharged to:: Private residence Living Arrangements: Spouse/significant other;Children Available Help at Discharge: Family;Available PRN/intermittently Type of Home: House Home Access: Ramped entrance     Home Layout: One level;Other (Comment)     Bathroom Shower/Tub: Occupational psychologist: Handicapped height Bathroom Accessibility: Yes   Home Equipment: Wheelchair - manual;Shower seat - built in;Wheelchair - power   Additional Comments: w/c can get into the bathroom well.      Prior Functioning/Environment Prior Level of Function : Needs assist              Mobility Comments: typically MOD I to transfer to/from power chair but increasing difficulty due to weakness. will stand on residual limbs for bed transfers, scoot to toilet ADLs Comments: typically MOD I for ADLs, can do basic household IADLs from power chair level        OT Problem List: Decreased strength;Decreased activity tolerance;Decreased knowledge of precautions      OT Treatment/Interventions: Self-care/ADL training;Therapeutic exercise;Energy conservation;DME and/or AE instruction;Therapeutic activities    OT Goals(Current goals can be found in the care plan section) Acute Rehab OT Goals Patient Stated Goal: hopeful for rehab to increase strength to maximize independence at home OT Goal Formulation: With patient/family Time For Goal Achievement: 03/19/22 Potential to Achieve Goals: Good ADL Goals Pt Will Perform Lower Body Bathing: with modified independence;sitting/lateral leans;bed level Pt Will Perform Lower Body Dressing: with modified independence;sitting/lateral leans;bed level Pt Will Transfer to Toilet: with modified independence;anterior/posterior transfer;bedside commode  OT Frequency: Min 2X/week    Co-evaluation PT/OT/SLP Co-Evaluation/Treatment: Yes Reason for Co-Treatment: To address functional/ADL transfers;For patient/therapist safety   OT goals addressed during session: ADL's and self-care;Strengthening/ROM      AM-PAC OT "6 Clicks" Daily Activity     Outcome Measure Help from another person eating meals?: A Little Help from another person taking care of personal grooming?: A Little Help from another person toileting, which includes using toliet, bedpan, or urinal?: A Lot  Help from another person bathing (including washing, rinsing, drying)?: A Lot Help from another person to put on and taking off regular upper body clothing?: A Little Help from another person to put on and taking off regular lower body clothing?: A Lot 6 Click Score: 15   End of  Session Equipment Utilized During Treatment: Cervical collar  Activity Tolerance: Patient tolerated treatment well Patient left: in bed;with call bell/phone within reach;with family/visitor present  OT Visit Diagnosis: Other abnormalities of gait and mobility (R26.89);Muscle weakness (generalized) (M62.81)                Time: 8288-3374 OT Time Calculation (min): 27 min Charges:  OT General Charges $OT Visit: 1 Visit OT Evaluation $OT Eval Moderate Complexity: 1 Mod  Malachy Chamber, OTR/L Acute Rehab Services Office: [phone]   Layla Maw 03/05/2022, 1:51 PM

## 2022-03-05 NOTE — Evaluation (Signed)
Physical Therapy Evaluation Patient Details Name: Mallory Ayers MRN: [id number] DOB: April 01, 1971 Today's Date: 03/05/2022  History of Present Illness  Pt is a 51 y/o female admitted after a fall while transferring to power chair. Head/neck CT showed mildly displaced fx of L articular facet of CT and L lamina of C6. Neurosx recommends nonoperative mgmt. 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  Clinical Impression   Pt presents with generalized weakness, impaired balance, impaired activity tolerance, and decreased knowledge of cervical precautions/c-collar fit. Pt to benefit from acute PT to address deficits. Pt requiring light assist for bed-level mobility this date, could not transfer OOB given weakness and typically pt is indep for transfer-level mobility. PT to progress mobility as tolerated, and will continue to follow acutely.         Recommendations for follow up therapy are one component of a multi-disciplinary discharge planning process, led by the attending physician.  Recommendations may be updated based on patient status, additional functional criteria and insurance authorization.  Follow Up Recommendations Skilled nursing-short term rehab (<3 hours/day) Can patient physically be transported by private vehicle: No    Assistance Recommended at Discharge Frequent or constant Supervision/Assistance  Patient can return home with the following  A lot of help with walking and/or transfers;A lot of help with bathing/dressing/bathroom    Equipment Recommendations None recommended by PT  Recommendations for Other Services       Functional Status Assessment Patient has had a recent decline in their functional status and demonstrates the ability to make significant improvements in function in a reasonable and predictable amount of time.     Precautions / Restrictions Precautions Precautions: Fall;Other  (comment);Cervical;ICD/Pacemaker Precaution Comments: B AKA w/o prosthetics Required Braces or Orthoses: Cervical Brace Cervical Brace: Hard collar Restrictions Weight Bearing Restrictions: No      Mobility  Bed Mobility Overal bed mobility: Needs Assistance Bed Mobility: Supine to Sit, Sit to Supine     Supine to sit: Min assist, HOB elevated Sit to supine: Min assist   General bed mobility comments: Light Min A to fully scoot to edge of stretcher, able to scoot back onto stretcher with increased time.    Transfers Overall transfer level: Needs assistance   Transfers: Bed to chair/wheelchair/BSC            Lateral/Scoot Transfers: Min assist General transfer comment: scoot R towards HOB with light assist to boost buttocks at times, very increased time    Ambulation/Gait                  Stairs            Wheelchair Mobility    Modified Rankin (Stroke Patients Only)       Balance Overall balance assessment: Needs assistance Sitting-balance support: No upper extremity supported Sitting balance-Leahy Scale: Fair                                       Pertinent Vitals/Pain Pain Assessment Pain Assessment: No/denies pain    Home Living Family/patient expects to be discharged to:: Private residence Living Arrangements: Spouse/significant other;Children Available Help at Discharge: Family;Available PRN/intermittently Type of Home: House Home Access: Ramped entrance       Home Layout: One level;Other (Comment) Home Equipment: Wheelchair - manual;Shower seat - built in;Wheelchair - power Additional Comments: w/c can get into the bathroom  well.    Prior Function Prior Level of Function : Needs assist             Mobility Comments: typically MOD I to transfer to/from power chair but increasing difficulty due to weakness. will stand on residual limbs for bed transfers, scoot to toilet ADLs Comments: typically MOD I for  ADLs, can do basic household IADLs from power chair level     Hand Dominance   Dominant Hand: Right    Extremity/Trunk Assessment   Upper Extremity Assessment Upper Extremity Assessment: Defer to OT evaluation    Lower Extremity Assessment Lower Extremity Assessment: Generalized weakness (Bilat AKA)    Cervical / Trunk Assessment Cervical / Trunk Assessment: Other exceptions Cervical / Trunk Exceptions: C6/7 fx, in hard collar  Communication   Communication: No difficulties  Cognition Arousal/Alertness: Awake/alert Behavior During Therapy: WFL for tasks assessed/performed, Flat affect Overall Cognitive Status: Impaired/Different from baseline Area of Impairment: Awareness, Problem solving                           Awareness: Emergent Problem Solving: Slow processing General Comments: flat affect, somewhat drowsy but able to follow directions consistently, slower to respond at times but overall functional        General Comments General comments (skin integrity, edema, etc.): pt's mother at bedside    Exercises     Assessment/Plan    PT Assessment Patient needs continued PT services  PT Problem List Decreased strength;Decreased mobility;Decreased activity tolerance;Decreased balance;Decreased knowledge of use of DME;Pain;Decreased safety awareness;Decreased knowledge of precautions;Cardiopulmonary status limiting activity       PT Treatment Interventions DME instruction;Therapeutic activities;Therapeutic exercise;Patient/family education;Balance training;Functional mobility training;Neuromuscular re-education    PT Goals (Current goals can be found in the Care Plan section)  Acute Rehab PT Goals PT Goal Formulation: With patient Time For Goal Achievement: 03/19/22 Potential to Achieve Goals: Good    Frequency Min 3X/week     Co-evaluation   Reason for Co-Treatment: For patient/therapist safety;To address functional/ADL transfers PT goals  addressed during session: Mobility/safety with mobility;Balance OT goals addressed during session: ADL's and self-care;Strengthening/ROM       AM-PAC PT "6 Clicks" Mobility  Outcome Measure Help needed turning from your back to your side while in a flat bed without using bedrails?: A Little Help needed moving from lying on your back to sitting on the side of a flat bed without using bedrails?: A Little Help needed moving to and from a bed to a chair (including a wheelchair)?: A Lot Help needed standing up from a chair using your arms (e.g., wheelchair or bedside chair)?: Total Help needed to walk in hospital room?: Total Help needed climbing 3-5 steps with a railing? : Total 6 Click Score: 11    End of Session Equipment Utilized During Treatment: Cervical collar Activity Tolerance: Patient limited by fatigue Patient left: in bed;with call bell/phone within reach;with family/visitor present Nurse Communication: Mobility status PT Visit Diagnosis: Other abnormalities of gait and mobility (R26.89);Muscle weakness (generalized) (M62.81)    Time: 3235-5732 PT Time Calculation (min) (ACUTE ONLY): 28 min   Charges:   PT Evaluation $PT Eval Low Complexity: 1 Low         Sinan Tuch S, PT DPT Acute Rehabilitation Services Pager [phone]  Office [phone]  Roxine Caddy E Ruffin Pyo 03/05/2022, 2:27 PM

## 2022-03-05 NOTE — ED Notes (Signed)
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 hydrocephalus, extra-axial collection or mass lesion/mass effect. Atrophic changes are noted. Vascular: No hyperdense vessel or unexpected calcification. Skull: Normal. Negative for fracture or focal lesion. Sinuses/Orbits: No acute finding. Other: None. CT CERVICAL SPINE FINDINGS Alignment: Loss of the normal cervical lordosis is noted. Skull base and vertebrae: 7 cervical segments are well visualized. Increased kyphosis is noted centered at the C6 level. Mild compression deformity at C7 is noted new from a prior CT from 02/12/2022. There is a mildly displaced fracture involving the left superior  articular facet at C7. Additionally there is a fracture involving the left lamina at C6. Avulsion from the inferior most aspect of the left inferior articular facet is noted at C5. This is best seen on image number 39 of series 11. No other definitive fractures are seen. The odontoid is within normal limits. Mild osteophytic changes and facet hypertrophic changes are noted. Soft tissues and spinal canal: Surrounding soft tissue structures are within normal limits. No focal hematoma is seen. Upper chest: Visualized lung apices demonstrate patchy alveolar opacities new from the prior CT examination. Early infiltrate cannot be totally excluded. Other: None IMPRESSION: CT of the head: Chronic atrophic changes without acute intracranial abnormality. CT of the cervical spine: Mild compression fracture at C7 new from the prior exam of 02/12/2022. Additionally there are fractures involving the superior articular facet on the left at C7 as well as the lamina on the left at C6. Small avulsion involving the inferior articular facet at C5 is noted as well. Patchy airspace opacities are noted in the apices bilaterally. Early infiltrate cannot be totally excluded. Critical Value/emergent results were called by telephone at the time of interpretation on 02/16/2022 at 11:53 pm to [facility] , who verbally acknowledged these results. Electronically Signed   By: Inez Catalina M.D.   On: 02/27/2022 23:54    Pending Labs  Unresulted Labs (From admission, onward)     Start     Ordered   03/06/22 0500  Ammonia  Tomorrow morning,   R        03/05/22 0832   03/06/22 0500  CBC  Tomorrow morning,   R        03/05/22 0832   03/06/22 0500  Comprehensive metabolic panel  Tomorrow morning,   R        03/05/22 0832   03/06/22 0500  Magnesium  Tomorrow morning,   R        03/05/22 0832   03/05/22 0500  Blood gas, venous  Tomorrow morning,   R        03/05/22 0319   03/05/22 0320  Vitamin B1  Add-on,   AD        03/05/22 0319    03/04/22 0354  Blood gas, venous  Once,   R        03/04/22 0354   03/04/22 0354  Legionella Pneumophila Serogp 1 Ur Ag  Once,   R        03/04/22 0354   03/04/22 0354  Expectorated Sputum Assessment w Gram Stain, Rflx to Resp Cult  (Severe pneumonia (requires ICU care) in adults without resistant organism risk factors )  ONCE - URGENT,   URGENT        03/04/22 0354            Vitals/Pain Today's Vitals   03/05/22 0754 03/05/22 1030 03/05/22 1103 03/05/22 1132  BP:  103/63    Pulse:  92    Resp:  (!) 21    Temp:   99.4 F (37.4 C)   TempSrc:   Oral   SpO2:  95%    Weight:      Height:      PainSc: 7    3     Isolation Precautions Airborne and Contact precautions  Medications Medications  sodium chloride flush (NS) 0.9 % injection 10-40 mL ( Intracatheter Not Given 03/05/22 1117)  Chlorhexidine Gluconate Cloth 2 % PADS 6 each (6 each Topical Not Given 03/05/22 1121)  HYDROmorphone (DILAUDID) tablet 4 mg (4 mg Oral Given 03/04/22 1432)  oxyCODONE (Oxy IR/ROXICODONE) immediate release tablet 15 mg (15 mg Oral Given 03/05/22 0749)  aspirin chewable tablet 81 mg (81 mg Oral Given 03/05/22 1100)  busPIRone (BUSPAR) tablet 10 mg (10 mg Oral Given 03/05/22 1100)  FLUoxetine (PROZAC) capsule 60 mg (60 mg Oral Given 03/05/22 1059)  levothyroxine (SYNTHROID) tablet 100 mcg (100 mcg Oral Given 03/05/22 0630)  magnesium oxide (MAG-OX) tablet 400 mg (400 mg Oral Given 03/05/22 1100)  saccharomyces boulardii (FLORASTOR) capsule 250 mg (250 mg Oral Given 03/05/22 1155)  pregabalin (LYRICA) capsule 225 mg (225 mg Oral Given 03/05/22 1059)  multivitamin with minerals tablet 1 tablet (1 tablet Oral Given 03/05/22 1100)  potassium chloride SA (KLOR-CON M) CR tablet 20 mEq (20 mEq Oral Given 03/05/22 1100)  mometasone-formoterol (DULERA) 200-5 MCG/ACT inhaler 2 puff (2 puffs Inhalation Given 03/05/22 1117)  lidocaine (LIDODERM) 5 % 1 patch (1 patch Transdermal Patch Applied 03/05/22  1058)  methocarbamol (ROBAXIN) tablet 500 mg (500 mg Oral Given 03/04/22 1013)  apixaban (ELIQUIS) tablet 5 mg (5 mg Oral Given 03/05/22 1100)  lactulose (CHRONULAC) 10 GM/15ML solution 20 g (20 g Oral Given 03/05/22 1103)  amoxicillin-clavulanate (AUGMENTIN) 875-125 MG per tablet 1 tablet (1 tablet Oral Given 03/05/22 1100)  iohexol (OMNIPAQUE) 350 MG/ML injection 75 mL (75 mLs  Intravenous Contrast Given 03/04/22 0128)  ceFEPIme (MAXIPIME) 2 g in sodium chloride 0.9 % 100 mL IVPB (0 g Intravenous Stopped 03/04/22 0442)  vancomycin (VANCOREADY) IVPB 1250 mg/250 mL (0 mg Intravenous Stopped 03/04/22 0700)    Mobility manual wheelchair High fall risk   Focused Assessments Neuro Assessment Handoff:  Swallow screen pass? Yes  Cardiac Rhythm: Normal sinus rhythm       Neuro Assessment: Within Defined Limits Neuro Checks:      Last Documented NIHSS Modified Score:   Has TPA been given? No If patient is a Neuro Trauma and patient is going to OR before floor call report to [place] nurse: [phone] or [phone]   R Recommendations: See Admitting Provider Note  Report given to:   Additional Notes:

## 2022-03-06 ENCOUNTER — Inpatient Hospital Stay (HOSPITAL_COMMUNITY): Payer: Medicare HMO

## 2022-03-06 DIAGNOSIS — J9601 Acute respiratory failure with hypoxia: Secondary | ICD-10-CM

## 2022-03-06 DIAGNOSIS — W19XXXA Unspecified fall, initial encounter: Secondary | ICD-10-CM

## 2022-03-06 DIAGNOSIS — Z8674 Personal history of sudden cardiac arrest: Secondary | ICD-10-CM

## 2022-03-06 DIAGNOSIS — K7682 Hepatic encephalopathy: Secondary | ICD-10-CM | POA: Diagnosis not present

## 2022-03-06 DIAGNOSIS — Z7189 Other specified counseling: Secondary | ICD-10-CM

## 2022-03-06 DIAGNOSIS — K7031 Alcoholic cirrhosis of liver with ascites: Secondary | ICD-10-CM | POA: Diagnosis not present

## 2022-03-06 DIAGNOSIS — K920 Hematemesis: Secondary | ICD-10-CM | POA: Diagnosis not present

## 2022-03-06 DIAGNOSIS — K922 Gastrointestinal hemorrhage, unspecified: Secondary | ICD-10-CM | POA: Diagnosis not present

## 2022-03-06 DIAGNOSIS — Z7901 Long term (current) use of anticoagulants: Secondary | ICD-10-CM | POA: Diagnosis not present

## 2022-03-06 DIAGNOSIS — S129XXA Fracture of neck, unspecified, initial encounter: Secondary | ICD-10-CM | POA: Diagnosis not present

## 2022-03-06 DIAGNOSIS — Z515 Encounter for palliative care: Secondary | ICD-10-CM

## 2022-03-06 DIAGNOSIS — J189 Pneumonia, unspecified organism: Secondary | ICD-10-CM | POA: Diagnosis not present

## 2022-03-06 DIAGNOSIS — J690 Pneumonitis due to inhalation of food and vomit: Secondary | ICD-10-CM | POA: Diagnosis not present

## 2022-03-06 LAB — COMPREHENSIVE METABOLIC PANEL
ALT: 11 U/L (ref 0–44)
AST: 65 U/L — ABNORMAL HIGH (ref 15–41)
Albumin: 1.8 g/dL — ABNORMAL LOW (ref 3.5–5.0)
Alkaline Phosphatase: 241 U/L — ABNORMAL HIGH (ref 38–126)
Anion gap: 11 (ref 5–15)
BUN: 22 mg/dL — ABNORMAL HIGH (ref 6–20)
CO2: 18 mmol/L — ABNORMAL LOW (ref 22–32)
Calcium: 7.6 mg/dL — ABNORMAL LOW (ref 8.9–10.3)
Chloride: 97 mmol/L — ABNORMAL LOW (ref 98–111)
Creatinine, Ser: 1.03 mg/dL — ABNORMAL HIGH (ref 0.44–1.00)
GFR, Estimated: >60 mL/min (ref >60)
Glucose, Bld: 112 mg/dL — ABNORMAL HIGH (ref 70–99)
Potassium: 4.1 mmol/L (ref 3.5–5.1)
Sodium: 126 mmol/L — ABNORMAL LOW (ref 135–145)
Total Bilirubin: 4 mg/dL — ABNORMAL HIGH (ref 0.3–1.2)
Total Protein: 5.4 g/dL — ABNORMAL LOW (ref 6.5–8.1)

## 2022-03-06 LAB — CBC
HCT: 25.1 % — ABNORMAL LOW (ref 36.0–46.0)
Hemoglobin: 8.9 g/dL — ABNORMAL LOW (ref 12.0–15.0)
MCH: 32.1 pg (ref 26.0–34.0)
MCHC: 35.5 g/dL (ref 30.0–36.0)
MCV: 90.6 fL (ref 80.0–100.0)
Platelets: 149 10*3/uL — ABNORMAL LOW (ref 150–400)
RBC: 2.77 MIL/uL — ABNORMAL LOW (ref 3.87–5.11)
RDW: 21.1 % — ABNORMAL HIGH (ref 11.5–15.5)
WBC: 13.7 10*3/uL — ABNORMAL HIGH (ref 4.0–10.5)
nRBC: 0 % (ref 0.0–0.2)

## 2022-03-06 LAB — MAGNESIUM: Magnesium: 1.9 mg/dL (ref 1.7–2.4)

## 2022-03-06 LAB — AMMONIA: Ammonia: 62 umol/L — ABNORMAL HIGH (ref 9–35)

## 2022-03-06 LAB — HEMOGLOBIN AND HEMATOCRIT, BLOOD
HCT: 23.5 % — ABNORMAL LOW (ref 36.0–46.0)
HCT: 22.3 % — ABNORMAL LOW (ref 36.0–46.0)
Hemoglobin: 8 g/dL — ABNORMAL LOW (ref 12.0–15.0)
Hemoglobin: 7.8 g/dL — ABNORMAL LOW (ref 12.0–15.0)

## 2022-03-06 MED ORDER — OCTREOTIDE LOAD VIA INFUSION
50.0000 ug | Freq: Once | INTRAVENOUS | Status: AC
  Administered 2022-03-06: 50 ug via INTRAVENOUS
  Filled 2022-03-06: qty 25

## 2022-03-06 MED ORDER — PANTOPRAZOLE 80MG IVPB - SIMPLE MED
80.0000 mg | Freq: Once | INTRAVENOUS | Status: AC
  Administered 2022-03-06: 80 mg via INTRAVENOUS
  Filled 2022-03-06: qty 100

## 2022-03-06 MED ORDER — SODIUM CHLORIDE 0.9 % IV SOLN: INTRAVENOUS | Status: DC

## 2022-03-06 MED ORDER — PANTOPRAZOLE SODIUM 40 MG IV SOLR: 40.0000 mg | Freq: Two times a day (BID) | INTRAVENOUS | Status: DC

## 2022-03-06 MED ORDER — PANTOPRAZOLE INFUSION (NEW) - SIMPLE MED
8.0000 mg/h | INTRAVENOUS | Status: DC
  Administered 2022-03-06 – 2022-03-08 (×6): 8 mg/h via INTRAVENOUS
  Filled 2022-03-06 (×6): qty 100

## 2022-03-06 MED ORDER — SODIUM CHLORIDE 0.9 % IV SOLN
50.0000 ug/h | INTRAVENOUS | Status: DC
  Administered 2022-03-06 – 2022-03-08 (×5): 50 ug/h via INTRAVENOUS
  Filled 2022-03-06 (×6): qty 1

## 2022-03-06 MED ORDER — FENTANYL CITRATE PF 50 MCG/ML IJ SOSY
50.0000 ug | PREFILLED_SYRINGE | Freq: Once | INTRAMUSCULAR | Status: AC
  Administered 2022-03-06: 50 ug via INTRAVENOUS
  Filled 2022-03-06: qty 1

## 2022-03-06 NOTE — Progress Notes (Signed)
   03/06/22 2140  Charting Type  Charting Type Full Reassessment Changes Noted  Focused Reassessment No Changes Respiratory;Psychosocial  Respiratory  Respiratory Pattern Labored;Accessory muscle use;Symmetrical  Chest Assessment Chest expansion symmetrical  ECG Monitoring  ECG Heart Rate (!) 102  Psychosocial  Psychosocial (WDL) X  Patient Behaviors Anxious;Agitated;Restless;Paranoid     Patient's family called out in the hall for help, patient had a coughing episode where she became anxious and attempted removing collar.   RN immediately at bedside to assist with suction and repositioning. [place] '@5L'$ , 98%.  [facility] informed. See new orders.

## 2022-03-06 NOTE — Progress Notes (Signed)
   03/06/22 0100  Charting Type  Charting Type Full Reassessment Changes Noted  Focused Reassessment No Changes Respiratory  Respiratory  Respiratory (WDL) X  Respiratory Pattern Regular;Unlabored;Symmetrical  Chest Assessment Chest expansion symmetrical  Bilateral Breath Sounds Fine crackles;Expiratory wheezes  ECG Monitoring  ECG Heart Rate (!) 105  Gastrointestinal  Gastrointestinal (WDL) X  Emesis Characteristics  Emesis Appearance Clear;Yellow  Neurological  Level of Consciousness Alert     At approximately 1 am, patient called out asking for assistance. On assessment patient had an episode of vomiting.  Airway was cleared with suction, comfort was afforded. Patient was instructed to remain NPO until further notice.   See assessment data attached.

## 2022-03-06 NOTE — Progress Notes (Signed)
       Overnight   (Remote coverage)  NAME: Mallory Ayers MRN: [id number] DOB : 1971/02/22    Date of Service   03/06/2022   HPI/Events of Note   History of HTN, OSA, Trauma w Bilateral AKA, Vt and cardiac arrest (September 2023 PPM/ICD 03/01/22  Fall with injury while transferring w wheelchair.  Notified by RN for vomiting blood (+/- 175cc red blood) and some degree of loss of responsiveness. There was consideration of use of BVM by RNs as SPO2 reportedly dropped to the 60s with good waveform.  Patient did regain responsiveness and there was concern for aspiration. During event Remote coverage NP notified On site floor coverage Physician for emergent bedside visit (see notation in chart)  Rapid response RN was notified     Interventions/ Plan   STAT portable chest xray - concern for aspiration Stat CBC w serial H?H ordered - blood loss Protonix bolus/drip ordered Sandostatin drip ordered Oncoming assigned Attending made aware of emerging events            Gershon Cull BSN MSNA MSN ACNPC-AG Acute Care Nurse Practitioner [place]

## 2022-03-06 NOTE — Progress Notes (Signed)
[facility] PROGRESS NOTE   Mallory Ayers HDQ:[id number] DOB: 06-01-1970 DOA: 02/20/2022  PCP: [name], MD  Brief History/Interval Summary: 51 y.o. 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  Patient then on 11/24  has PPM/ICD implanted without complication and was discharged home on 03/02/22. Patient at home had fall while completing wheel chair transfer s/p which she sustained trauma to her head and neck. Patient notes continued pain in neck as well as her chronic pain.  Imaging studies raised concern for cervical spine injury and fracture.  She was hospitalized for further management.  Hospital stay complicated by vomiting of blood.    Consultants: Neurosurgery, GI      Subjective/Interval History: Vomited blood overnight Per daughter at bedside has never done this before   Assessment/Plan:  Cervical spine injury secondary to mechanical fall Patient found to have mild compression fracture of C7.  Fracture of the articular facet on the left at C7 fracture of the lamina at C6.  Other injuries also noted on imaging studies. Patient currently has a soft cervical collar in place. Patient seen by neurosurgery.  No plans for surgical intervention.  Continue with cervical collar.  They would like to follow the patient in their office in 2 weeks time. Pain control.  Hematemesis -GI consult -octreotide and protonix gtt  Poor overall prognosis -GI consult  Fatty liver and likely cirrhosis. No changes of portal hypertension on recent CT scans. These do reveal ascites.  -paracentesis ordered to r/o SBP  Rib fracture/sternal fracture secondary to fall Seen by trauma service.  No interventions recommended.  Pain control.  Incentive  spirometry.  Healthcare associated pneumonia Patient found to have bilateral infiltrates on imaging studies.  She was recently hospitalized for ICD placement which was done on 11/24. She was intubated for this procedure. She could have aspirated. COVID-19 influenza PCR negative.  Respiratory viral panel negative. MRSA PCR negative. Augmentin -wean O2 as able  Hepatic encephalopathy Patient noted to have asterixis.  This could have contributed to lack of coordination while picking up things.  Ammonia level is noted to be elevated at 58.   -lactulose started  Mild acute kidney injury with metabolic acidosis She was given IV fluids in the emergency department.  Continue to monitor urine output.  Avoid nephrotoxic agents. -trend  Hyponatremia - Likely due to liver cirrhosis. monitor  Normocytic anemia -trend -transfuse for <7  Recent cardiac arrest/history of ventricular tachycardia She was on a LifeVest and then had ICD/pacemaker placed on 11/24 by cardiology. Echocardiogram from August showed normal left ventricular systolic function.  History of pulmonary embolism Eliquis was on hold for ICD placement.  Hold eliquis  History of breast cancer. She is s/p mastectomy.  Chemotherapy was in June 2023.  Has a left Port-A-Cath.  Follows with oncology in [employer]. -poor overall prognosis per daughter  Obesity Estimated body mass index is 49.88 kg/m as calculated from the following:   Height as of this encounter: '3\' 10"'$  (1.168 m).   Weight as of this encounter: 68.1 kg.   History of obstructive sleep apnea Not on CPAP.  Recent C. difficile infection Completed course of antibiotics about 2 weeks ago.  No diarrhea at this time.  Hypothyroidism Continue levothyroxine.  Chronic pain syndrome Takes  oxycodone and Dilaudid at home for pain relief.  Bowel regimen.  Has been started on lactulose.  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  DVT Prophylaxis: scd Code Status: Full code Family Communication: daughter Disposition Plan: pending  Status is: Inpatient Remains inpatient appropriate because: Cervical spine injury, rib fracture      Medications: Scheduled:  amoxicillin-clavulanate  1 tablet Oral Q12H   busPIRone  10 mg Oral BID   Chlorhexidine Gluconate Cloth  6 each Topical Daily   FLUoxetine  60 mg Oral Daily   influenza vac split quadrivalent PF  0.5 mL Intramuscular Tomorrow-1000   lactulose  20 g Oral BID   levothyroxine  100 mcg Oral Q0600   lidocaine  1 patch Transdermal Q24H   magnesium oxide  400 mg Oral Daily   mometasone-formoterol  2 puff Inhalation BID   multivitamin with minerals  1 tablet Oral q morning   [START ON 03/09/2022] pantoprazole  40 mg Intravenous Q12H   pneumococcal 23 valent vaccine  0.5 mL Intramuscular Tomorrow-1000   potassium chloride SA  20 mEq Oral BID   pregabalin  225 mg Oral BID   saccharomyces boulardii  250 mg Oral BID   sodium chloride flush  10-40 mL Intracatheter Q12H   Continuous:  sodium chloride 10 mL/hr (03/05/22 2002)   sodium chloride 150 mL/hr at 03/06/22 1153   octreotide (SANDOSTATIN) 500 mcg in sodium chloride 0.9 % 250 mL (2 mcg/mL) infusion 50 mcg/hr (03/06/22 0722)   pantoprazole 8 mg/hr (03/06/22 0849)   WER:[id number], methocarbamol, mouth rinse, oxyCODONE    Objective:  Vital Signs  Vitals:   03/06/22 0705 03/06/22 0714 03/06/22 0800 03/06/22 1138  BP: 116/80 119/76 114/70 117/78  Pulse: (!) 105 (!) 105 (!) 101 94  Resp: 17 (!) 28 20 (!) 21  Temp:   97.8 F (36.6 C) 98.1 F (36.7 C)  TempSrc:   Oral Oral  SpO2: 100% 100% 100% 100%  Weight:      Height:        Intake/Output Summary (Last 24 hours) at 03/06/2022 1229 Last data filed at 03/06/2022 9326 Gross per 24 hour  Intake 20 ml  Output 100 ml  Net -80 ml   Filed Weights   02/09/2022 2236 03/05/22 1600  Weight: 67.6 kg 68.1 kg     General:  Appearance:    Severely obese female who appears uncomfortable     Lungs:     respirations unlabored  Heart:    Normal heart rate.  MS:    B/l amputation  Neurologic:   Awake, alert,       Lab Results:  Data Reviewed: I have personally reviewed following labs and reports of the imaging studies  CBC: Recent Labs  Lab 02/08/2022 2250 03/04/22 0450 03/04/22 0503 03/05/22 0413 03/06/22 0557  WBC 12.1* 9.5  --   --  13.7*  NEUTROABS 9.8* 7.5  --   --   --   HGB 9.4* 8.5* 8.5* 9.2* 8.9*  HCT 27.5* 25.6* 25.0* 27.0* 25.1*  MCV 94.2 96.2  --   --  90.6  PLT 160 131*  --   --  149*    Basic Metabolic Panel: Recent Labs  Lab 03/01/2022 2250 03/04/22 0450 03/04/22 0503 03/05/22 0413 03/06/22 0557  NA 130* 129* 128* 128* 126*  K 3.6 3.5 3.5 4.5 4.1  CL 97* 100  --   --  97*  CO2  17* 18*  --   --  18*  GLUCOSE 127* 124*  --   --  112*  BUN 8 10  --   --  22*  CREATININE 1.18* 1.14*  --   --  1.03*  CALCIUM 8.1* 7.5*  --   --  7.6*  MG  --  1.7  --   --  1.9    GFR: Estimated Creatinine Clearance: 36.3 mL/min (A) (by C-G formula based on SCr of 1.03 mg/dL (H)).  Liver Function Tests: Recent Labs  Lab 02/14/2022 2250 03/04/22 0450 03/06/22 0557  AST 91* 82* 65*  ALT '12 11 11  '$ ALKPHOS 229* 232* 241*  BILITOT 2.1* 1.8* 4.0*  PROT 5.8* 5.4* 5.4*  ALBUMIN 2.1* 1.9* 1.8*     Recent Results (from the past 240 hour(s))  Blood culture (routine x 2)     Status: None (Preliminary result)   Collection Time: 03/04/22  2:30 AM   Specimen: BLOOD  Result Value Ref Range Status   Specimen Description BLOOD LEFT ARM  Final   Special Requests   Final    BOTTLES DRAWN AEROBIC AND ANAEROBIC Blood Culture results may not be optimal due to an inadequate volume of blood received in culture bottles   Culture   Final    NO GROWTH 2 DAYS Performed at [facility], [place] [address] [place], [place] [address]    Report Status PENDING  Incomplete  Blood culture (routine x 2)      Status: None (Preliminary result)   Collection Time: 03/04/22  2:30 AM   Specimen: BLOOD  Result Value Ref Range Status   Specimen Description BLOOD LEFT FOREARM  Final   Special Requests   Final    BOTTLES DRAWN AEROBIC AND ANAEROBIC Blood Culture results may not be optimal due to an inadequate volume of blood received in culture bottles   Culture   Final    NO GROWTH 2 DAYS Performed at [facility], [place] [address], [place], [place] [address]    Report Status PENDING  Incomplete  Respiratory (~20 pathogens) panel by PCR     Status: None   Collection Time: 03/04/22  5:05 AM   Specimen: Nasopharyngeal Swab; Respiratory  Result Value Ref Range Status   Adenovirus NOT DETECTED NOT DETECTED Final   Coronavirus 229E NOT DETECTED NOT DETECTED Final    Comment: (NOTE) The Coronavirus on the Respiratory Panel, DOES NOT test for the novel  Coronavirus (2019 nCoV)    Coronavirus HKU1 NOT DETECTED NOT DETECTED Final   Coronavirus NL63 NOT DETECTED NOT DETECTED Final   Coronavirus OC43 NOT DETECTED NOT DETECTED Final   Metapneumovirus NOT DETECTED NOT DETECTED Final   Rhinovirus / Enterovirus NOT DETECTED NOT DETECTED Final   Influenza A NOT DETECTED NOT DETECTED Final   Influenza B NOT DETECTED NOT DETECTED Final   Parainfluenza Virus 1 NOT DETECTED NOT DETECTED Final   Parainfluenza Virus 2 NOT DETECTED NOT DETECTED Final   Parainfluenza Virus 3 NOT DETECTED NOT DETECTED Final   Parainfluenza Virus 4 NOT DETECTED NOT DETECTED Final   Respiratory Syncytial Virus NOT DETECTED NOT DETECTED Final   Bordetella pertussis NOT DETECTED NOT DETECTED Final   Bordetella Parapertussis NOT DETECTED NOT DETECTED Final   Chlamydophila pneumoniae NOT DETECTED NOT DETECTED Final   Mycoplasma pneumoniae NOT DETECTED NOT DETECTED Final    Comment: Performed at [facility], [place]. [address] [place], [place] [address]  MRSA Next Gen  by PCR, Nasal     Status: None   Collection Time: 03/04/22   5:07 PM   Specimen: Nasal Mucosa; Nasal Swab  Result Value Ref Range Status   MRSA by PCR Next Gen NOT DETECTED NOT DETECTED Final    Comment: (NOTE) The GeneXpert MRSA Assay (FDA approved for NASAL specimens only), is one component of a comprehensive MRSA colonization surveillance program. It is not intended to diagnose MRSA infection nor to guide or monitor treatment for MRSA infections. Test performance is not FDA approved in patients less than 63 years old. Performed at [facility], [place] [address], [place], [place] [address]   Resp Panel by RT-PCR (Flu A&B, Covid) Nasal Mucosa     Status: None   Collection Time: 03/04/22  5:40 PM   Specimen: Nasal Mucosa; Nasal Swab  Result Value Ref Range Status   SARS Coronavirus 2 by RT PCR NEGATIVE NEGATIVE Final    Comment: (NOTE) SARS-CoV-2 target nucleic acids are NOT DETECTED.  The SARS-CoV-2 RNA is generally detectable in upper respiratory specimens during the acute phase of infection. The lowest concentration of SARS-CoV-2 viral copies this assay can detect is 138 copies/mL. A negative result does not preclude SARS-Cov-2 infection and should not be used as the sole basis for treatment or other patient management decisions. A negative result may occur with  improper specimen collection/handling, submission of specimen other than nasopharyngeal swab, presence of viral mutation(s) within the areas targeted by this assay, and inadequate number of viral copies(<138 copies/mL). A negative result must be combined with clinical observations, patient history, and epidemiological information. The expected result is Negative.  Fact Sheet for Patients:  EntrepreneurPulse.com.au  Fact Sheet for Healthcare Providers:  IncredibleEmployment.be  This test is no t yet approved or cleared by the [place] FDA and  has been authorized for detection and/or diagnosis of SARS-CoV-2 by FDA under an  Emergency Use Authorization (EUA). This EUA will remain  in effect (meaning this test can be used) for the duration of the COVID-19 declaration under Section 564(b)(1) of the Act, 21 U.S.C.section 360bbb-3(b)(1), unless the authorization is terminated  or revoked sooner.       Influenza A by PCR NEGATIVE NEGATIVE Final   Influenza B by PCR NEGATIVE NEGATIVE Final    Comment: (NOTE) The Xpert Xpress SARS-CoV-2/FLU/RSV plus assay is intended as an aid in the diagnosis of influenza from Nasopharyngeal swab specimens and should not be used as a sole basis for treatment. Nasal washings and aspirates are unacceptable for Xpert Xpress SARS-CoV-2/FLU/RSV testing.  Fact Sheet for Patients: EntrepreneurPulse.com.au  Fact Sheet for Healthcare Providers: IncredibleEmployment.be  This test is not yet approved or cleared by the [place] FDA and has been authorized for detection and/or diagnosis of SARS-CoV-2 by FDA under an Emergency Use Authorization (EUA). This EUA will remain in effect (meaning this test can be used) for the duration of the COVID-19 declaration under Section 564(b)(1) of the Act, 21 U.S.C. section 360bbb-3(b)(1), unless the authorization is terminated or revoked.  Performed at [facility], [place] [address] [place], [place] [address]       Radiology Studies: DG CHEST PORT 1 VIEW  Result Date: 03/06/2022 CLINICAL DATA:  Aspiration into the airway. EXAM: PORTABLE CHEST 1 VIEW COMPARISON:  None Available. FINDINGS: Heart is enlarged. Left IJ Port-A-Cath is stable. Extremity related again noted. Diffuse interstitial pattern has increased. No significant consolidation is present. IMPRESSION: 1. Cardiomegaly with increasing interstitial pattern suggesting edema. 2. No  significant consolidation. Electronically Signed   By: San Morelle M.D.   On: 03/06/2022 08:16       LOS: 2 days   Geradine Girt  [facility] Pager on [email or web address]  03/06/2022, 12:29 PM
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
## 2022-03-06 NOTE — Progress Notes (Signed)
SLP Cancellation Note  Patient Details Name: Mallory Ayers MRN: [id number] DOB: [birth year]   Cancelled treatment:       Reason Eval/Treat Not Completed: Medical issues which prohibited therapy (Pt's case discussed with [name], RN who advised that the pt has had hematemesis and that she is now NPO with plan is for a possible GI procedure later today. Swallow evaluation therefore deferred, but SLP may be contacted it pt becomes appropriate later.)  Anthon Harpole I. Hardin Negus, [place], [place] Office number [phone]  Horton Marshall 03/06/2022, 9:35 AM

## 2022-03-06 NOTE — Progress Notes (Signed)
Patient's mentation remains intact, she now complains of pain 8/10 midabdominal area, she describes it as dull and achie.  Acute, new since this admission. See new orders.  Dr. Sidney Ace at bedside now.
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
## 2022-03-06 NOTE — Progress Notes (Addendum)
Critical care note:  Date of note: 03/06/2022  Subjective: The patient had an acute onset of upper GI bleeding of about 175 mL of bright red blood without clots.  She was alert and cooperative but lethargic and pale.  She dropped her pulse oximetry to the 60s requiring 15 L of O2 that was tapered down to 6 L with pulse oximetry of 100%.  She was also tachycardic to 106 earlier and later 102 with temperature of 99 and BP 113/65 and later 114/71 and respiratory rate 18.  Objective: Physical examination: Generally: Acutely ill middle-aged lethargic Caucasian female in mild respiratory distress with conversational dyspnea. Vital signs as above. Head - atraumatic, normocephalic.  Pupils - equal, round and reactive to light and accommodation. Extraocular movements are intact. No scleral icterus.  Oropharynx - moist mucous membranes and tongue. No pharyngeal erythema or exudate.  Neck - supple. No JVD. Carotid pulses 2+ bilaterally. No carotid bruits. No palpable thyromegaly or lymphadenopathy. Cardiovascular - regular rate and rhythm. Normal S1 and S2. No murmurs, gallops or rubs.  Lungs -diminished bibasal breath sounds with bibasal and slight midlung zone crackles. Abdomen - soft and nontender. Positive bowel sounds. No palpable organomegaly or masses.  Extremities -she is status post her bilateral AKA. Neuro - grossly non-focal.  She was alert and oriented x3 but very lethargic and slow to respond to questions. Skin - no rashes. Breast, pelvic and rectal - deferred.  Labs and notes were reviewed. - Stat portable chest x-ray showed worsening bilateral pulmonary infiltrates concerning for aspiration versus worsening HCAP.  Assessment/plan: 1.  Acute hypoxic respiratory failure that could be related to microaspiration and aspiration pneumonia on top of her HCAP due to acute upper GI bleeding in the setting of hepatic cirrhosis likely related to [place]. - The patient was placed on 15 L of O2 by  nasal cannula that was later on tapered to 6 L. - We will start on IV Protonix with bolus and drip. - We will start her on IV octreotide with bolus and drip. - We will hold off her aspirin and Eliquis. - We will follow serial H&H. - She will need GI consultation this morning.   - Dr. Magdalen Spatz was notified about the patient and is aware. - We will place her on hydration with IV normal saline. - She is covered for pneumonia with Augmentin that should cover anaerobes with aspiration.  Authorized and performed by: Eugenie Norrie, MD Total critical care time: Approximately   35    minutes. Due to a high probability of clinically significant, life-threatening deterioration, the patient required my highest level of preparedness to intervene emergently and I personally spent this critical care time directly and personally managing the patient.  This critical care time included obtaining a history, examining the patient, pulse oximetry, ordering and review of studies, arranging urgent treatment with development of management plan, evaluation of patient's response to treatment, frequent reassessment, and discussions with other providers. This critical care time was performed to assess and manage the high probability of imminent, life-threatening deterioration that could result in multiorgan failure.  It was exclusive of separately billable procedures and treating other patients and teaching time.

## 2022-03-06 NOTE — TOC CAGE-AID Note (Signed)
Transition of Care [facility]) - CAGE-AID Screening   Patient Details  Name: Mallory Ayers MRN: [id number] Date of Birth: [birth year]  Elvina Sidle, RN Trauma Response Nurse Phone Number: [phone] 03/06/2022, 11:12 AM       CAGE-AID Screening:    Have You Ever Felt You Ought to Cut Down on Your Drinking or Drug Use?: Yes Have People Annoyed You By Critizing Your Drinking Or Drug Use?: Yes Have You Felt Bad Or Guilty About Your Drinking Or Drug Use?: Yes Have You Ever Had a Drink or Used Drugs First Thing In The Morning to Steady Your Nerves or to Get Rid of a Hangover?: No CAGE-AID Score: 3  Substance Abuse Education Offered: No (pt has a hx of alcohol abuse, states has "quit drinking after my heart attack"-- in Aug-- hx of cirrhosis)

## 2022-03-06 NOTE — Progress Notes (Signed)
At approximately 0600, patient's SO came out to hall calling for help.   Patient was actively vomiting, bright red blood.   Airway was secured, suction provided. Patient placed on 6LNC to assist.   [place] response called, [facility] informed.

## 2022-03-06 NOTE — Progress Notes (Signed)
Vomitting brownish red emessis, 264m. Sitting up in bed. Assisted with suction. Dr VEliseo Squiresarriving to bedside.

## 2022-03-06 NOTE — Progress Notes (Signed)
Physical Therapy Treatment Patient Details Name: Mallory Ayers MRN: [id number] DOB: [birth year] Today's Date: 03/06/2022   History of Present Illness Pt is a 51 y/o female admitted after a fall while transferring to power chair. Head/neck CT showed mildly displaced fx of L articular facet of C7 and L lamina of C6. Neurosx recommends nonoperative mgmt. 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    PT Comments    Progressing to OOB this session with assist for balance, safety and due to weakness.  She had episodes of desaturation during mobility while on 3L O2, but returned to 90's with seated rest.  Daughter present and other family visiting and supportive.  Continue to feel STSNF level rehab appropriate.  PT will continue to follow.    Recommendations for follow up therapy are one component of a multi-disciplinary discharge planning process, led by the attending physician.  Recommendations may be updated based on patient status, additional functional criteria and insurance authorization.  Follow Up Recommendations  Skilled nursing-short term rehab (<3 hours/day) Can patient physically be transported by private vehicle: No   Assistance Recommended at Discharge Frequent or constant Supervision/Assistance  Patient can return home with the following A lot of help with walking and/or transfers;A lot of help with bathing/dressing/bathroom   Equipment Recommendations  None recommended by PT    Recommendations for Other Services       Precautions / Restrictions Precautions Precautions: Fall;Other (comment);Cervical;ICD/Pacemaker Precaution Comments: B AKA w/o prosthetics Required Braces or Orthoses: Cervical Brace Cervical Brace: Hard collar     Mobility  Bed Mobility Overal bed mobility: Needs Assistance Bed Mobility: Supine to Sit     Supine to sit: HOB elevated, Supervision Sit to supine: Supervision, HOB elevated   General bed mobility  comments: assisted with elevating HOB to ensure not feeling ill or light headed after vomiting blood earlier today; pt using bed rails to pull to sit, to supine pt leaning back on her own after HOB up    Transfers Overall transfer level: Needs assistance   Transfers: Bed to chair/wheelchair/BSC         Anterior-Posterior transfers: +2 safety/equipment, Min assist   General transfer comment: increased time, scooing hip to hip with lateral leans and min A due to fatigue stopping to rest several times and SpO2 drop to 70's back into 90's with rest breaks on 3L O2 throughout; back to bed from [facility] pt tipping up onto her residual limbs and scooting around to sit up in bed, daughter present and supporting for safety and reports that is how she does it at home.    Ambulation/Gait                   Stairs             Wheelchair Mobility    Modified Rankin (Stroke Patients Only)       Balance Overall balance assessment: Needs assistance   Sitting balance-Leahy Scale: Good Sitting balance - Comments: able to balance during scooting efforts, leaning forwad onto bed for PT to assist with toilet hygiene                                    Cognition Arousal/Alertness: Awake/alert Behavior During Therapy: WFL for tasks assessed/performed, Flat affect Overall Cognitive Status: Impaired/Different from baseline Area of Impairment: Awareness, Problem solving  Awareness: Emergent Problem Solving: Slow processing General Comments: eager to get up to [facility], had vomited large amount of blood earlier, tempered to try sitting with HOB up first to ensure no further N&V or BP drop        Exercises      General Comments General comments (skin integrity, edema, etc.): RN in and out of room, SpO2 down to 77 on 3L during scooting, with stopping to rest back to 90's quickly.      Pertinent Vitals/Pain Pain Assessment Pain  Assessment: Faces Faces Pain Scale: Hurts a little bit Pain Location: abdomen Pain Descriptors / Indicators: Cramping Pain Intervention(s): Monitored during session, Other (comment) (assisted to [facility])    Home Living                          Prior Function            PT Goals (current goals can now be found in the care plan section) Progress towards PT goals: Progressing toward goals    Frequency    Min 3X/week      PT Plan Current plan remains appropriate    Co-evaluation              AM-PAC PT "6 Clicks" Mobility   Outcome Measure  Help needed turning from your back to your side while in a flat bed without using bedrails?: A Little Help needed moving from lying on your back to sitting on the side of a flat bed without using bedrails?: A Little Help needed moving to and from a bed to a chair (including a wheelchair)?: A Lot Help needed standing up from a chair using your arms (e.g., wheelchair or bedside chair)?: Total Help needed to walk in hospital room?: Total Help needed climbing 3-5 steps with a railing? : Total 6 Click Score: 11    End of Session   Activity Tolerance: Patient limited by fatigue Patient left: in bed;with call bell/phone within reach;with family/visitor present   PT Visit Diagnosis: Other abnormalities of gait and mobility (R26.89);Muscle weakness (generalized) (M62.81)     Time: 4503-8882 PT Time Calculation (min) (ACUTE ONLY): 24 min  Charges:  $Therapeutic Activity: 23-37 mins                     Magda Kiel, PT Acute Rehabilitation Services Office:[phone] 03/06/2022    Reginia Naas 03/06/2022, 5:58 PM

## 2022-03-07 ENCOUNTER — Inpatient Hospital Stay (HOSPITAL_COMMUNITY): Payer: Medicare HMO

## 2022-03-07 DIAGNOSIS — K922 Gastrointestinal hemorrhage, unspecified: Secondary | ICD-10-CM | POA: Diagnosis not present

## 2022-03-07 DIAGNOSIS — J189 Pneumonia, unspecified organism: Secondary | ICD-10-CM | POA: Diagnosis not present

## 2022-03-07 DIAGNOSIS — K7682 Hepatic encephalopathy: Secondary | ICD-10-CM | POA: Diagnosis not present

## 2022-03-07 DIAGNOSIS — W19XXXA Unspecified fall, initial encounter: Secondary | ICD-10-CM | POA: Diagnosis not present

## 2022-03-07 DIAGNOSIS — K7031 Alcoholic cirrhosis of liver with ascites: Secondary | ICD-10-CM | POA: Diagnosis not present

## 2022-03-07 DIAGNOSIS — Z7189 Other specified counseling: Secondary | ICD-10-CM | POA: Diagnosis not present

## 2022-03-07 LAB — BASIC METABOLIC PANEL
Anion gap: 9 (ref 5–15)
BUN: 25 mg/dL — ABNORMAL HIGH (ref 6–20)
CO2: 17 mmol/L — ABNORMAL LOW (ref 22–32)
Calcium: 7.4 mg/dL — ABNORMAL LOW (ref 8.9–10.3)
Chloride: 108 mmol/L (ref 98–111)
Creatinine, Ser: 0.94 mg/dL (ref 0.44–1.00)
GFR, Estimated: >60 mL/min (ref >60)
Glucose, Bld: 117 mg/dL — ABNORMAL HIGH (ref 70–99)
Potassium: 3.1 mmol/L — ABNORMAL LOW (ref 3.5–5.1)
Sodium: 134 mmol/L — ABNORMAL LOW (ref 135–145)

## 2022-03-07 LAB — BLOOD GAS, ARTERIAL
Acid-base deficit: 9.2 mmol/L — ABNORMAL HIGH (ref 0.0–2.0)
Bicarbonate: 15 mmol/L — ABNORMAL LOW (ref 20.0–28.0)
Drawn by: 34762
O2 Saturation: 88.2 %
Patient temperature: 36.5
pCO2 arterial: 25 mmHg — ABNORMAL LOW (ref 32–48)
pH, Arterial: 7.38 (ref 7.35–7.45)
pO2, Arterial: 53 mmHg — ABNORMAL LOW (ref 83–108)

## 2022-03-07 LAB — CBC
HCT: 22.2 % — ABNORMAL LOW (ref 36.0–46.0)
Hemoglobin: 7.5 g/dL — ABNORMAL LOW (ref 12.0–15.0)
MCH: 32.2 pg (ref 26.0–34.0)
MCHC: 33.8 g/dL (ref 30.0–36.0)
MCV: 95.3 fL (ref 80.0–100.0)
Platelets: 136 10*3/uL — ABNORMAL LOW (ref 150–400)
RBC: 2.33 MIL/uL — ABNORMAL LOW (ref 3.87–5.11)
RDW: 21.7 % — ABNORMAL HIGH (ref 11.5–15.5)
WBC: 8.5 10*3/uL (ref 4.0–10.5)
nRBC: 0 % (ref 0.0–0.2)

## 2022-03-07 LAB — PROTIME-INR
INR: 2.5 — ABNORMAL HIGH (ref 0.8–1.2)
Prothrombin Time: 27 seconds — ABNORMAL HIGH (ref 11.4–15.2)

## 2022-03-07 MED ORDER — HYDROXYZINE HCL 10 MG PO TABS
10.0000 mg | ORAL_TABLET | Freq: Three times a day (TID) | ORAL | Status: DC | PRN
  Administered 2022-03-08: 10 mg via ORAL
  Filled 2022-03-07 (×2): qty 1

## 2022-03-07 MED ORDER — FUROSEMIDE 10 MG/ML IJ SOLN
INTRAMUSCULAR | Status: AC
  Filled 2022-03-07: qty 2

## 2022-03-07 MED ORDER — SODIUM CHLORIDE 0.9 % IV SOLN
3.0000 g | Freq: Three times a day (TID) | INTRAVENOUS | Status: DC
  Administered 2022-03-07 – 2022-03-08 (×3): 3 g via INTRAVENOUS
  Filled 2022-03-07 (×4): qty 8

## 2022-03-07 MED ORDER — IPRATROPIUM-ALBUTEROL 0.5-2.5 (3) MG/3ML IN SOLN
3.0000 mL | Freq: Four times a day (QID) | RESPIRATORY_TRACT | Status: DC
  Administered 2022-03-07 (×2): 3 mL via RESPIRATORY_TRACT
  Filled 2022-03-07 (×2): qty 3

## 2022-03-07 MED ORDER — POTASSIUM CHLORIDE 10 MEQ/100ML IV SOLN
10.0000 meq | INTRAVENOUS | Status: AC
  Administered 2022-03-07 (×3): 10 meq via INTRAVENOUS
  Filled 2022-03-07 (×3): qty 100

## 2022-03-07 MED ORDER — POTASSIUM CHLORIDE 10 MEQ/100ML IV SOLN
10.0000 meq | INTRAVENOUS | Status: AC
  Administered 2022-03-07 (×2): 10 meq via INTRAVENOUS

## 2022-03-07 MED ORDER — POTASSIUM CHLORIDE 10 MEQ/100ML IV SOLN
10.0000 meq | INTRAVENOUS | Status: DC
  Filled 2022-03-07 (×3): qty 100

## 2022-03-07 MED ORDER — LORAZEPAM 2 MG/ML IJ SOLN
1.0000 mg | Freq: Four times a day (QID) | INTRAMUSCULAR | Status: DC | PRN
  Administered 2022-03-07: 1 mg via INTRAVENOUS
  Filled 2022-03-07: qty 1

## 2022-03-07 MED ORDER — FENTANYL CITRATE PF 50 MCG/ML IJ SOSY
50.0000 ug | PREFILLED_SYRINGE | INTRAMUSCULAR | Status: AC | PRN
  Administered 2022-03-07 (×2): 50 ug via INTRAVENOUS
  Filled 2022-03-07 (×2): qty 1

## 2022-03-07 MED ORDER — VITAMIN K1 10 MG/ML IJ SOLN
10.0000 mg | Freq: Every day | INTRAVENOUS | Status: DC
  Administered 2022-03-07: 10 mg via INTRAVENOUS
  Filled 2022-03-07 (×2): qty 1

## 2022-03-07 MED ORDER — FUROSEMIDE 10 MG/ML IJ SOLN
20.0000 mg | Freq: Once | INTRAMUSCULAR | Status: AC
  Administered 2022-03-07: 20 mg via INTRAVENOUS

## 2022-03-07 MED ORDER — FENTANYL CITRATE PF 50 MCG/ML IJ SOSY
12.5000 ug | PREFILLED_SYRINGE | INTRAMUSCULAR | Status: AC | PRN
  Administered 2022-03-07 (×2): 12.5 ug via INTRAVENOUS
  Filled 2022-03-07 (×2): qty 1

## 2022-03-07 NOTE — Progress Notes (Signed)
[facility] ROUNDING NOTE   Subjective: Patient had episode of choking and coughing last night.  Reports some dyspnea and poor sleep.  Is much more alert this morning compared to yesterday. Reports that nausea is better, denies further vomiting. Denies abdominal pain. Had a liquid bowel movement this morning which was brown in color per nursing staff.      Objective: Vital signs in last 24 hours: Temp:  [98.1 F (36.7 C)-99.1 F (37.3 C)] 98.1 F (36.7 C) (11/30 0747) Pulse Rate:  [88-102] 92 (11/30 0747) Resp:  [20-31] 26 (11/30 0747) BP: (91-125)/(58-78) 103/58 (11/30 0747) SpO2:  [92 %-100 %] 95 % (11/30 0828) Last BM Date : 03/06/22 General: Ill appearing Caucasian fm, lying bed, cervical collar Lungs:  B/l crackles, no wheezing Heart:  RRR, no m/r/g Abdomen: obese, Soft, NT, ND, +BS Ext: S/p b/l AKA Neuro:  No asterixis.  Alert and interactive     Intake/Output from previous day: 11/29 0701 - 11/30 0700 In: 2611.2 [I.V.:2611.2] Out: 800 [Urine:800] Intake/Output this shift: No intake/output data recorded.   Lab Results: Recent Labs    03/06/22 0557 03/06/22 1320 03/06/22 1807 03/07/22 0717  WBC 13.7*  --   --  8.5  HGB 8.9* 8.0* 7.8* 7.5*  PLT 149*  --   --  136*  MCV 90.6  --   --  95.3   BMET Recent Labs    03/05/22 0413 03/06/22 0557  NA 128* 126*  K 4.5 4.1  CL  --  97*  CO2  --  18*  GLUCOSE  --  112*  BUN  --  22*  CREATININE  --  1.03*  CALCIUM  --  7.6*   LFT Recent Labs    03/06/22 0557  PROT 5.4*  ALBUMIN 1.8*  AST 65*  ALT 11  ALKPHOS 241*  BILITOT 4.0*   PT/INR No results for input(s): "INR" in the last 72 hours.  MELD 3.0: 27 at 03/07/2022  8:32 AM MELD-Na: 24 at 03/07/2022  8:32 AM Calculated from: Serum Creatinine: 0.94 mg/dL (Using min of 1 mg/dL) at 03/07/2022  7:17 AM Serum Sodium: 134 mmol/L at 03/07/2022  7:17 AM Total Bilirubin: 4.0 mg/dL at 03/06/2022  5:57 AM Serum Albumin: 1.8 g/dL at  03/06/2022  5:57 AM INR(ratio): 2.5 at 03/07/2022  8:32 AM Age at listing (hypothetical): 51 years Sex: Female at 03/07/2022  8:32 AM  Imaging/Other results: IR ABDOMEN US LIMITED  Result Date: 03/06/2022 CLINICAL DATA:  Evaluation of ascites for possible paracentesis. EXAM: LIMITED ABDOMEN ULTRASOUND FOR ASCITES TECHNIQUE: Limited ultrasound survey for ascites was performed in all four abdominal quadrants. COMPARISON:  CT on 03/04/2022 FINDINGS: Survey of all 4 quadrants of the peritoneal cavity by ultrasound shows a small volume of intraperitoneal ascites. A large enough pocket was not present to allow for paracentesis. IMPRESSION: Small volume of ascites by ultrasound. Paracentesis is unable to be performed currently. Electronically Signed   By: Aletta Edouard M.D.   On: 03/06/2022 16:55   DG CHEST PORT 1 VIEW  Result Date: 03/06/2022 CLINICAL DATA:  Aspiration into the airway. EXAM: PORTABLE CHEST 1 VIEW COMPARISON:  None Available. FINDINGS: Heart is enlarged. Left IJ Port-A-Cath is stable. Extremity related again noted. Diffuse interstitial pattern has increased. No significant consolidation is present. IMPRESSION: 1. Cardiomegaly with increasing interstitial pattern suggesting edema. 2. No significant consolidation. Electronically Signed   By: San Morelle M.D.   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 - Potential EGD tomorrow following family meeting with palliative care and establishing goals of care - Recommend albumin for volume replacement - Transfuse for hgb <7 - Continue Protonix gtt/octreotide gtt - Consider changing antibiotic to IV ceftriaxone for gi bleeding prophylaxis  - Ok with CLD today if cleared by speech path - Hold anticoagulation given UGIB  Hepatic encephalopathy Improved compared to yesterday - Continue lactulose 20 g BID, titrating to 3 BM/day - Limit narcotics/sedatives as much as possible given HE   Daryel November, MD  03/07/2022, 9:15 AM [facility]

## 2022-03-07 NOTE — Progress Notes (Addendum)
Called to bedside by RN for increased work with respirations.  ABG pending. Xray done with what appears to be pulmonary edema.  Placing foley and giving lasix IV x 1 to see response.  Current SBP is in the 130s.  K was low this AM so will also ensure that is replaced via IV.  Called daughter to determine how aggressive she would like [place] to be in regards to intubation as do not think patient would tolerate BIPAP.  If daughter would want her to be intubated, will consult PCCM if she does not respond to IV lasix with improvement in respiratory picture.   Poor overall prognosis in light of her GI bleeding, cirrhosis, cancer and recent cardiac arrest.  Eulogio Bear DO   Was able to reach [facility] and discussed CODE status.  Daughter is agreeable to DNR (which is appropriate).  She wishes to continue medical therapies including abx and O2.  [name] would like to be notified if patient's condition worsens as she will need to travel from [place].  Continued palliative care discussion are needed as I do not expect patient to survive hospitalization.  JV

## 2022-03-07 NOTE — Progress Notes (Signed)
11/30 Because patient in under airborne precautions, I called patient's significant Mallory Ayers) other to let him know that I would be mailing a copy of the IMM Letter to the address listing on file for the patient's review.

## 2022-03-07 NOTE — Progress Notes (Signed)
ABG sent to lab and notified.

## 2022-03-07 NOTE — Progress Notes (Signed)
[facility] PROGRESS NOTE   Mallory Ayers GEX:[id number] DOB: Aug 10, 1970 DOA: 02/13/2022  PCP: [name], MD  Brief History/Interval Summary: 51 y.o. 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  Patient then on 11/24  has PPM/ICD implanted without complication and was discharged home on 03/02/22. Patient at home had fall while completing wheel chair transfer s/p which she sustained trauma to her head and neck. Patient notes continued pain in neck as well as her chronic pain.  Imaging studies raised concern for cervical spine injury and fracture.  She was hospitalized for further management.  Hospital stay complicated by vomiting of blood.    Consultants: Neurosurgery, GI, palliative care      Subjective/Interval History: Coughing and feeling like she is suffocating    Assessment/Plan:  Cervical spine injury secondary to mechanical fall Patient found to have mild compression fracture of C7.  Fracture of the articular facet on the left at C7 fracture of the lamina at C6.  Other injuries also noted on imaging studies. Patient currently has a soft cervical collar in place. Patient seen by neurosurgery.  No plans for surgical intervention.  Continue with cervical collar.  They would like to follow the patient in their office in 2 weeks time. Pain control.  Hematemesis -GI consult -octreotide and protonix gtt -family to meet with palliative care- if comfort care is not ultimate decision, EGD will be pursued  Poor overall prognosis -palliative care consult for [place]  Fatty liver and likely cirrhosis. No changes of portal hypertension on recent CT scans. These do reveal ascites.  -paracentesis unable to be done due to small volume  Rib fracture/sternal  fracture secondary to fall Seen by trauma service.  No interventions recommended.  Pain control.  Incentive spirometry.  Healthcare associated pneumonia Patient found to have bilateral infiltrates on imaging studies.  She was recently hospitalized for ICD placement which was done on 11/24. She was intubated for this procedure. She could have aspirated. COVID-19 influenza PCR negative.  Respiratory viral panel negative. MRSA PCR negative. Augmentin -wean O2 as able to off  Hepatic encephalopathy Patient noted to have asterixis.  This could have contributed to lack of coordination while picking up things.  Ammonia level is noted to be elevated at 58.   -lactulose started- titrate to 3 BMs per day  Mild acute kidney injury with metabolic acidosis She was given IV fluids in the emergency department.  Continue to monitor urine output.  Avoid nephrotoxic agents. -trend  Hyponatremia - Likely due to liver cirrhosis. monitor  Normocytic anemia plus ABLA from GI bled -trend -transfuse for <7  Recent cardiac arrest/history of ventricular tachycardia She was on a LifeVest and then had ICD/pacemaker placed on 11/24 by cardiology. Echocardiogram from August showed normal left ventricular systolic function.  History of pulmonary embolism Eliquis was on hold for ICD placement.  Hold eliquis  History of breast cancer. She is s/p mastectomy.  Chemotherapy was in June 2023.  Has a left Port-A-Cath.  Follows with oncology in [place]. -poor overall prognosis per daughter  Obesity Estimated body mass index is 49.88 kg/m as calculated from the following:   Height as of this encounter: '3\' 10"'$  (1.168 m).   Weight as of this encounter: 68.1 kg.   History  of obstructive sleep apnea Not on CPAP.  Recent C. difficile infection Completed course of antibiotics about 2 weeks ago.  No diarrhea at this time.  Hypothyroidism Continue levothyroxine.  Chronic pain syndrome Takes oxycodone and  Dilaudid at home for pain relief.  Bowel regimen.  Has been started on lactulose.  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  DVT Prophylaxis: scd Code Status: Full code Family Communication: fiance at bedside Disposition Plan: pending  Status is: Inpatient Remains inpatient appropriate because: Cervical spine injury, rib fracture      Medications: Scheduled:  amoxicillin-clavulanate  1 tablet Oral Q12H   busPIRone  10 mg Oral BID   Chlorhexidine Gluconate Cloth  6 each Topical Daily   FLUoxetine  60 mg Oral Daily   influenza vac split quadrivalent PF  0.5 mL Intramuscular Tomorrow-1000   ipratropium-albuterol  3 mL Nebulization Q6H   lactulose  20 g Oral BID   levothyroxine  100 mcg Oral Q0600   lidocaine  1 patch Transdermal Q24H   magnesium oxide  400 mg Oral Daily   mometasone-formoterol  2 puff Inhalation BID   multivitamin with minerals  1 tablet Oral q morning   [START ON 03/09/2022] pantoprazole  40 mg Intravenous Q12H   pneumococcal 23 valent vaccine  0.5 mL Intramuscular Tomorrow-1000   potassium chloride SA  20 mEq Oral BID   pregabalin  225 mg Oral BID   saccharomyces boulardii  250 mg Oral BID   sodium chloride flush  10-40 mL Intracatheter Q12H   Continuous:  sodium chloride 150 mL/hr at 03/06/22 0641   sodium chloride 100 mL/hr at 03/06/22 2103   octreotide (SANDOSTATIN) 500 mcg in sodium chloride 0.9 % 250 mL (2 mcg/mL) infusion 50 mcg/hr (03/07/22 0332)   pantoprazole 8 mg/hr (03/07/22 0333)   URK:[id number], methocarbamol, mouth rinse, oxyCODONE    Objective:  Vital Signs  Vitals:   03/07/22 0642 03/07/22 0747 03/07/22 0828 03/07/22 1016  BP: 118/70 (!) 103/58    Pulse: 98 92    Resp: (!) 31 (!) 26    Temp:  98.1 F (36.7 C)    TempSrc:  Oral    SpO2: 92% 98% 95% 96%  Weight:      Height:        Intake/Output Summary (Last 24 hours) at 03/07/2022 1039 Last data filed at  03/07/2022 0800 Gross per 24 hour  Intake 4078.85 ml  Output 800 ml  Net 3278.85 ml   Filed Weights   02/10/2022 2236 03/05/22 1600  Weight: 67.6 kg 68.1 kg     General: Appearance:    Severely obese female in no acute distress     Lungs:     respirations unlabored  Heart:    Normal heart rate.   MS:    B/l amputations  Neurologic:   Awake, alert- pleasant      Lab Results:  Data Reviewed: I have personally reviewed following labs and reports of the imaging studies  CBC: Recent Labs  Lab 02/25/2022 2250 03/04/22 0450 03/04/22 0503 03/05/22 0413 03/06/22 0557 03/06/22 1320 03/06/22 1807 03/07/22 0717  WBC 12.1* 9.5  --   --  13.7*  --   --  8.5  NEUTROABS 9.8* 7.5  --   --   --   --   --   --   HGB 9.4* 8.5*   < > 9.2* 8.9* 8.0* 7.8* 7.5*  HCT 27.5* 25.6*   < >  27.0* 25.1* 23.5* 22.3* 22.2*  MCV 94.2 96.2  --   --  90.6  --   --  95.3  PLT 160 131*  --   --  149*  --   --  136*   < > = values in this interval not displayed.    Basic Metabolic Panel: Recent Labs  Lab 02/19/2022 2250 03/04/22 0450 03/04/22 0503 03/05/22 0413 03/06/22 0557 03/07/22 0717  NA 130* 129* 128* 128* 126* 134*  K 3.6 3.5 3.5 4.5 4.1 3.1*  CL 97* 100  --   --  97* 108  CO2 17* 18*  --   --  18* 17*  GLUCOSE 127* 124*  --   --  112* 117*  BUN 8 10  --   --  22* 25*  CREATININE 1.18* 1.14*  --   --  1.03* 0.94  CALCIUM 8.1* 7.5*  --   --  7.6* 7.4*  MG  --  1.7  --   --  1.9  --     GFR: Estimated Creatinine Clearance: 39.8 mL/min (by C-G formula based on SCr of 0.94 mg/dL).  Liver Function Tests: Recent Labs  Lab 02/06/2022 2250 03/04/22 0450 03/06/22 0557  AST 91* 82* 65*  ALT '12 11 11  '$ ALKPHOS 229* 232* 241*  BILITOT 2.1* 1.8* 4.0*  PROT 5.8* 5.4* 5.4*  ALBUMIN 2.1* 1.9* 1.8*     Recent Results (from the past 240 hour(s))  Blood culture (routine x 2)     Status: None (Preliminary result)   Collection Time: 03/04/22  2:30 AM   Specimen: BLOOD  Result Value Ref Range  Status   Specimen Description BLOOD LEFT ARM  Final   Special Requests   Final    BOTTLES DRAWN AEROBIC AND ANAEROBIC Blood Culture results may not be optimal due to an inadequate volume of blood received in culture bottles   Culture   Final    NO GROWTH 3 DAYS Performed at [facility], [place] [address] [place], [place] [address]    Report Status PENDING  Incomplete  Blood culture (routine x 2)     Status: None (Preliminary result)   Collection Time: 03/04/22  2:30 AM   Specimen: BLOOD  Result Value Ref Range Status   Specimen Description BLOOD LEFT FOREARM  Final   Special Requests   Final    BOTTLES DRAWN AEROBIC AND ANAEROBIC Blood Culture results may not be optimal due to an inadequate volume of blood received in culture bottles   Culture   Final    NO GROWTH 3 DAYS Performed at [facility], [place] [address], [place], [place] [address]    Report Status PENDING  Incomplete  Respiratory (~20 pathogens) panel by PCR     Status: None   Collection Time: 03/04/22  5:05 AM   Specimen: Nasopharyngeal Swab; Respiratory  Result Value Ref Range Status   Adenovirus NOT DETECTED NOT DETECTED Final   Coronavirus 229E NOT DETECTED NOT DETECTED Final    Comment: (NOTE) The Coronavirus on the Respiratory Panel, DOES NOT test for the novel  Coronavirus (2019 nCoV)    Coronavirus HKU1 NOT DETECTED NOT DETECTED Final   Coronavirus NL63 NOT DETECTED NOT DETECTED Final   Coronavirus OC43 NOT DETECTED NOT DETECTED Final   Metapneumovirus NOT DETECTED NOT DETECTED Final   Rhinovirus / Enterovirus NOT DETECTED NOT DETECTED Final   Influenza A NOT DETECTED NOT DETECTED Final   Influenza B NOT DETECTED  NOT DETECTED Final   Parainfluenza Virus 1 NOT DETECTED NOT DETECTED Final   Parainfluenza Virus 2 NOT DETECTED NOT DETECTED Final   Parainfluenza Virus 3 NOT DETECTED NOT DETECTED Final   Parainfluenza Virus 4 NOT DETECTED NOT DETECTED Final   Respiratory Syncytial Virus NOT DETECTED NOT  DETECTED Final   Bordetella pertussis NOT DETECTED NOT DETECTED Final   Bordetella Parapertussis NOT DETECTED NOT DETECTED Final   Chlamydophila pneumoniae NOT DETECTED NOT DETECTED Final   Mycoplasma pneumoniae NOT DETECTED NOT DETECTED Final    Comment: Performed at [facility], [place] [address], [place], [place] [address]  MRSA Next Gen by PCR, Nasal     Status: None   Collection Time: 03/04/22  5:07 PM   Specimen: Nasal Mucosa; Nasal Swab  Result Value Ref Range Status   MRSA by PCR Next Gen NOT DETECTED NOT DETECTED Final    Comment: (NOTE) The GeneXpert MRSA Assay (FDA approved for NASAL specimens only), is one component of a comprehensive MRSA colonization surveillance program. It is not intended to diagnose MRSA infection nor to guide or monitor treatment for MRSA infections. Test performance is not FDA approved in patients less than 89 years old. Performed at [facility], [place] [address], [place], [place] [address]   Resp Panel by RT-PCR (Flu A&B, Covid) Nasal Mucosa     Status: None   Collection Time: 03/04/22  5:40 PM   Specimen: Nasal Mucosa; Nasal Swab  Result Value Ref Range Status   SARS Coronavirus 2 by RT PCR NEGATIVE NEGATIVE Final    Comment: (NOTE) SARS-CoV-2 target nucleic acids are NOT DETECTED.  The SARS-CoV-2 RNA is generally detectable in upper respiratory specimens during the acute phase of infection. The lowest concentration of SARS-CoV-2 viral copies this assay can detect is 138 copies/mL. A negative result does not preclude SARS-Cov-2 infection and should not be used as the sole basis for treatment or other patient management decisions. A negative result may occur with  improper specimen collection/handling, submission of specimen other than nasopharyngeal swab, presence of viral mutation(s) within the areas targeted by this assay, and inadequate number of viral copies(<138 copies/mL). A negative result must be combined with clinical  observations, patient history, and epidemiological information. The expected result is Negative.  Fact Sheet for Patients:  EntrepreneurPulse.com.au  Fact Sheet for Healthcare Providers:  IncredibleEmployment.be  This test is no t yet approved or cleared by the [place] FDA and  has been authorized for detection and/or diagnosis of SARS-CoV-2 by FDA under an Emergency Use Authorization (EUA). This EUA will remain  in effect (meaning this test can be used) for the duration of the COVID-19 declaration under Section 564(b)(1) of the Act, 21 U.S.C.section 360bbb-3(b)(1), unless the authorization is terminated  or revoked sooner.       Influenza A by PCR NEGATIVE NEGATIVE Final   Influenza B by PCR NEGATIVE NEGATIVE Final    Comment: (NOTE) The Xpert Xpress SARS-CoV-2/FLU/RSV plus assay is intended as an aid in the diagnosis of influenza from Nasopharyngeal swab specimens and should not be used as a sole basis for treatment. Nasal washings and aspirates are unacceptable for Xpert Xpress SARS-CoV-2/FLU/RSV testing.  Fact Sheet for Patients: EntrepreneurPulse.com.au  Fact Sheet for Healthcare Providers: IncredibleEmployment.be  This test is not yet approved or cleared by the [place] FDA and has been authorized for detection and/or diagnosis of SARS-CoV-2 by FDA under an Emergency Use Authorization (EUA). This EUA will remain in effect (meaning  this test can be used) for the duration of the COVID-19 declaration under Section 564(b)(1) of the Act, 21 U.S.C. section 360bbb-3(b)(1), unless the authorization is terminated or revoked.  Performed at [facility], [place] [address], [place], [place] [address]       Radiology Studies: IR ABDOMEN US LIMITED  Result Date: 03/06/2022 CLINICAL DATA:  Evaluation of ascites for possible paracentesis. EXAM: LIMITED ABDOMEN ULTRASOUND FOR ASCITES  TECHNIQUE: Limited ultrasound survey for ascites was performed in all four abdominal quadrants. COMPARISON:  CT on 03/04/2022 FINDINGS: Survey of all 4 quadrants of the peritoneal cavity by ultrasound shows a small volume of intraperitoneal ascites. A large enough pocket was not present to allow for paracentesis. IMPRESSION: Small volume of ascites by ultrasound. Paracentesis is unable to be performed currently. Electronically Signed   By: Aletta Edouard M.D.   On: 03/06/2022 16:55   DG CHEST PORT 1 VIEW  Result Date: 03/06/2022 CLINICAL DATA:  Aspiration into the airway. EXAM: PORTABLE CHEST 1 VIEW COMPARISON:  None Available. FINDINGS: Heart is enlarged. Left IJ Port-A-Cath is stable. Extremity related again noted. Diffuse interstitial pattern has increased. No significant consolidation is present. IMPRESSION: 1. Cardiomegaly with increasing interstitial pattern suggesting edema. 2. No significant consolidation. Electronically Signed   By: San Morelle M.D.   On: 03/06/2022 08:16       LOS: 3 days   Geradine Girt  [facility] Pager on [email or web address]  03/07/2022, 10:39 AM

## 2022-03-07 NOTE — Progress Notes (Signed)
   03/07/22 0113  Urine Characteristics  Bladder Scan Volume (mL) 574 mL   [facility] messaged regarding retention. See new orders.

## 2022-03-07 NOTE — Progress Notes (Signed)
Daily Progress Note   Patient Name: Mallory Ayers       Date: 03/07/2022 DOB: 1971-02-28  Age: 51 y.o. MRN#: [id number] Attending Physician: Geradine Girt, DO Primary Care Physician: [name], MD Admit Date: 02/25/2022  Reason for Consultation/Follow-up: Establishing goals of care  Subjective: Feeling very anxious - unable to discuss [place] - RN administering ativan at bedside  Length of Stay: 3  Current Medications: Scheduled Meds:   busPIRone  10 mg Oral BID   Chlorhexidine Gluconate Cloth  6 each Topical Daily   FLUoxetine  60 mg Oral Daily   influenza vac split quadrivalent PF  0.5 mL Intramuscular Tomorrow-1000   ipratropium-albuterol  3 mL Nebulization Q6H   lactulose  20 g Oral BID   levothyroxine  100 mcg Oral Q0600   lidocaine  1 patch Transdermal Q24H   magnesium oxide  400 mg Oral Daily   mometasone-formoterol  2 puff Inhalation BID   multivitamin with minerals  1 tablet Oral q morning   [START ON 03/09/2022] pantoprazole  40 mg Intravenous Q12H   pneumococcal 23 valent vaccine  0.5 mL Intramuscular Tomorrow-1000   potassium chloride SA  20 mEq Oral BID   pregabalin  225 mg Oral BID   saccharomyces boulardii  250 mg Oral BID   sodium chloride flush  10-40 mL Intracatheter Q12H    Continuous Infusions:  sodium chloride 150 mL/hr at 03/06/22 0641   ampicillin-sulbactam (UNASYN) IV     octreotide (SANDOSTATIN) 500 mcg in sodium chloride 0.9 % 250 mL (2 mcg/mL) infusion 50 mcg/hr (03/07/22 0332)   pantoprazole 8 mg/hr (03/07/22 0333)   potassium chloride 10 mEq (03/07/22 1402)    PRN Meds: HYDROmorphone, hydrOXYzine, LORazepam, methocarbamol, mouth rinse, oxyCODONE  Physical Exam Constitutional:      Appearance: She is ill-appearing.     Comments: Restless/anxious  appearing  Pulmonary:     Effort: Pulmonary effort is normal.  Skin:    General: Skin is warm and dry.  Neurological:     Comments: UTA r/t anxiety             Vital Signs: BP 99/64 (BP Location: Left Arm)   Pulse 100   Temp 98 F (36.7 C) (Oral)   Resp (!) 24   Ht _0  (1.168 m) Comment: 5'10 in the past/bilateral AKA  Wt 68.1 kg   LMP 03/08/2014   SpO2 92%   BMI 49.88 kg/m  SpO2: SpO2: 92 % O2 Device: O2 Device: Nasal Cannula O2 Flow Rate: O2 Flow Rate (L/min): 6 L/min  Intake/output summary:  Intake/Output Summary (Last 24 hours) at 03/07/2022 1419 Last data filed at 03/07/2022 0800 Gross per 24 hour  Intake 4078.85 ml  Output 800 ml  Net 3278.85 ml   LBM: Last BM Date : 03/06/22 Baseline Weight: Weight: 67.6 kg Most recent weight: Weight: 68.1 kg       Palliative Assessment/Data: PPS 30%      Patient Active Problem List   Diagnosis Date Noted   Acute upper GI bleeding 03/06/2022   Aspiration pneumonia ([place]) 03/06/2022   HCAP (healthcare-associated pneumonia) 03/04/2022   ICD (implantable cardioverter-defibrillator) in place 03/01/2022   C. difficile colitis  02/01/2022   Abnormal CT scan, colon    Chronic anticoagulation    Nausea and vomiting    Hypomagnesemia 12/24/2021   Cellulitis 12/23/2021   Long QT syndrome 12/20/2021   C. difficile diarrhea 12/19/2021   Hypoalbuminemia 12/19/2021   Polymorphic ventricular tachycardia (HCC)    Acute respiratory failure with hypoxia ([place])    History of cardiac arrest 12/05/2021   Dehydration 11/27/2021   Genetic testing 10/02/2021   Abnormal CT of liver 09/24/2021   Diarrhea 09/12/2021   Decreased appetite 09/12/2021   Family history of breast cancer 08/10/2021   Seasonal allergic rhinitis 06/19/2021   Atopic dermatitis 06/19/2021   Chronic obstructive pulmonary disease ([place]) 06/19/2021   Encounter for surveillance of contraceptive pills 06/19/2021   Encounter for therapeutic drug level monitoring  06/19/2021   Gastro-esophageal reflux disease without esophagitis 06/19/2021   Generalized anxiety disorder 06/19/2021   Herpes zoster without complication 56/25/6389   Hypokalemia 06/19/2021   Impacted cerumen 06/19/2021   Insomnia 06/19/2021   Major depression, single episode 06/19/2021   Melena 06/19/2021   Menopause 06/19/2021   Other vitamin B12 deficiency anemias 06/19/2021   Phantom limb syndrome with pain ([place]) 06/19/2021   Rash and other nonspecific skin eruption 06/19/2021   Recurrent major depression in remission ([place]) 06/19/2021   Right hip pain 06/19/2021   Type 2 diabetes mellitus without complications ([place]) 37/34/2876   Unspecified lump in the right breast, unspecified quadrant 06/19/2021   Vitamin B12 deficiency anemia due to malabsorption with proteinuria 06/19/2021   Vitamin D deficiency 06/19/2021   Postoperative examination 06/05/2021   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   Malignant neoplasm of upper-outer quadrant of right breast in female, estrogen receptor positive ([place]) 03/27/2021   Other pulmonary embolism without acute cor pulmonale ([place]) 04/23/2018   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 03/30/2014   Chronic pain syndrome 03/30/2014   History of pulmonary embolism 03/30/2014   Sliding hiatal hernia 03/30/2014   Hypothyroidism 03/30/2014   Mixed hyperlipidemia 03/30/2014   Elevated transaminase level 03/30/2014   Hepatic steatosis 03/30/2014   Essential hypertension 03/30/2014   Amputee, 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 Found to have mild compression fracture of C7. Developed hematemesis during hospitalization. Also have to have likely cirrhosis with elevated ammonia, hepatic encephalopathy. Diagnosed with HCAP. PMT consulted to discuss [place].    Assessment: I have reviewed medical records including EPIC notes, labs and imaging, received report from RN, assessed the patient and then met with patient's mother, daughter, brother, significant other, and sister-in-law to discuss diagnosis prognosis, GOC, EOL wishes, disposition and options.  I introduced Palliative Medicine as specialized medical care for people living with serious illness. It focuses on providing relief from the symptoms and stress of a serious illness. The goal is to improve quality of life for both the patient and the family.  Family reports a rapid decline since patient's cardiac arrest August 30 of this year.  They tell me she is lost  significant amount of her function.  She used to be able to transfer herself independently and can no longer do that.  They also tell me a very poor appetite and they tell me the little bit that she does consume she usually vomits.  They also tell me of cognitive decline.  Lots of memory loss and difficulty understanding.  Family tells me about patient's multiple hospitalizations recently.  They tell me about her breast cancer diagnosis and chemotherapy that she was unable to tolerate.   We discussed patient's current illness and what it means in the larger context of patient's on-going co-morbidities.  Natural disease trajectory and expectations at EOL were discussed.  We discussed patient's failure to thrive.  We discussed her decompensated cirrhosis.  We discussed her upper GI bleed.    I attempted to elicit values and goals of care important to the patient.    The difference between aggressive medical intervention and  comfort care was considered in light of the patient's goals of care.   We discussed considering whether or not to proceed with EGD.  We discussed considering CODE STATUS.  We also discussed considering rehospitalization following discharge as it is expected that patient will have ongoing complications.  At this point family is unsure how to proceed.  They mention including patient in conversation but they all also state they do not feel she has the capacity to make medical decisions independently.  We discussed that patient has a HCPOA document naming her daughter as HCPOA.  We discussed at this point patient certainly is unable to make decisions as she has just received anxiety medication.  We discussed I will attempt to discuss situation with her tomorrow but again family shares they do not feel she can make decisions independently.  I discussed after speaking with her I would reach out to family after giving them time to process her conversation today and see how they are feeling about moving forward with her care.  Discussed with family the importance of continued conversation with family and the medical providers regarding overall plan of care and treatment options, ensuring decisions are within the context of the patient's values and GOCs.    Palliative Care services outpatient were explained and offered. She has already been connected to  outpatient services.   Questions and concerns were addressed. The family was encouraged to call with questions or concerns.    Recommendations/Plan: Family does not feel patient has capacity to independently make medical decisions for herself and I was unable to assess as she had just received anxiety medication Family would like time to consider conversation today and we will follow up again tomorrow - they are considering: whether or not to move forward with EGD, code status, if patient would want rehospitalization after discharge  Code Status: Full  code  Discharge Planning: [facility] for rehab with Palliative care service follow-up  Care plan was discussed with multiple family members, Dr. Eliseo Squires  Thank you for allowing the Palliative Medicine Team to assist in the care of this patient.   *Please note that this is a verbal dictation therefore any spelling or grammatical errors are due to the "[place] [employer]" system interpretation.  Juel Burrow, DNP, [facility] Palliative Medicine Team Team Phone # [phone]  Pager [phone]
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
## 2022-03-07 NOTE — Care Management Important Message (Signed)
Important Message  Patient Details  Name: Mallory Ayers MRN: [id number] Date of Birth: 02-06-1971   Medicare Important Message Given:  Other (see comment)     Hannah Beat 03/07/2022, 9:24 AM

## 2022-03-08 DIAGNOSIS — K7031 Alcoholic cirrhosis of liver with ascites: Secondary | ICD-10-CM | POA: Diagnosis not present

## 2022-03-08 DIAGNOSIS — J189 Pneumonia, unspecified organism: Secondary | ICD-10-CM | POA: Diagnosis not present

## 2022-03-08 DIAGNOSIS — Z515 Encounter for palliative care: Secondary | ICD-10-CM | POA: Diagnosis not present

## 2022-03-08 DIAGNOSIS — S129XXA Fracture of neck, unspecified, initial encounter: Secondary | ICD-10-CM | POA: Diagnosis not present

## 2022-03-08 LAB — AMMONIA: Ammonia: 52 umol/L — ABNORMAL HIGH (ref 9–35)

## 2022-03-08 LAB — BASIC METABOLIC PANEL
Anion gap: 8 (ref 5–15)
BUN: 27 mg/dL — ABNORMAL HIGH (ref 6–20)
CO2: 17 mmol/L — ABNORMAL LOW (ref 22–32)
Calcium: 7.5 mg/dL — ABNORMAL LOW (ref 8.9–10.3)
Chloride: 109 mmol/L (ref 98–111)
Creatinine, Ser: 1.05 mg/dL — ABNORMAL HIGH (ref 0.44–1.00)
GFR, Estimated: >60 mL/min (ref >60)
Glucose, Bld: 146 mg/dL — ABNORMAL HIGH (ref 70–99)
Potassium: 3.8 mmol/L (ref 3.5–5.1)
Sodium: 134 mmol/L — ABNORMAL LOW (ref 135–145)

## 2022-03-08 LAB — CBC
HCT: 23.4 % — ABNORMAL LOW (ref 36.0–46.0)
Hemoglobin: 7.7 g/dL — ABNORMAL LOW (ref 12.0–15.0)
MCH: 31.7 pg (ref 26.0–34.0)
MCHC: 32.9 g/dL (ref 30.0–36.0)
MCV: 96.3 fL (ref 80.0–100.0)
Platelets: 133 10*3/uL — ABNORMAL LOW (ref 150–400)
RBC: 2.43 MIL/uL — ABNORMAL LOW (ref 3.87–5.11)
RDW: 21.6 % — ABNORMAL HIGH (ref 11.5–15.5)
WBC: 7.5 10*3/uL (ref 4.0–10.5)
nRBC: 0 % (ref 0.0–0.2)

## 2022-03-08 LAB — PROTIME-INR
INR: 1.9 — ABNORMAL HIGH (ref 0.8–1.2)
Prothrombin Time: 21.3 seconds — ABNORMAL HIGH (ref 11.4–15.2)

## 2022-03-08 MED ORDER — MORPHINE BOLUS VIA INFUSION: 2.0000 mg | INTRAVENOUS | Status: DC | PRN

## 2022-03-08 MED ORDER — MORPHINE BOLUS VIA INFUSION: 1.0000 mg | INTRAVENOUS | Status: DC | PRN

## 2022-03-08 MED ORDER — FENTANYL CITRATE PF 50 MCG/ML IJ SOSY
25.0000 ug | PREFILLED_SYRINGE | INTRAMUSCULAR | Status: DC | PRN
  Administered 2022-03-08 (×2): 50 ug via INTRAVENOUS
  Filled 2022-03-08 (×2): qty 1

## 2022-03-08 MED ORDER — LORAZEPAM 2 MG/ML IJ SOLN
1.0000 mg | INTRAMUSCULAR | Status: DC | PRN
  Administered 2022-03-08: 1 mg via INTRAVENOUS

## 2022-03-08 MED ORDER — HALOPERIDOL LACTATE 5 MG/ML IJ SOLN: 2.0000 mg | Freq: Once | INTRAMUSCULAR | Status: DC

## 2022-03-08 MED ORDER — LORAZEPAM 2 MG/ML PO CONC: 1.0000 mg | ORAL | Status: DC | PRN

## 2022-03-08 MED ORDER — MORPHINE 100MG IN NS 100ML (1MG/ML) PREMIX INFUSION
1.0000 mg/h | INTRAVENOUS | Status: DC
  Administered 2022-03-08: 1 mg/h via INTRAVENOUS
  Filled 2022-03-08: qty 100

## 2022-03-08 MED ORDER — BIOTENE DRY MOUTH MT LIQD
15.0000 mL | OROMUCOSAL | Status: DC
  Administered 2022-03-08: 15 mL via OROMUCOSAL

## 2022-03-08 MED ORDER — HALOPERIDOL LACTATE 5 MG/ML IJ SOLN
2.0000 mg | INTRAMUSCULAR | Status: AC | PRN
  Administered 2022-03-08: 2 mg via INTRAVENOUS
  Filled 2022-03-08: qty 1

## 2022-03-08 MED ORDER — FENTANYL CITRATE PF 50 MCG/ML IJ SOSY
25.0000 ug | PREFILLED_SYRINGE | INTRAMUSCULAR | Status: DC | PRN
  Administered 2022-03-08: 50 ug via INTRAVENOUS
  Filled 2022-03-08: qty 1

## 2022-03-08 MED ORDER — FENTANYL CITRATE PF 50 MCG/ML IJ SOSY: 25.0000 ug | PREFILLED_SYRINGE | INTRAMUSCULAR | Status: DC | PRN

## 2022-03-08 MED ORDER — FENTANYL CITRATE PF 50 MCG/ML IJ SOSY
25.0000 ug | PREFILLED_SYRINGE | Freq: Once | INTRAMUSCULAR | Status: AC
  Administered 2022-03-08: 25 ug via INTRAVENOUS
  Filled 2022-03-08: qty 1

## 2022-03-08 MED ORDER — LORAZEPAM 1 MG PO TABS: 1.0000 mg | ORAL_TABLET | ORAL | Status: DC | PRN

## 2022-03-08 MED ORDER — HALOPERIDOL LACTATE 5 MG/ML IJ SOLN
2.0000 mg | INTRAMUSCULAR | Status: DC | PRN
  Filled 2022-03-08: qty 1

## 2022-03-08 MED ORDER — LORAZEPAM 2 MG/ML IJ SOLN
1.0000 mg | INTRAMUSCULAR | Status: DC | PRN
  Administered 2022-03-08: 1 mg via INTRAVENOUS
  Filled 2022-03-08 (×2): qty 1

## 2022-03-08 DEATH — deceased

## 2022-03-09 LAB — CULTURE, BLOOD (ROUTINE X 2)
Culture: NO GROWTH
Culture: NO GROWTH

## 2022-03-11 ENCOUNTER — Ambulatory Visit: Payer: Medicare HMO | Admitting: Oncology

## 2022-03-11 LAB — VITAMIN B1: Vitamin B1 (Thiamine): 46.6 nmol/L — ABNORMAL LOW (ref 66.5–200.0)

## 2022-03-19 ENCOUNTER — Encounter: Payer: Medicare HMO | Admitting: Physician Assistant

## 2022-03-21 ENCOUNTER — Encounter: Payer: Self-pay | Admitting: Oncology

## 2022-03-26 ENCOUNTER — Encounter: Payer: Medicare HMO | Admitting: Cardiology

## 2022-04-08 NOTE — Progress Notes (Signed)
Patient now mad comfort care. Ok to d/c collar from our standpoint. Continue supportive measures.

## 2022-04-08 NOTE — Progress Notes (Signed)
Dtg spoke with with NP- [place] to start. Dtg spoke with other family and agreed to implement total comfort for the patient--" we don't want her to suffer any longer". Family tearful and acknowledged understanding of current plan. SRP,RN

## 2022-04-08 NOTE — Progress Notes (Signed)
Dtg-- Mallory Ayers has arrived along with several other family members from Mother, son, brother Elenor Legato, niece and in laws accompany. Pt response to name and pulls at mask then out again. Pt continues to be restless, using accessory muscle to breath with respiration 28 to 30 throughout the night. Family remain close to pt, NP updated of dtg concerns and pt status. SRP RN

## 2022-04-08 NOTE — Progress Notes (Signed)
SLP Cancellation Note  Patient Details Name: Mallory Ayers MRN: [id number] DOB: 1971/01/22   Cancelled treatment:       Reason Eval/Treat Not Completed: Other (comment) (Pt has been transitioned to full comfort care and SLP orders have therefore been cancelled.)  Mallory Jankowiak I. Hardin Negus, [place], [place] Office number [phone]  Horton Marshall 25-Mar-2022, 9:06 AM

## 2022-04-08 NOTE — Death Summary Note (Signed)
DEATH SUMMARY   Patient Details  Name: Mallory Ayers MRN: [id number] DOB: April 25, 1970 AJG:OTLXB, Rosalyn Charters, MD Admission/Discharge Information   Admit Date:  2022-03-12  Date of Death: Date of Death: 2022-03-17  Time of Death: Time of Death: 27-Jun-1300  Length of Stay: 4   Principle Cause of death: complications from cirrhosis   Hospital Diagnoses: Principal Problem:   HCAP (healthcare-associated pneumonia) Active Problems:   Acute upper GI bleeding   Aspiration pneumonia [facility])   Hospital Course: Cervical spine injury secondary to mechanical fall Patient found to have mild compression fracture of C7.  Fracture of the articular facet on the left at C7 fracture of the lamina at C6.  Other injuries also noted on imaging studies. -transitioned to comfort care   Hematemesis -GI consult -octreotide and protonix gtt -not stable for EGD and transitioned to comfort care     Fatty liver and likely cirrhosis. No changes of portal hypertension on recent CT scans. These do reveal ascites.  -paracentesis unable to be done due to small volume of fluid -transitioned to comfort care   Rib fracture/sternal fracture secondary to fall Seen by trauma service.  No interventions recommended.  Pain control.  Incentive spirometry. -transitioned to comfort care   Healthcare associated pneumonia Patient found to have bilateral infiltrates on imaging studies.  She was recently hospitalized for ICD placement which was done on 11/24. -transitioned to comfort care   Hepatic encephalopathy Patient noted to have asterixis.  This could have contributed to lack of coordination while picking up things.  -transitioned to comfort care   Mild acute kidney injury with metabolic acidosis -transitioned to comfort care   Hyponatremia -Normocytic anemia plus ABLA from GI bled Recent cardiac arrest/history of ventricular tachycardia History of pulmonary embolism History of breast cancer.     Obesity Estimated  body mass index is 49.88 kg/m as calculated from the following:   Height as of this encounter: '3\' 10"'$  (1.168 m).   Weight as of this encounter: 68.1 kg.    History of obstructive sleep apnea Recent C. 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   Significant Diagnostic Studies: DG CHEST PORT 1 VIEW  Result Date: 03/07/2022 CLINICAL DATA:  Dyspnea EXAM: PORTABLE CHEST 1 VIEW COMPARISON:  Chest radiograph from the prior day. FINDINGS: The heart is enlarged. A cardiac device overlying the left chest and sternum is redemonstrated. A left internal jugular central venous port catheter tip overlies the superior cavoatrial junction. Moderate-to-severe bilateral interstitial and airspace opacities have increased since prior exam. Small pleural effusions may contribute. There is no pneumothorax. Degenerative changes are seen in the spine. IMPRESSION: Moderate-to-severe bilateral interstitial and airspace opacities, increased since prior exam. Small pleural effusions may contribute. Electronically Signed   By: Zerita Boers M.D.   On: 03/07/2022 16:45   IR ABDOMEN US LIMITED  Result Date: 03/06/2022 CLINICAL DATA:  Evaluation of ascites for possible paracentesis. EXAM: LIMITED ABDOMEN ULTRASOUND FOR ASCITES TECHNIQUE: Limited ultrasound survey for ascites was performed in all four abdominal quadrants. COMPARISON:  CT on 03/04/2022 FINDINGS: Survey of all 4 quadrants of the peritoneal cavity by ultrasound shows a small volume of intraperitoneal ascites. A large enough pocket was not present to allow for paracentesis. IMPRESSION: Small volume of  ascites by ultrasound. Paracentesis is unable to be  performed currently. Electronically Signed   By: Aletta Edouard M.D.   On: 03/06/2022 16:55   DG CHEST PORT 1 VIEW  Result Date: 03/06/2022 CLINICAL DATA:  Aspiration into the airway. EXAM: PORTABLE CHEST 1 VIEW COMPARISON:  None Available. FINDINGS: Heart is enlarged. Left IJ Port-A-Cath is stable. Extremity related again noted. Diffuse interstitial pattern has increased. No significant consolidation is present. IMPRESSION: 1. Cardiomegaly with increasing interstitial pattern suggesting edema. 2. No significant consolidation. Electronically Signed   By: San Morelle M.D.   On: 03/06/2022 08:16   CT CHEST ABDOMEN PELVIS W CONTRAST  Result Date: 03/04/2022 CLINICAL DATA:  Status post fall. EXAM: CT CHEST, ABDOMEN, AND PELVIS WITH CONTRAST TECHNIQUE: Multidetector CT imaging of the chest, abdomen and pelvis was performed following the standard protocol during bolus administration of intravenous contrast. RADIATION DOSE REDUCTION: This exam was performed according to the departmental dose-optimization program which includes automated exposure control, adjustment of the mA and/or kV according to patient size and/or use of iterative reconstruction technique. CONTRAST:  58m OMNIPAQUE IOHEXOL 350 MG/ML SOLN COMPARISON:  February 12, 2022 FINDINGS: CT CHEST FINDINGS Cardiovascular: There is stable left-sided venous Port-A-Cath positioning. Mild calcification of the aortic arch is noted, without evidence of aortic aneurysm or dissection. Normal heart size with marked severity coronary artery calcification. No pericardial effusion. Mediastinum/Nodes: No enlarged mediastinal, hilar, or axillary lymph nodes. Thyroid gland, trachea, and esophagus demonstrate no significant findings. Lungs/Pleura: Moderate severity multifocal infiltrates are seen throughout both lungs. This is slightly more prominent on the left and represents a new finding when compared to the prior study. There is no evidence of a pleural  effusion or pneumothorax. Musculoskeletal: A mild amount of subcutaneous air is seen anterior to the body of the sternum, to the left of midline. A mild amount of soft tissue air is also seen along the posterior and lateral aspects of the lower left ribcage. A single lead ventricular pacer is noted within the posterolateral aspect of the lower left chest wall. This represents a new finding when compared to the prior study. Numerous chronic bilateral rib fractures are seen. A new lateral second left rib fracture is noted (axial CT image 26, CT series 6). A chronic fracture is seen involving the inferior aspect of the body of the sternum. A new fracture of the anterior aspect of the mid to upper body of the sternum is noted. There is a chronic compression fracture deformity at the level of T9. A new compression fracture deformity is seen involving predominantly the superior endplate of the T5 vertebral body. CT ABDOMEN PELVIS FINDINGS Hepatobiliary: The liver is cirrhotic, with diffuse heterogeneous appearance of the liver parenchyma. Status post cholecystectomy. No biliary dilatation. Pancreas: Unremarkable. No pancreatic ductal dilatation or surrounding inflammatory changes. Spleen: Normal in size without focal abnormality. Adrenals/Urinary Tract: Adrenal glands are unremarkable. Kidneys are normal, without renal calculi, focal lesion, or hydronephrosis. The urinary bladder is markedly distended and is otherwise unremarkable. Stomach/Bowel: Surgical sutures are seen throughout the gastric region. The appendix is not clearly identified. No evidence of bowel wall thickening, distention, or inflammatory changes. Vascular/Lymphatic: Aortic atherosclerosis. No enlarged abdominal or pelvic lymph nodes. Reproductive: Uterus and bilateral adnexa are unremarkable. Other: No abdominal wall hernia or abnormality. There is moderate severity abdominopelvic ascites. This is increased in severity when compared to the prior study.  Musculoskeletal: No acute or significant osseous findings. IMPRESSION: 1. Moderate severity bilateral multifocal infiltrates. 2. Interval single lead  ventricular pacer placement since the prior exam. 3. Acute lateral second left rib fracture with an acute fracture of the anterior aspect of the mid to upper body of the sternum. 4. Chronic compression fracture deformity at the level of T9. 5. New compression fracture deformity involving the superior endplate of the T5 vertebral body. 6. Hepatic cirrhosis. 7. Moderate severity abdominopelvic ascites, increased in severity when compared to the prior study. 8. 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 9. Aortic atherosclerosis. Aortic Atherosclerosis (ICD10-I70.0). Electronically Signed   By: Virgina Norfolk M.D.   On: 03/04/2022 01:50   CT HEAD WO CONTRAST (5MM)  Result Date: 02/16/2022 CLINICAL DATA:  Recent fall with headaches and chest pain, initial encounter EXAM: CT HEAD WITHOUT CONTRAST CT CERVICAL SPINE WITHOUT CONTRAST TECHNIQUE: Multidetector CT imaging of the head and cervical spine was performed following the standard protocol without intravenous contrast. Multiplanar CT image reconstructions of the cervical spine were also generated. RADIATION DOSE REDUCTION: This exam was performed according to the departmental dose-optimization program which includes automated exposure control, adjustment of the mA and/or kV according to patient size and/or use of iterative reconstruction technique. COMPARISON:  12/05/2021, 02/12/2022 FINDINGS: CT HEAD FINDINGS Brain: No evidence of acute infarction, hemorrhage, hydrocephalus, extra-axial collection or mass lesion/mass effect. Atrophic changes are noted. Vascular: No hyperdense vessel or unexpected calcification. Skull: Normal. Negative for fracture or focal lesion. Sinuses/Orbits: No acute finding. Other: None. CT CERVICAL SPINE FINDINGS Alignment: Loss of the normal cervical lordosis is noted.  Skull base and vertebrae: 7 cervical segments are well visualized. Increased kyphosis is noted centered at the C6 level. Mild compression deformity at C7 is noted new from a prior CT from 02/12/2022. There is a mildly displaced fracture involving the left superior articular facet at C7. Additionally there is a fracture involving the left lamina at C6. Avulsion from the inferior most aspect of the left inferior articular facet is noted at C5. This is best seen on image number 39 of series 11. No other definitive fractures are seen. The odontoid is within normal limits. Mild osteophytic changes and facet hypertrophic changes are noted. Soft tissues and spinal canal: Surrounding soft tissue structures are within normal limits. No focal hematoma is seen. Upper chest: Visualized lung apices demonstrate patchy alveolar opacities new from the prior CT examination. Early infiltrate cannot be totally excluded. Other: None IMPRESSION: CT of the head: Chronic atrophic changes without acute intracranial abnormality. CT of the cervical spine: Mild compression fracture at C7 new from the prior exam of 02/12/2022. Additionally there are fractures involving the superior articular facet on the left at C7 as well as the lamina on the left at C6. Small avulsion involving the inferior articular facet at C5 is noted as well. Patchy airspace opacities are noted in the apices bilaterally. Early infiltrate cannot be totally excluded. Critical Value/emergent results were called by telephone at the time of interpretation on 02/27/2022 at 11:53 pm to [facility] , who verbally acknowledged these results. Electronically Signed   By: Inez Catalina M.D.   On: 02/27/2022 23:54   CT Cervical Spine Wo Contrast  Result Date: 02/14/2022 CLINICAL DATA:  Recent fall with headaches and chest pain, initial encounter EXAM: CT HEAD WITHOUT CONTRAST CT CERVICAL SPINE WITHOUT CONTRAST TECHNIQUE: Multidetector CT imaging of the head and cervical  spine was performed following the standard protocol without intravenous contrast. Multiplanar CT image reconstructions of the cervical spine were also generated. RADIATION DOSE REDUCTION: This exam was performed according  to the departmental dose-optimization program which includes automated exposure control, adjustment of the mA and/or kV according to patient size and/or use of iterative reconstruction technique. COMPARISON:  12/05/2021, 02/12/2022 FINDINGS: CT HEAD FINDINGS Brain: No evidence of acute infarction, hemorrhage, hydrocephalus, extra-axial collection or mass lesion/mass effect. Atrophic changes are noted. Vascular: No hyperdense vessel or unexpected calcification. Skull: Normal. Negative for fracture or focal lesion. Sinuses/Orbits: No acute finding. Other: None. CT CERVICAL SPINE FINDINGS Alignment: Loss of the normal cervical lordosis is noted. Skull base and vertebrae: 7 cervical segments are well visualized. Increased kyphosis is noted centered at the C6 level. Mild compression deformity at C7 is noted new from a prior CT from 02/12/2022. There is a mildly displaced fracture involving the left superior articular facet at C7. Additionally there is a fracture involving the left lamina at C6. Avulsion from the inferior most aspect of the left inferior articular facet is noted at C5. This is best seen on image number 39 of series 11. No other definitive fractures are seen. The odontoid is within normal limits. Mild osteophytic changes and facet hypertrophic changes are noted. Soft tissues and spinal canal: Surrounding soft tissue structures are within normal limits. No focal hematoma is seen. Upper chest: Visualized lung apices demonstrate patchy alveolar opacities new from the prior CT examination. Early infiltrate cannot be totally excluded. Other: None IMPRESSION: CT of the head: Chronic atrophic changes without acute intracranial abnormality. CT of the cervical spine: Mild compression fracture at  C7 new from the prior exam of 02/12/2022. Additionally there are fractures involving the superior articular facet on the left at C7 as well as the lamina on the left at C6. Small avulsion involving the inferior articular facet at C5 is noted as well. Patchy airspace opacities are noted in the apices bilaterally. Early infiltrate cannot be totally excluded. Critical Value/emergent results were called by telephone at the time of interpretation on 02/23/2022 at 11:53 pm to [facility], [facility] , who verbally acknowledged these results. Electronically Signed   By: Inez Catalina M.D.   On: 03/04/2022 23:54   DG Chest 2 View  Result Date: 03/02/2022 CLINICAL DATA:  Defibrillator placement. EXAM: CHEST - 2 VIEW COMPARISON:  February 12, 2022. FINDINGS: The heart size and mediastinal contours are within normal limits. Left internal jugular Port-A-Cath is unchanged in position. Interval placement of defibrillator with single lead over cardiac shadow to left of thoracic spine. Minimal bibasilar subsegmental atelectasis is noted. The visualized skeletal structures are unremarkable. IMPRESSION: Interval placement of defibrillator with single lead over cardiac shadow. Minimal bibasilar subsegmental atelectasis. Electronically Signed   By: Marijo Conception M.D.   On: 03/02/2022 08:45   EP PPM/ICD IMPLANT  Result Date: 03/01/2022  CONCLUSIONS:  1.  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 Successful ICD implantation with a [employer] subcutaneous ICD implanted for secondary prevention of sudden death.  3.  No early apparent complications.  4. If her pocket is healing well on examination tomorrow, plan to restart anticoagulation tomorrow evening with close follow-up in the outpatient setting.    Microbiology: Recent Results (from the past 240 hour(s))  Blood culture (routine x 2)     Status: None   Collection Time: 03/04/22  2:30 AM    Specimen: BLOOD  Result Value Ref Range Status   Specimen Description BLOOD LEFT ARM  Final   Special Requests   Final    BOTTLES DRAWN  AEROBIC AND ANAEROBIC Blood Culture results may not be optimal due to an inadequate volume of blood received in culture bottles   Culture   Final    NO GROWTH 5 DAYS Performed at [facility], [place] [address] [place], [place] [address]    Report Status 03/09/2022 FINAL  Final  Blood culture (routine x 2)     Status: None   Collection Time: 03/04/22  2:30 AM   Specimen: BLOOD  Result Value Ref Range Status   Specimen Description BLOOD LEFT FOREARM  Final   Special Requests   Final    BOTTLES DRAWN AEROBIC AND ANAEROBIC Blood Culture results may not be optimal due to an inadequate volume of blood received in culture bottles   Culture   Final    NO GROWTH 5 DAYS Performed at [facility], [place] [address] [place], [place] [address]    Report Status 03/09/2022 FINAL  Final  Respiratory (~20 pathogens) panel by PCR     Status: None   Collection Time: 03/04/22  5:05 AM   Specimen: Nasopharyngeal Swab; Respiratory  Result Value Ref Range Status   Adenovirus NOT DETECTED NOT DETECTED Final   Coronavirus 229E NOT DETECTED NOT DETECTED Final    Comment: (NOTE) The Coronavirus on the Respiratory Panel, DOES NOT test for the novel  Coronavirus (2019 nCoV)    Coronavirus HKU1 NOT DETECTED NOT DETECTED Final   Coronavirus NL63 NOT DETECTED NOT DETECTED Final   Coronavirus OC43 NOT DETECTED NOT DETECTED Final   Metapneumovirus NOT DETECTED NOT DETECTED Final   Rhinovirus / Enterovirus NOT DETECTED NOT DETECTED Final   Influenza A NOT DETECTED NOT DETECTED Final   Influenza B NOT DETECTED NOT DETECTED Final   Parainfluenza Virus 1 NOT DETECTED NOT DETECTED Final   Parainfluenza Virus 2 NOT DETECTED NOT DETECTED Final   Parainfluenza Virus 3 NOT DETECTED NOT DETECTED Final   Parainfluenza Virus 4 NOT DETECTED NOT DETECTED Final   Respiratory  Syncytial Virus NOT DETECTED NOT DETECTED Final   Bordetella pertussis NOT DETECTED NOT DETECTED Final   Bordetella Parapertussis NOT DETECTED NOT DETECTED Final   Chlamydophila pneumoniae NOT DETECTED NOT DETECTED Final   Mycoplasma pneumoniae NOT DETECTED NOT DETECTED Final    Comment: Performed at [facility], [place] [address], [place], [place] [address]  MRSA Next Gen by PCR, Nasal     Status: None   Collection Time: 03/04/22  5:07 PM   Specimen: Nasal Mucosa; Nasal Swab  Result Value Ref Range Status   MRSA by PCR Next Gen NOT DETECTED NOT DETECTED Final    Comment: (NOTE) The GeneXpert MRSA Assay (FDA approved for NASAL specimens only), is one component of a comprehensive MRSA colonization surveillance program. It is not intended to diagnose MRSA infection nor to guide or monitor treatment for MRSA infections. Test performance is not FDA approved in patients less than 35 years old. Performed at [facility], [place] [address], [place], [place] [address]   Resp Panel by RT-PCR (Flu A&B, Covid) Nasal Mucosa     Status: None   Collection Time: 03/04/22  5:40 PM   Specimen: Nasal Mucosa; Nasal Swab  Result Value Ref Range Status   SARS Coronavirus 2 by RT PCR NEGATIVE NEGATIVE Final    Comment: (NOTE) SARS-CoV-2 target nucleic acids are NOT DETECTED.  The SARS-CoV-2 RNA is generally detectable in upper respiratory specimens during the acute phase of infection. The lowest concentration of SARS-CoV-2 viral  copies this assay can detect is 138 copies/mL. A negative result does not preclude SARS-Cov-2 infection and should not be used as the sole basis for treatment or other patient management decisions. A negative result may occur with  improper specimen collection/handling, submission of specimen other than nasopharyngeal swab, presence of viral mutation(s) within the areas targeted by this assay, and inadequate number of viral copies(<138 copies/mL). A negative result must  be combined with clinical observations, patient history, and epidemiological information. The expected result is Negative.  Fact Sheet for Patients:  EntrepreneurPulse.com.au  Fact Sheet for Healthcare Providers:  IncredibleEmployment.be  This test is no t yet approved or cleared by the [place] FDA and  has been authorized for detection and/or diagnosis of SARS-CoV-2 by FDA under an Emergency Use Authorization (EUA). This EUA will remain  in effect (meaning this test can be used) for the duration of the COVID-19 declaration under Section 564(b)(1) of the Act, 21 U.S.C.section 360bbb-3(b)(1), unless the authorization is terminated  or revoked sooner.       Influenza A by PCR NEGATIVE NEGATIVE Final   Influenza B by PCR NEGATIVE NEGATIVE Final    Comment: (NOTE) The Xpert Xpress SARS-CoV-2/FLU/RSV plus assay is intended as an aid in the diagnosis of influenza from Nasopharyngeal swab specimens and should not be used as a sole basis for treatment. Nasal washings and aspirates are unacceptable for Xpert Xpress SARS-CoV-2/FLU/RSV testing.  Fact Sheet for Patients: EntrepreneurPulse.com.au  Fact Sheet for Healthcare Providers: IncredibleEmployment.be  This test is not yet approved or cleared by the [place] FDA and has been authorized for detection and/or diagnosis of SARS-CoV-2 by FDA under an Emergency Use Authorization (EUA). This EUA will remain in effect (meaning this test can be used) for the duration of the COVID-19 declaration under Section 564(b)(1) of the Act, 21 U.S.C. section 360bbb-3(b)(1), unless the authorization is terminated or revoked.  Performed at [facility], [place] [address], [place], [place] [address]     Time spent: 25 minutes  Signed: Geradine Girt, DO 03/12/22

## 2022-04-08 NOTE — Progress Notes (Signed)
NP called to follow up on pt, pt remains restless and exhausted. Responds to name, pt pulls of NON rebreather often, constantly reminding pt every one to two hours leave mask in place. Pt desats to the 70's immediately when O2 mask is removed. Fiance at bedside. Pain med given throughout the night and am. Pt rest about one hour after med administered before waking and tugging to remove mask. Family called to come in with pt. As pt continues to appear exhausted and has been awake the entire shift so far. VS stable, will cont to monitor pt closely. SRP, RN

## 2022-04-08 NOTE — Progress Notes (Signed)
Daily Progress Note   Patient Name: Mallory Ayers       Date: 26-Mar-2022 DOB: [birth year]  Age: 52 y.o. MRN#: [id number] Attending Physician: Geradine Girt, DO Primary Care Physician: [name], MD Admit Date: 02/12/2022  Reason for Consultation/Follow-up: Establishing goals of care  Subjective: Patient unresponsive   Length of Stay: 4  Current Medications: Scheduled Meds:   antiseptic oral rinse  15 mL Mouth Rinse Q4H   lidocaine  1 patch Transdermal Q24H   sodium chloride flush  10-40 mL Intracatheter Q12H    Continuous Infusions:  morphine 10 mg/hr (03/26/2022 1239)    PRN Meds: haloperidol lactate, LORazepam **OR** LORazepam **OR** LORazepam, morphine  Physical Exam Constitutional:      Appearance: She is ill-appearing.     Comments: Unresponsive Slight labored breathing - awaiting morphine infusion, ativan administered  Skin:    General: Skin is warm and dry.             Vital Signs: BP (!) 94/54 (BP Location: Left Arm)   Pulse 94   Temp 98 F (36.7 C) (Axillary)   Resp (!) 32   Ht '3\' 10"'$  (1.168 m)   Wt 68 kg   LMP 03/08/2014   SpO2 100%   BMI 49.81 kg/m  SpO2: SpO2: 100 % O2 Device: O2 Device: NRB O2 Flow Rate: O2 Flow Rate (L/min): 10 L/min  Intake/output summary:  Intake/Output Summary (Last 24 hours) at 2022/03/26 1418 Last data filed at 03/26/2022 1239 Gross per 24 hour  Intake 963.63 ml  Output 750 ml  Net 213.63 ml    LBM: Last BM Date : 03/06/22 Baseline Weight: Weight: 67.6 kg Most recent weight: Weight: 68 kg       Palliative Assessment/Data: PPS 30%      Patient Active Problem List   Diagnosis Date Noted   Acute upper GI bleeding 03/06/2022   Aspiration pneumonia ([place]) 03/06/2022   HCAP (healthcare-associated pneumonia) 03/04/2022   ICD  (implantable cardioverter-defibrillator) in place 03/01/2022   C. difficile colitis 02/01/2022   Abnormal CT scan, colon    Chronic anticoagulation    Nausea and vomiting    Hypomagnesemia 12/24/2021   Cellulitis 12/23/2021   Long QT syndrome 12/20/2021   C. difficile diarrhea 12/19/2021   Hypoalbuminemia 12/19/2021   Polymorphic ventricular tachycardia ([place])    Acute respiratory failure with hypoxia ([place])    History of cardiac arrest 12/05/2021   Dehydration 11/27/2021   Genetic testing 10/02/2021   Abnormal CT of liver 09/24/2021   Diarrhea 09/12/2021   Decreased appetite 09/12/2021   Family history of breast cancer 08/10/2021   Seasonal allergic rhinitis 06/19/2021   Atopic dermatitis 06/19/2021   Chronic obstructive pulmonary disease ([place]) 06/19/2021   Encounter for surveillance of contraceptive pills 06/19/2021   Encounter for therapeutic drug level monitoring 06/19/2021   Gastro-esophageal reflux disease without esophagitis 06/19/2021   Generalized anxiety disorder 06/19/2021   Herpes zoster without complication 19/50/9326   Hypokalemia 06/19/2021   Impacted cerumen 06/19/2021   Insomnia 06/19/2021   Major depression, single episode 06/19/2021   Melena 06/19/2021   Menopause 06/19/2021   Other vitamin B12 deficiency anemias 06/19/2021   Phantom limb syndrome with  pain ([place]) 06/19/2021   Rash and other nonspecific skin eruption 06/19/2021   Recurrent major depression in remission ([place]) 06/19/2021   Right hip pain 06/19/2021   Type 2 diabetes mellitus without complications ([place]) 53/66/4403   Unspecified lump in the right breast, unspecified quadrant 06/19/2021   Vitamin B12 deficiency anemia due to malabsorption with proteinuria 06/19/2021   Vitamin D deficiency 06/19/2021   Postoperative examination 06/05/2021   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   Malignant neoplasm of upper-outer quadrant of right breast in female, estrogen  receptor positive ([place]) 03/27/2021   Other pulmonary embolism without acute cor pulmonale ([place]) 04/23/2018   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 Found to have mild compression fracture of C7. Developed hematemesis during hospitalization. Also have to have likely cirrhosis with elevated ammonia, hepatic encephalopathy. Diagnosed with HCAP. PMT consulted to discuss [place].    Assessment: Patient decompensated overnight and was transitioned to comfort measures only. Multiple family members at bedside - we reviewed measures to ensure comfort. Discussed freeing patient from nonrebreather once morphine infusion is initiated.  Emotional support provided  Recommendations/Plan: Comfort measures only Morphine infusion titratable to symptoms PRN morphine boluses PRN ativan DC cervical collar DC NRB mask once infusion initiated  Code Status: DNR  Discharge Planning: Anticipated Hospital Death  Care plan was discussed with multiple family members, Dr. Eliseo Squires, RN  Thank you for allowing the Palliative Medicine Team to assist in the care of this patient.   *Please note  that this is a verbal dictation therefore any spelling or grammatical errors are due to the "[place]" system interpretation.  Juel Burrow, DNP, [facility] Palliative Medicine Team Team Phone # [phone]  Pager [phone]

## 2022-04-08 NOTE — Progress Notes (Signed)
[facility] PROGRESS NOTE   Mallory Ayers JME:[id number] DOB: 02/22/1971 DOA: 02/27/2022  PCP: [name], MD  Brief History/Interval Summary: 52 y.o. 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  Patient then on 11/24  has PPM/ICD implanted without complication and was discharged home on 03/02/22. Patient at home had fall while completing wheel chair transfer s/p which she sustained trauma to her head and neck. Patient notes continued pain in neck as well as her chronic pain.  Imaging studies raised concern for cervical spine injury and fracture.  She was hospitalized for further management.  Hospital stay complicated by vomiting of blood.  Patient continued to worsen so family elected for comfort care.    Consultants: Neurosurgery, GI, palliative care      Subjective/Interval History: Was getting IV fentanyl PRN but still appearing uncomfortable--has been changed to gtt   Assessment/Plan:  Cervical spine injury secondary to mechanical fall Patient found to have mild compression fracture of C7.  Fracture of the articular facet on the left at C7 fracture of the lamina at C6.  Other injuries also noted on imaging studies. -transitioned to comfort care  Hematemesis -GI consult -octreotide and protonix gtt -transitioned to comfort care   Fatty liver and likely cirrhosis. No changes of portal hypertension on recent CT scans. These do reveal ascites.  -paracentesis unable to be done due to small volume of fluid -transitioned to comfort care  Rib fracture/sternal fracture secondary to fall Seen by trauma service.  No interventions recommended.  Pain control.  Incentive spirometry. -transitioned to comfort care  Healthcare associated pneumonia Patient  found to have bilateral infiltrates on imaging studies.  She was recently hospitalized for ICD placement which was done on 11/24. -transitioned to comfort care  Hepatic encephalopathy Patient noted to have asterixis.  This could have contributed to lack of coordination while picking up things.  -transitioned to comfort care  Mild acute kidney injury with metabolic acidosis -transitioned to comfort care  Hyponatremia -Normocytic anemia plus ABLA from GI bled Recent cardiac arrest/history of ventricular tachycardia History of pulmonary embolism History of breast cancer.   Obesity Estimated body mass index is 49.88 kg/m as calculated from the following:   Height as of this encounter: '3\' 10"'$  (1.168 m).   Weight as of this encounter: 68.1 kg.   History of obstructive sleep apnea Recent C. 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 15 years ago -transitioned to comfort care  DVT Prophylaxis: scd Code Status: Full code Family Communication: multiple at bedside Disposition Plan: -transitioned to comfort care  Status is: Inpatient Remains inpatient appropriate because: in hospital death anticipated       Medications: Scheduled:  antiseptic oral rinse  15 mL Mouth Rinse Q4H   lidocaine  1 patch Transdermal Q24H   sodium chloride flush  10-40 mL Intracatheter Q12H   Continuous:  morphine 5 mg/hr (03/12/2022 1151)   IWL:NLGXQJJHERD lactate, LORazepam **OR** LORazepam **OR** LORazepam, morphine    Objective:  Vital Signs  Vitals:   03/07/22 2038 03/07/22 2306 03-12-2022 0300 12-Mar-2022 0754  BP:  103/65 98/69 (!) 94/54  Pulse: 96 98 99 94  Resp: (!) 30 (!) 25 (!) 22 (!) 32  Temp:  98 F (36.7 C)  97.9 F (36.6 C) 98 F (36.7 C)  TempSrc:  Axillary Oral Axillary  SpO2: 100% 97% 95% 100%  Weight:      Height:        Intake/Output Summary (Last 24 hours) at 03-25-22 1230 Last data filed at Mar 25, 2022  1151 Gross per 24 hour  Intake 1449.3 ml  Output 750 ml  Net 699.3 ml   Filed Weights   02/22/2022 2236 03/05/22 1600  Weight: 67.6 kg 68.1 kg      General: Appearance:    Severely obese female who appears jaundice and uncomfortable     Lungs:     respirations unlabored  Heart:    Normal heart rate.   B/l amputations   Neurologic:   Awake        Lab Results:  Data Reviewed: I have personally reviewed following labs and reports of the imaging studies  CBC: Recent Labs  Lab 02/12/2022 2250 03/04/22 0450 03/04/22 0503 03/06/22 0557 03/06/22 1320 03/06/22 1807 03/07/22 0717 March 25, 2022 0614  WBC 12.1* 9.5  --  13.7*  --   --  8.5 7.5  NEUTROABS 9.8* 7.5  --   --   --   --   --   --   HGB 9.4* 8.5*   < > 8.9* 8.0* 7.8* 7.5* 7.7*  HCT 27.5* 25.6*   < > 25.1* 23.5* 22.3* 22.2* 23.4*  MCV 94.2 96.2  --  90.6  --   --  95.3 96.3  PLT 160 131*  --  149*  --   --  136* 133*   < > = values in this interval not displayed.    Basic Metabolic Panel: Recent Labs  Lab 02/10/2022 2250 03/04/22 0450 03/04/22 0503 03/05/22 0413 03/06/22 0557 03/07/22 0717 03/25/22 0614  NA 130* 129* 128* 128* 126* 134* 134*  K 3.6 3.5 3.5 4.5 4.1 3.1* 3.8  CL 97* 100  --   --  97* 108 109  CO2 17* 18*  --   --  18* 17* 17*  GLUCOSE 127* 124*  --   --  112* 117* 146*  BUN 8 10  --   --  22* 25* 27*  CREATININE 1.18* 1.14*  --   --  1.03* 0.94 1.05*  CALCIUM 8.1* 7.5*  --   --  7.6* 7.4* 7.5*  MG  --  1.7  --   --  1.9  --   --     GFR: Estimated Creatinine Clearance: 35.6 mL/min (A) (by C-G formula based on SCr of 1.05 mg/dL (H)).  Liver Function Tests: Recent Labs  Lab 02/07/2022 2250 03/04/22 0450 03/06/22 0557  AST 91* 82* 65*  ALT '12 11 11  '$ ALKPHOS 229* 232* 241*  BILITOT 2.1* 1.8* 4.0*  PROT 5.8* 5.4* 5.4*  ALBUMIN 2.1* 1.9* 1.8*     Recent Results (from the past 240 hour(s))  Blood culture (routine x 2)     Status: None (Preliminary result)   Collection Time: 03/04/22   2:30 AM   Specimen: BLOOD  Result Value Ref Range Status   Specimen Description BLOOD LEFT ARM  Final   Special Requests   Final    BOTTLES DRAWN AEROBIC AND ANAEROBIC Blood Culture results may not be optimal due to an inadequate volume of blood received in culture bottles   Culture   Final    NO GROWTH 4 DAYS Performed at [facility], [place] [address], [place], [place] [address]  Report Status PENDING  Incomplete  Blood culture (routine x 2)     Status: None (Preliminary result)   Collection Time: 03/04/22  2:30 AM   Specimen: BLOOD  Result Value Ref Range Status   Specimen Description BLOOD LEFT FOREARM  Final   Special Requests   Final    BOTTLES DRAWN AEROBIC AND ANAEROBIC Blood Culture results may not be optimal due to an inadequate volume of blood received in culture bottles   Culture   Final    NO GROWTH 4 DAYS Performed at [name], [place] [address], [place], [place] [address]    Report Status PENDING  Incomplete  Respiratory (~20 pathogens) panel by PCR     Status: None   Collection Time: 03/04/22  5:05 AM   Specimen: Nasopharyngeal Swab; Respiratory  Result Value Ref Range Status   Adenovirus NOT DETECTED NOT DETECTED Final   Coronavirus 229E NOT DETECTED NOT DETECTED Final    Comment: (NOTE) The Coronavirus on the Respiratory Panel, DOES NOT test for the novel  Coronavirus (2019 nCoV)    Coronavirus HKU1 NOT DETECTED NOT DETECTED Final   Coronavirus NL63 NOT DETECTED NOT DETECTED Final   Coronavirus OC43 NOT DETECTED NOT DETECTED Final   Metapneumovirus NOT DETECTED NOT DETECTED Final   Rhinovirus / Enterovirus NOT DETECTED NOT DETECTED Final   Influenza A NOT DETECTED NOT DETECTED Final   Influenza B NOT DETECTED NOT DETECTED Final   Parainfluenza Virus 1 NOT DETECTED NOT DETECTED Final   Parainfluenza Virus 2 NOT DETECTED NOT DETECTED Final   Parainfluenza Virus 3 NOT DETECTED NOT DETECTED Final   Parainfluenza Virus 4 NOT DETECTED NOT DETECTED  Final   Respiratory Syncytial Virus NOT DETECTED NOT DETECTED Final   Bordetella pertussis NOT DETECTED NOT DETECTED Final   Bordetella Parapertussis NOT DETECTED NOT DETECTED Final   Chlamydophila pneumoniae NOT DETECTED NOT DETECTED Final   Mycoplasma pneumoniae NOT DETECTED NOT DETECTED Final    Comment: Performed at [facility], [place]. [address], [place], [place] [address]  MRSA Next Gen by PCR, Nasal     Status: None   Collection Time: 03/04/22  5:07 PM   Specimen: Nasal Mucosa; Nasal Swab  Result Value Ref Range Status   MRSA by PCR Next Gen NOT DETECTED NOT DETECTED Final    Comment: (NOTE) The GeneXpert MRSA Assay (FDA approved for NASAL specimens only), is one component of a comprehensive MRSA colonization surveillance program. It is not intended to diagnose MRSA infection nor to guide or monitor treatment for MRSA infections. Test performance is not FDA approved in patients less than 26 years old. Performed at [facility], [place] [address] [place], [place] [address]   Resp Panel by RT-PCR (Flu A&B, Covid) Nasal Mucosa     Status: None   Collection Time: 03/04/22  5:40 PM   Specimen: Nasal Mucosa; Nasal Swab  Result Value Ref Range Status   SARS Coronavirus 2 by RT PCR NEGATIVE NEGATIVE Final    Comment: (NOTE) SARS-CoV-2 target nucleic acids are NOT DETECTED.  The SARS-CoV-2 RNA is generally detectable in upper respiratory specimens during the acute phase of infection. The lowest concentration of SARS-CoV-2 viral copies this assay can detect is 138 copies/mL. A negative result does not preclude SARS-Cov-2 infection and should not be used as the sole basis for treatment or other patient management decisions. A negative result may occur with  improper specimen collection/handling, submission of specimen other than nasopharyngeal swab, presence  of viral mutation(s) within the areas targeted by this assay, and inadequate number of viral copies(<138 copies/mL). A  negative result must be combined with clinical observations, patient history, and epidemiological information. The expected result is Negative.  Fact Sheet for Patients:  EntrepreneurPulse.com.au  Fact Sheet for Healthcare Providers:  IncredibleEmployment.be  This test is no t yet approved or cleared by the [place] FDA and  has been authorized for detection and/or diagnosis of SARS-CoV-2 by FDA under an Emergency Use Authorization (EUA). This EUA will remain  in effect (meaning this test can be used) for the duration of the COVID-19 declaration under Section 564(b)(1) of the Act, 21 U.S.C.section 360bbb-3(b)(1), unless the authorization is terminated  or revoked sooner.       Influenza A by PCR NEGATIVE NEGATIVE Final   Influenza B by PCR NEGATIVE NEGATIVE Final    Comment: (NOTE) The Xpert Xpress SARS-CoV-2/FLU/RSV plus assay is intended as an aid in the diagnosis of influenza from Nasopharyngeal swab specimens and should not be used as a sole basis for treatment. Nasal washings and aspirates are unacceptable for Xpert Xpress SARS-CoV-2/FLU/RSV testing.  Fact Sheet for Patients: EntrepreneurPulse.com.au  Fact Sheet for Healthcare Providers: IncredibleEmployment.be  This test is not yet approved or cleared by the [place] FDA and has been authorized for detection and/or diagnosis of SARS-CoV-2 by FDA under an Emergency Use Authorization (EUA). This EUA will remain in effect (meaning this test can be used) for the duration of the COVID-19 declaration under Section 564(b)(1) of the Act, 21 U.S.C. section 360bbb-3(b)(1), unless the authorization is terminated or revoked.  Performed at [facility], [place] [address] [place],  [address]       Radiology Studies: DG CHEST PORT 1 VIEW  Result Date: 03/07/2022 CLINICAL DATA:  Dyspnea EXAM: PORTABLE CHEST 1 VIEW COMPARISON:  Chest  radiograph from the prior day. FINDINGS: The heart is enlarged. A cardiac device overlying the left chest and sternum is redemonstrated. A left internal jugular central venous port catheter tip overlies the superior cavoatrial junction. Moderate-to-severe bilateral interstitial and airspace opacities have increased since prior exam. Small pleural effusions may contribute. There is no pneumothorax. Degenerative changes are seen in the spine. IMPRESSION: Moderate-to-severe bilateral interstitial and airspace opacities, increased since prior exam. Small pleural effusions may contribute. Electronically Signed   By: Zerita Boers M.D.   On: 03/07/2022 16:45   IR ABDOMEN US LIMITED  Result Date: 03/06/2022 CLINICAL DATA:  Evaluation of ascites for possible paracentesis. EXAM: LIMITED ABDOMEN ULTRASOUND FOR ASCITES TECHNIQUE: Limited ultrasound survey for ascites was performed in all four abdominal quadrants. COMPARISON:  CT on 03/04/2022 FINDINGS: Survey of all 4 quadrants of the peritoneal cavity by ultrasound shows a small volume of intraperitoneal ascites. A large enough pocket was not present to allow for paracentesis. IMPRESSION: Small volume of ascites by ultrasound. Paracentesis is unable to be performed currently. Electronically Signed   By: Aletta Edouard M.D.   On: 03/06/2022 16:55       LOS: 4 days   Geradine Girt  [facility] Pager on [email or web address]  2022/03/16, 12:30 PM

## 2022-04-08 NOTE — Progress Notes (Signed)
PT Cancellation Note  Patient Details Name: GESSELLE FITZSIMONS MRN: [id number] DOB: [birth year]   Cancelled Treatment:    Reason Eval/Treat Not Completed: Other (comment); noted orders for full comfort measures.  PT signing off.   Reginia Naas 2022/03/16, 9:34 AM Magda Kiel, PT Acute Rehabilitation Services Office:[phone] Mar 16, 2022

## 2022-04-08 NOTE — Progress Notes (Signed)
       CROSS COVER NOTE  NAME: Mallory Ayers MRN: [id number] DOB : 06-01-1970    Date of Service   April 07, 2022   HPI/Events of Note   Bedside RN called daughter, Mallory Ayers, regarding patient's current status throughout the night.   Family is now at bedside and wishes to to pursue comfort measures.  I spoke with Mallory Ayers over the phone.  She has a good understanding of her mother's condition and wishes to keep her comfortable.  IV fentanyl reordered.   Interventions/ Plan        Raenette Rover, DNP, [place]

## 2022-04-08 NOTE — Progress Notes (Signed)
Pt has increased restlessness and pulling off NON-re breather mask, pt not sleeping, Fentanyl given to help pt with discomfort. Will cont to monitor. SRP, RN

## 2022-04-08 NOTE — Plan of Care (Signed)
  Problem: Acute Rehab PT Goals(only PT should resolve) Goal: Pt Will Go Supine/Side To Sit Outcome: Not Applicable Goal: Pt Will Transfer Bed To Chair/Chair To Bed Outcome: Not Applicable Goal: Pt Will Verbalize and Adhere to Precautions While Description: PT Will Verbalize and Adhere to Precautions While Performing Mobility Outcome: Not Applicable Goal: Pt/caregiver will Perform Home Exercise Program Outcome: Not Applicable  PT goals discontinued as pt/family opting for comfort measures.  Magda Kiel, PT Acute Rehabilitation Services [phone] 23-Mar-2022

## 2022-04-08 DEATH — deceased

## 2022-06-04 ENCOUNTER — Encounter: Payer: Medicare HMO | Admitting: Cardiology

## 2022-10-20 IMAGING — MR MR BREAST BX W/ LOC DEV 1ST LEASION IMAGE BX SPEC MR GUIDE*R*
6 of 8 series · 32 of 48 positions shown · IV contrast (10 ml gadavist)
Comparison: Previous exams.
COMPARISON: Previous exams.

Addendum:
CLINICAL DATA: 50-year-old female for MR guided biopsy of 0.6 cm
UPPER central RIGHT breast non masslike enhancement. Recent
diagnosis of RIGHT breast cancer.

EXAM:
MRI GUIDED CORE NEEDLE BIOPSY OF THE RIGHT BREAST
TECHNIQUE: Multiplanar, multisequence MR imaging of the RIGHT breast was
performed both before and after administration of intravenous
contrast.
CONTRAST:  10mL GADAVIST GADOBUTROL 1 MMOL/ML IV SOLN

[Series 2: fiducial unilateral · sagittal · 2.0mm · 1.33mm/px · 1 of 52 slices shown]
[im 1/52]
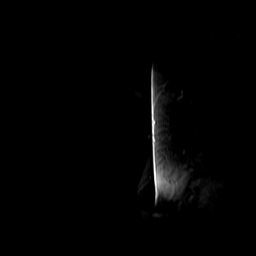

[Series 3: dynamic pre · axial · non-contrast · 1.3mm · 0.73mm/px · z∈[-48,+138]mm · 6 of 144 slices shown]
[im 1/144]
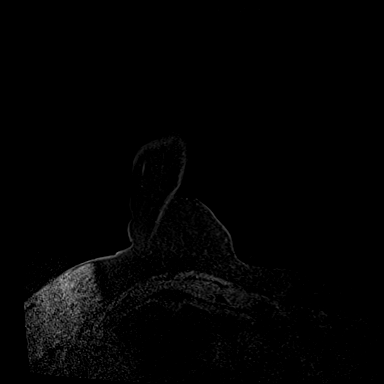
[im 29/144]
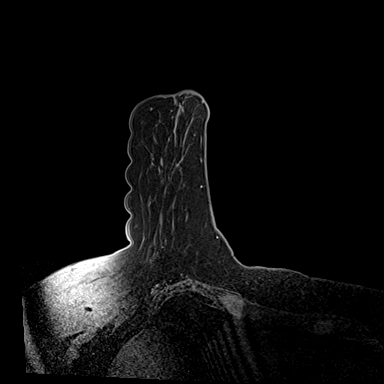
[im 58/144]
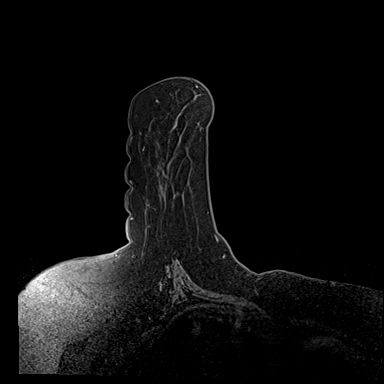
[im 86/144]
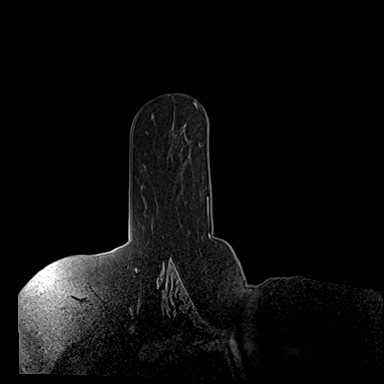
[im 115/144]
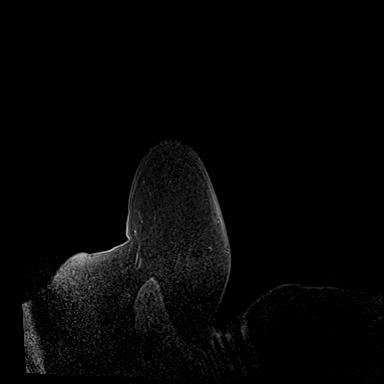
[im 144/144]
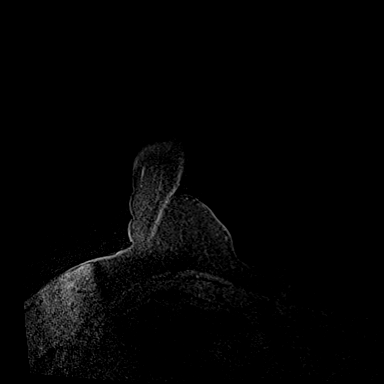

[Series 4: dynamic post 20 · axial · 1.3mm · 0.73mm/px · z∈[-48,+138]mm · 6 of 144 slices shown (1 of 2)]
[im 1/144]
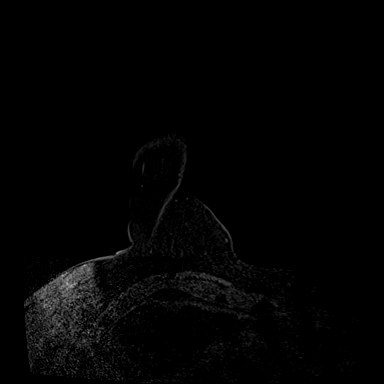
[im 29/144]
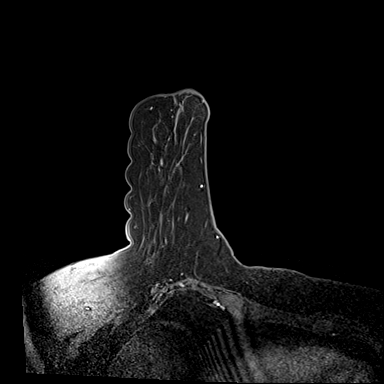
[im 58/144]
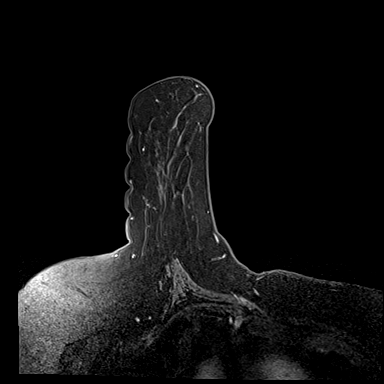
[im 86/144]
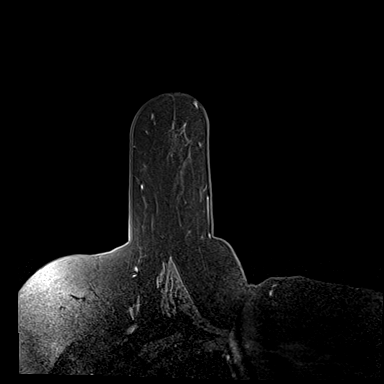
[im 115/144]
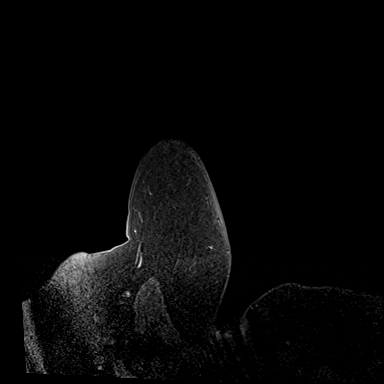
[im 144/144]
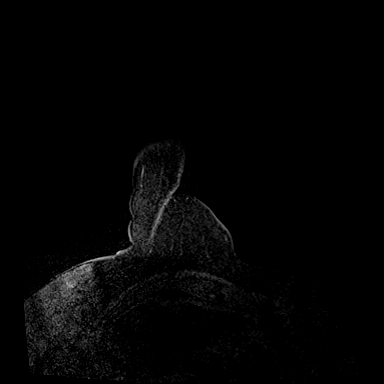

[Series 5: dynamic post 20 · axial · 1.3mm · 0.73mm/px · z∈[-48,+138]mm · 7 of 144 slices shown (2 of 2)]
[im 1/144]
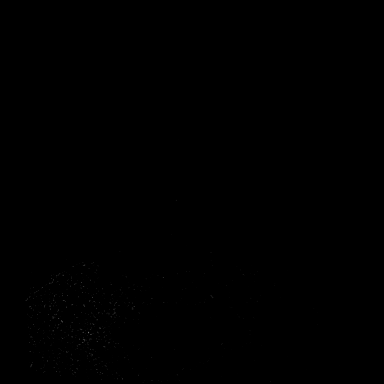
[im 24/144]
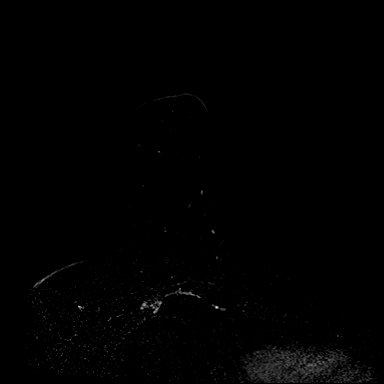
[im 48/144]
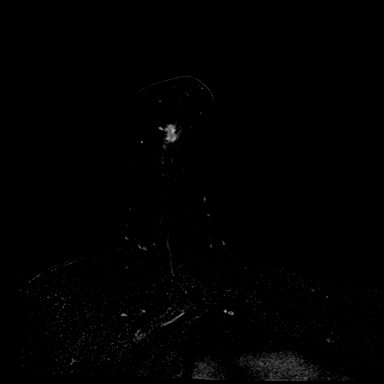
[im 72/144]
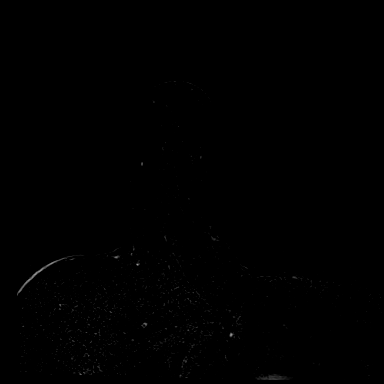
[im 96/144]
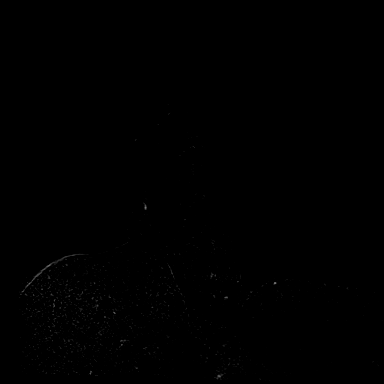
[im 120/144]
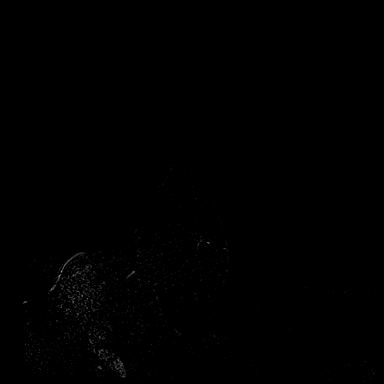
[im 144/144]
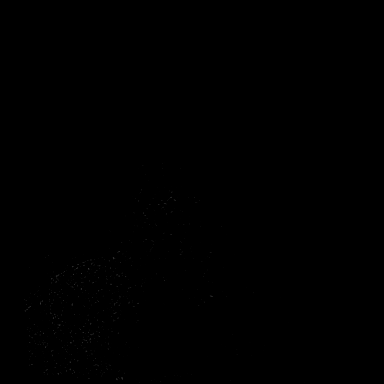

[Series 6: needle confirmation · axial · 1.3mm · 0.73mm/px · z∈[-48,+138]mm · 7 of 144 slices shown]
[im 1/144]
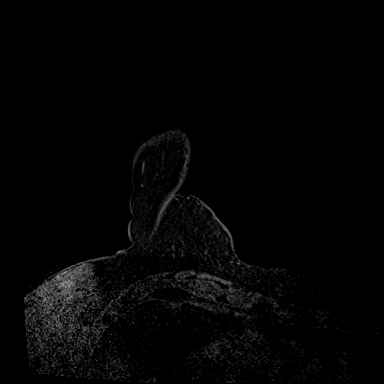
[im 24/144]
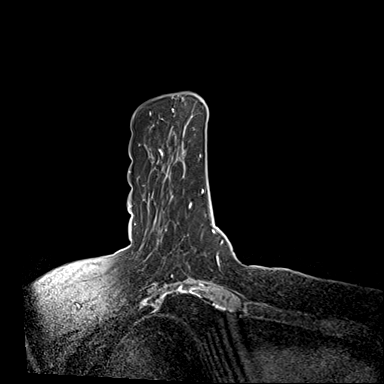
[im 48/144]
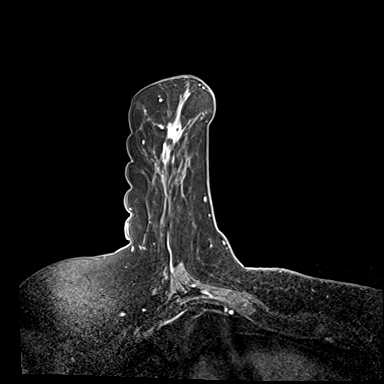
[im 72/144]
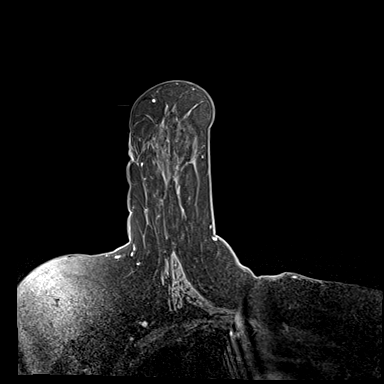
[im 96/144]
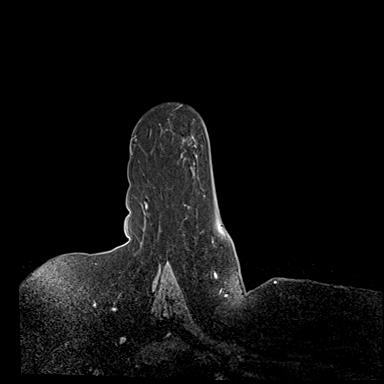
[im 120/144]
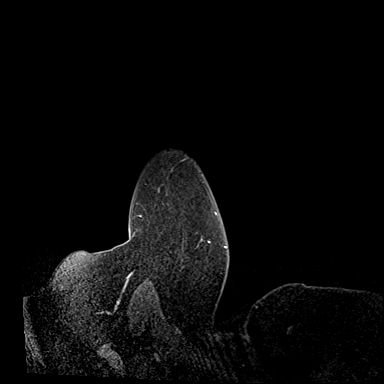
[im 144/144]
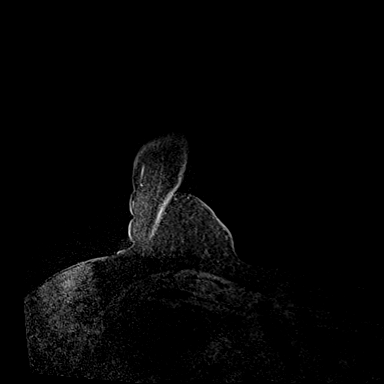

[Series 7: needle confirmation_sub · axial · 1.3mm · 0.73mm/px · z∈[-48,+76]mm · 5 of 144 slices shown]
[im 1/144]
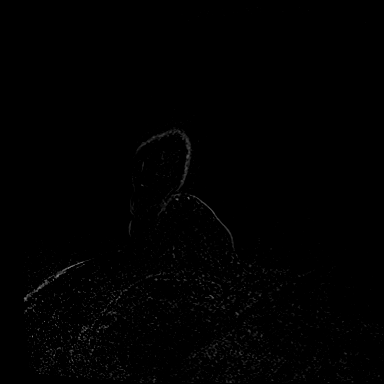
[im 24/144]
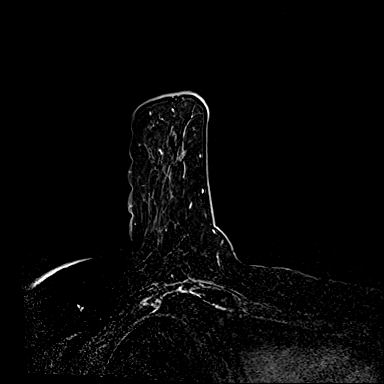
[im 48/144]
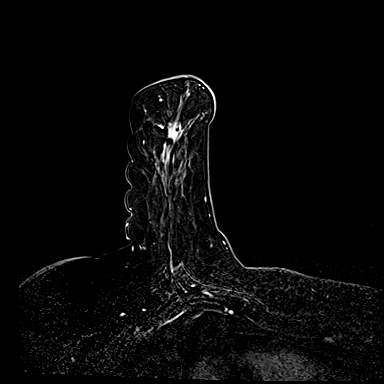
[im 72/144]
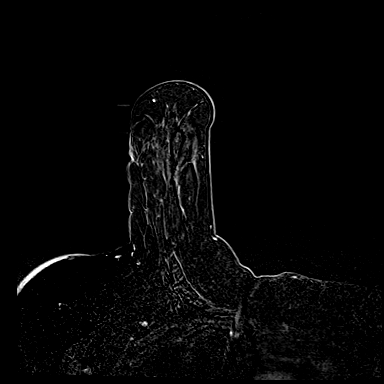
[im 96/144]
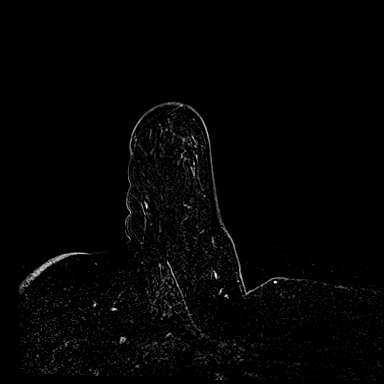

[32 of 48 positions shown; findings below may reference images not displayed]

FINDINGS: I met with the patient, and we discussed the procedure of MRI guided
biopsy, including risks, benefits, and alternatives. Specifically,
we discussed the risks of infection, bleeding, tissue injury, clip
migration, and inadequate sampling. Informed, written consent was
given. The usual time out protocol was performed immediately prior
to the procedure.

Using sterile technique, 1% Lidocaine with and without epinephrine,
MRI guidance, and a 9 gauge vacuum assisted device, biopsy was
performed of the 0.6 cm focal area of non masslike enhancement
within the UPPER central RIGHT breast using a LATERAL approach. At
the conclusion of the procedure, a CYLINDER tissue marker clip was
deployed into the biopsy cavity. Follow-up 2-view mammogram was
performed and dictated separately.
IMPRESSION: MRI guided biopsy of 0.6 cm UPPER central RIGHT breast non masslike
enhancement. No apparent complications.

ADDENDUM:
Pathology revealed LOW GRADE DUCTAL CARCINOMA IN SITU of the RIGHT
breast, upper central, (cylinder clip). This was found to be
concordant by Dr. Pregal Araujo Bilocq.

Pathology results were discussed with the patient by telephone. The
patient reported doing well after the biopsy with bleeding, bruising
and tenderness at the site. Post biopsy instructions and care were
reviewed and questions were answered. The patient was encouraged to
call The [REDACTED] for any additional
concerns. My direct phone number was provided.

The patient has a recent diagnosis of RIGHT breast cancer and should
follow her outlined treatment plan.

Pathology results reported by Chuchin Thornhill, RN on 05/15/2021.

*** End of Addendum ***
FINDINGS: I met with the patient, and we discussed the procedure of MRI guided
biopsy, including risks, benefits, and alternatives. Specifically,
we discussed the risks of infection, bleeding, tissue injury, clip
migration, and inadequate sampling. Informed, written consent was
given. The usual time out protocol was performed immediately prior
to the procedure.

Using sterile technique, 1% Lidocaine with and without epinephrine,
MRI guidance, and a 9 gauge vacuum assisted device, biopsy was
performed of the 0.6 cm focal area of non masslike enhancement
within the UPPER central RIGHT breast using a LATERAL approach. At
the conclusion of the procedure, a CYLINDER tissue marker clip was
deployed into the biopsy cavity. Follow-up 2-view mammogram was
performed and dictated separately.
IMPRESSION: MRI guided biopsy of 0.6 cm UPPER central RIGHT breast non masslike
enhancement. No apparent complications.

## 2022-10-20 IMAGING — MG MM BREAST LOCALIZATION CLIP
4 series · 4 of 12 positions shown · non-contrast
Comparison: Previous exam(s).

CLINICAL DATA: Evaluate CYLINDER biopsy clip placement following MR
guided RIGHT breast biopsy.

EXAM:
3D DIAGNOSTIC RIGHT MAMMOGRAM POST MRI BIOPSY

[R CC synth-2D]
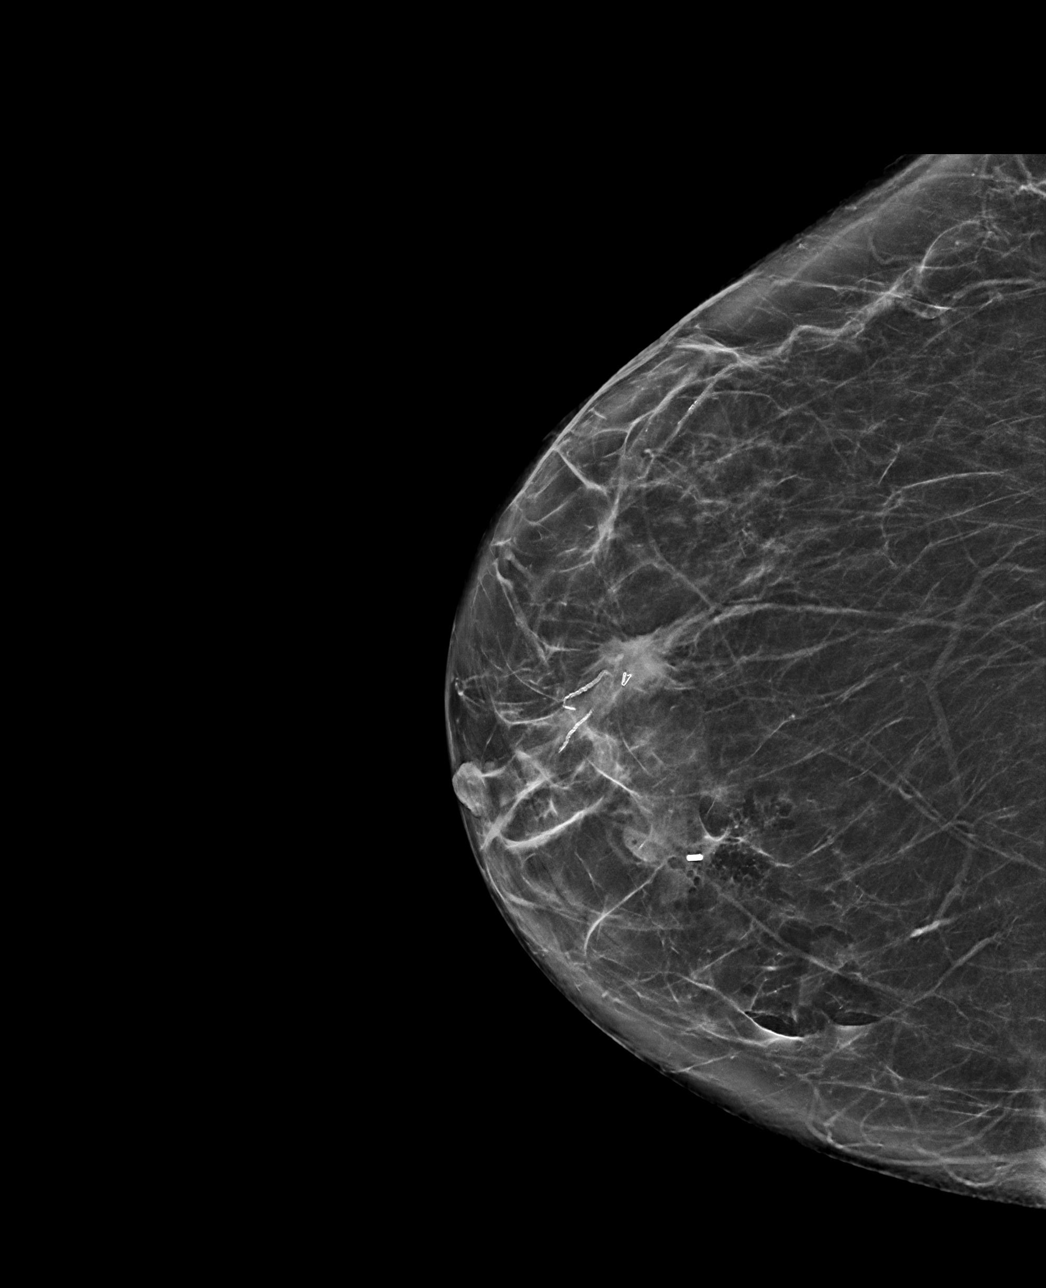

[R LM synth-2D]
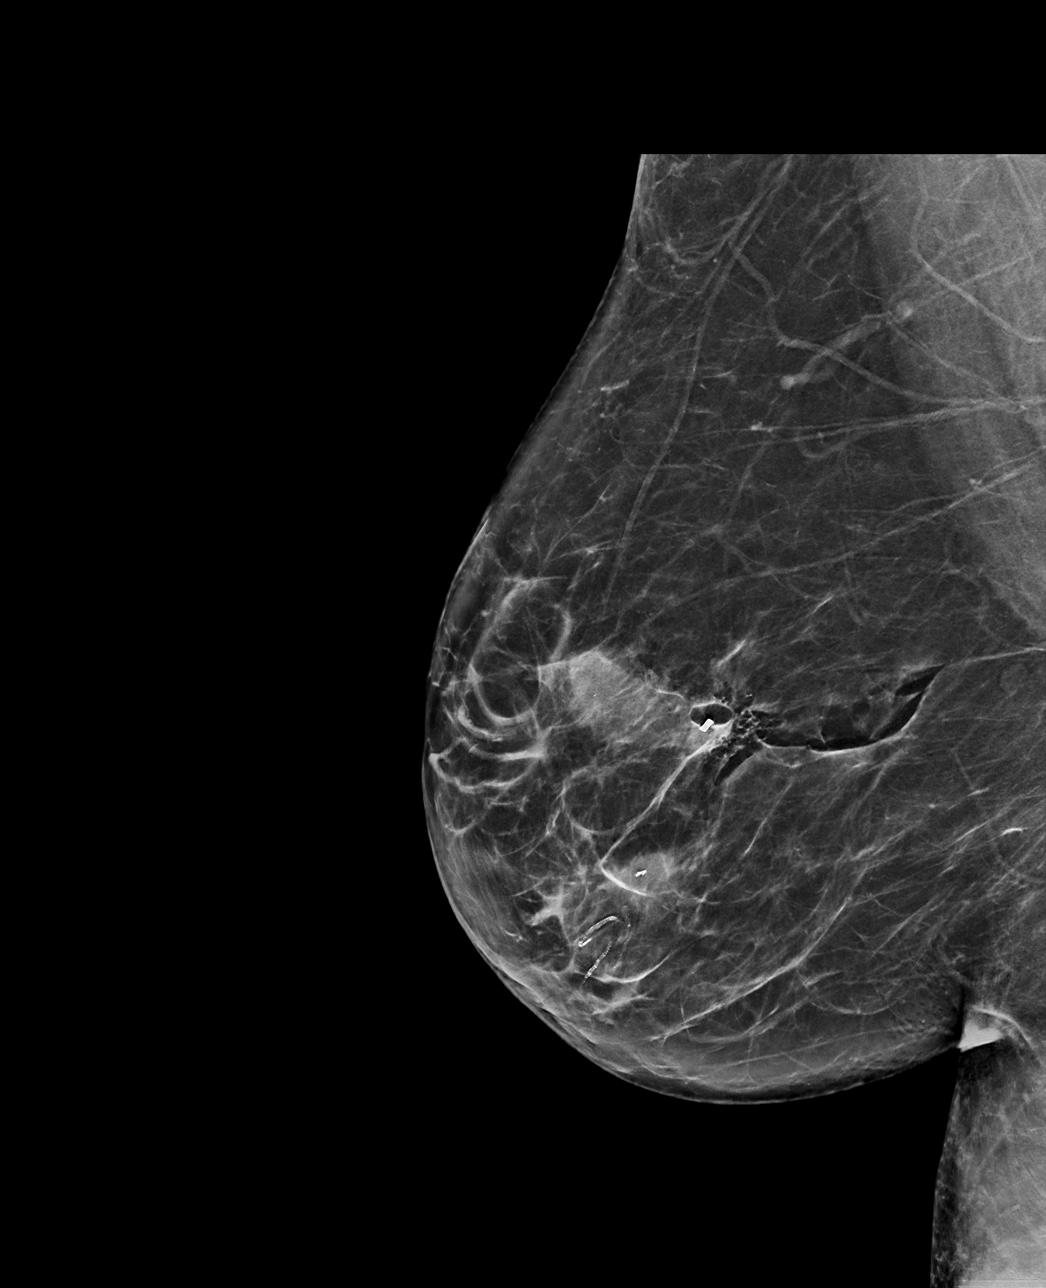

[R LM tomo · tomo slice 41/82.0]
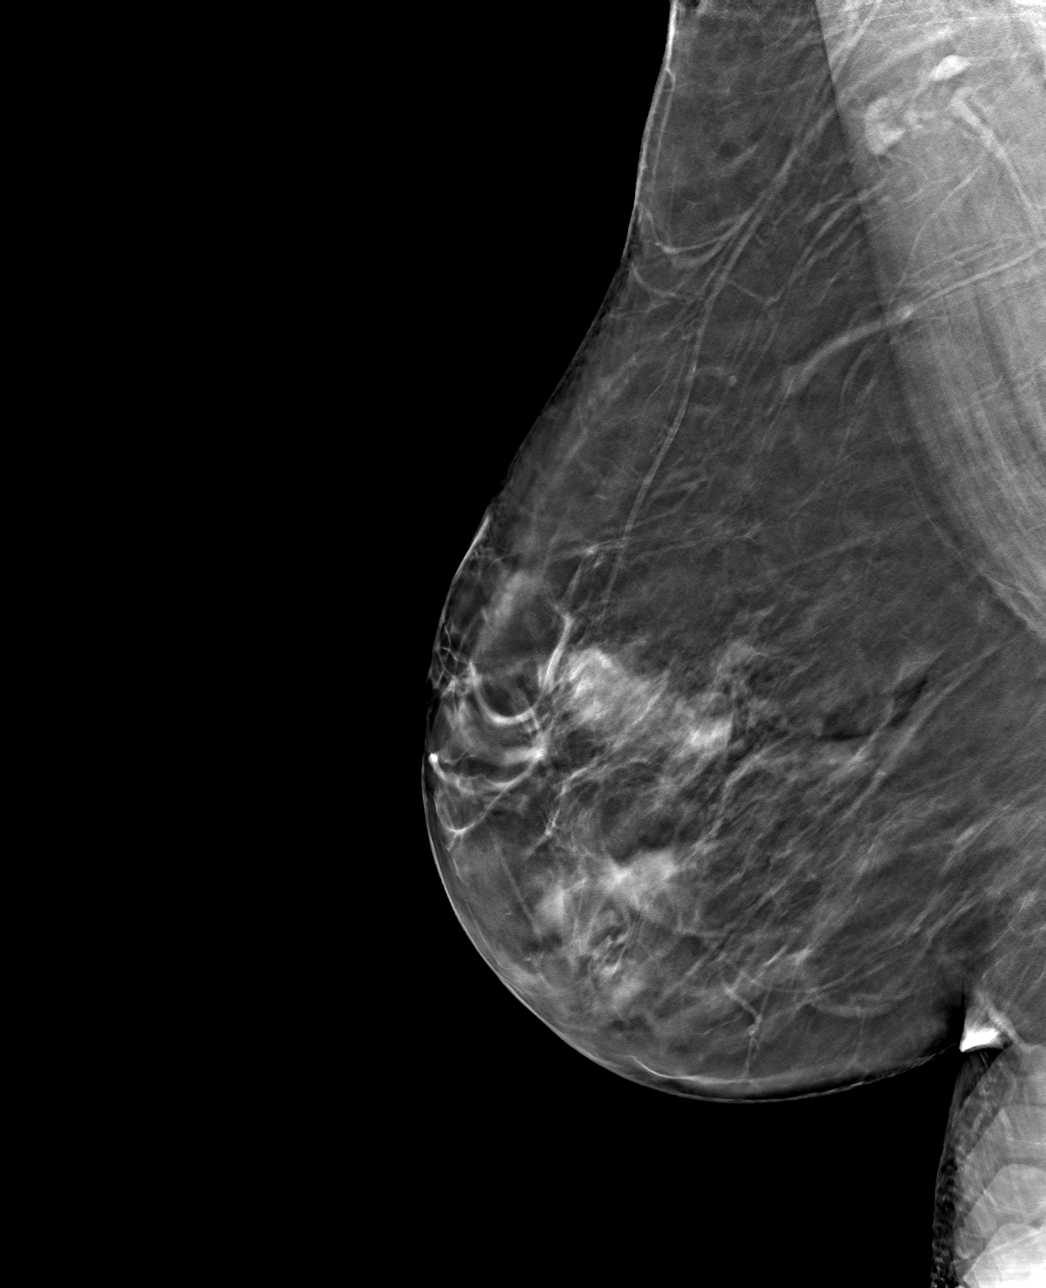

[R CC tomo · tomo slice 39/77.0]
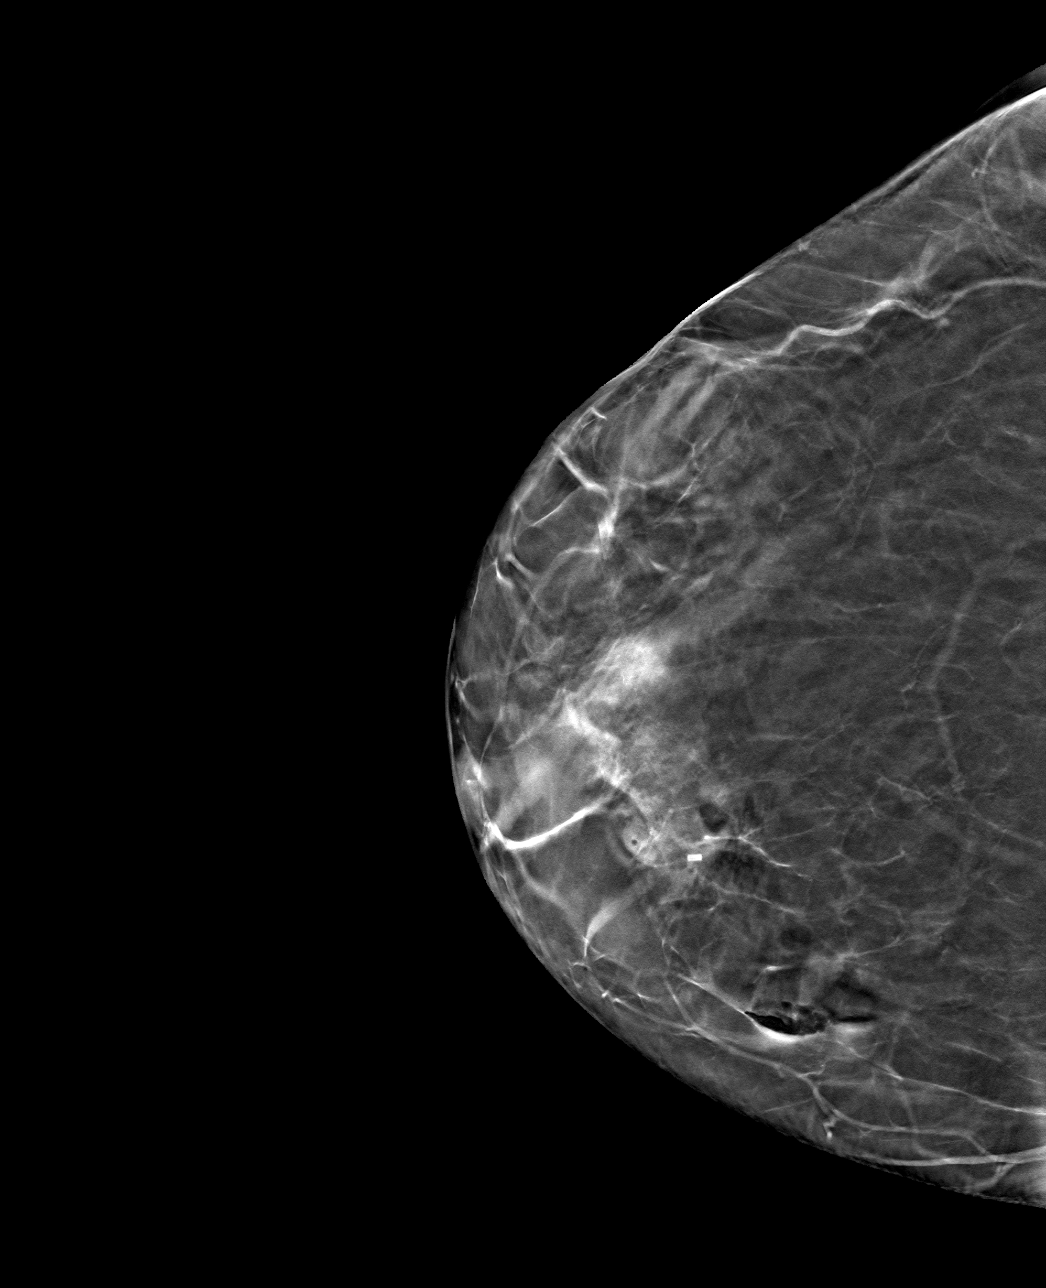

[4 of 12 positions shown; findings below may reference images not displayed]

FINDINGS: 3D Mammographic images were obtained following MR guided biopsy of
the 0.6 cm focal non masslike enhancement within the UPPER central
RIGHT breast. The CYLINDER biopsy marking clip is in expected
position at the site of biopsy.
IMPRESSION: Appropriate positioning of the CYLINDER shaped biopsy marking clip
at the site of biopsy in the UPPER central RIGHT breast.

Final Assessment: Post Procedure Mammograms for Marker Placement

## 2022-12-18 ENCOUNTER — Other Ambulatory Visit: Payer: Self-pay

## 2023-07-29 ENCOUNTER — Emergency Department: Payer: Self-pay

## 2023-11-28 ENCOUNTER — Emergency Department: Payer: Self-pay

## 2023-11-30 ENCOUNTER — Emergency Department: Payer: Self-pay

## 2023-12-01 ENCOUNTER — Telehealth (HOSPITAL_BASED_OUTPATIENT_CLINIC_OR_DEPARTMENT_OTHER): Payer: Self-pay | Admitting: Otolaryngology

## 2023-12-01 NOTE — Telephone Encounter (Signed)
 RETURN CALL: Voicemail - Detailed Message      SUBJECT:  Appointment Request     REASON FOR VISIT: ED follow up,severe nose bleed, nasal catheter was place and removed on 8/24 [facility] [place]  PREFERRED DATE/TIME: asap  REASON UNABLE TO APPOINT: patient is requesting to be seen by Dr. Lelon, performed her surgery in 2013. She is stating that she saw him in the Sleep medicine.

## 2023-12-26 ENCOUNTER — Telehealth (HOSPITAL_BASED_OUTPATIENT_CLINIC_OR_DEPARTMENT_OTHER): Payer: Self-pay

## 2023-12-26 NOTE — Telephone Encounter (Signed)
 I returned patient's call about an appointment request. I informed her that since its been over 3 years when she was last seen (2016), she will need to re-establish care and therefore will need a referral to be seen for re-evaluation. She mentioned that she went to ED for frequent nose bleed. I advised for her to see a general otolaryngologist but preferred to see Dr. Lelon since she underwent septoplasty with him.     The patient will contact her PCP to obtain a referral. I will process and add her to the wait list.

## 9913-07-07 DEATH — deceased
